# Patient Record
Sex: Male | Born: 1945
Health system: Southern US, Community
[De-identification: ages and names within clinical notes are randomized; demographics above are authoritative.]

## PROBLEM LIST (undated history)

## (undated) DIAGNOSIS — G459 Transient cerebral ischemic attack, unspecified: Secondary | ICD-10-CM

## (undated) HISTORY — PX: SHOULDER ARTHROSCOPY: SHX128

---

## 2003-11-16 ENCOUNTER — Other Ambulatory Visit: Payer: Self-pay

## 2004-06-30 ENCOUNTER — Ambulatory Visit: Payer: Self-pay | Admitting: Unknown Physician Specialty

## 2004-07-07 ENCOUNTER — Ambulatory Visit: Payer: Self-pay | Admitting: Unknown Physician Specialty

## 2004-07-15 ENCOUNTER — Other Ambulatory Visit: Payer: Self-pay

## 2004-07-16 ENCOUNTER — Inpatient Hospital Stay: Payer: Self-pay | Admitting: Surgery

## 2013-11-02 ENCOUNTER — Observation Stay: Payer: Self-pay | Admitting: Internal Medicine

## 2013-11-02 LAB — CBC WITH DIFFERENTIAL/PLATELET
Basophil #: 0 10*3/uL (ref 0.0–0.1)
Basophil %: 0.3 %
Eosinophil #: 0 10*3/uL (ref 0.0–0.7)
Eosinophil %: 0.2 %
HCT: 42.5 % (ref 40.0–52.0)
HGB: 14.6 g/dL (ref 13.0–18.0)
Lymphocyte #: 1.6 10*3/uL (ref 1.0–3.6)
Lymphocyte %: 36.1 %
MCH: 34.4 pg — ABNORMAL HIGH (ref 26.0–34.0)
MCHC: 34.3 g/dL (ref 32.0–36.0)
MCV: 100 fL (ref 80–100)
Monocyte #: 0.4 x10 3/mm (ref 0.2–1.0)
Monocyte %: 9.3 %
Neutrophil #: 2.4 10*3/uL (ref 1.4–6.5)
Neutrophil %: 54.1 %
PLATELETS: 204 10*3/uL (ref 150–440)
RBC: 4.23 10*6/uL — AB (ref 4.40–5.90)
RDW: 12.3 % (ref 11.5–14.5)
WBC: 4.5 10*3/uL (ref 3.8–10.6)

## 2013-11-02 LAB — COMPREHENSIVE METABOLIC PANEL
ANION GAP: 7 (ref 7–16)
Albumin: 4 g/dL (ref 3.4–5.0)
Alkaline Phosphatase: 22 U/L — ABNORMAL LOW
BILIRUBIN TOTAL: 0.7 mg/dL (ref 0.2–1.0)
BUN: 21 mg/dL — AB (ref 7–18)
Calcium, Total: 9 mg/dL (ref 8.5–10.1)
Chloride: 105 mmol/L (ref 98–107)
Co2: 26 mmol/L (ref 21–32)
Creatinine: 1.15 mg/dL (ref 0.60–1.30)
EGFR (Non-African Amer.): >60
Glucose: 117 mg/dL — ABNORMAL HIGH (ref 65–99)
OSMOLALITY: 280 (ref 275–301)
Potassium: 4.3 mmol/L (ref 3.5–5.1)
SGOT(AST): 41 U/L — ABNORMAL HIGH (ref 15–37)
SGPT (ALT): 46 U/L (ref 12–78)
SODIUM: 138 mmol/L (ref 136–145)
Total Protein: 7.1 g/dL (ref 6.4–8.2)

## 2013-11-02 LAB — APTT: Activated PTT: 32.1 secs (ref 23.6–35.9)

## 2013-11-03 LAB — TSH: THYROID STIMULATING HORM: 2.52 u[IU]/mL

## 2013-11-03 LAB — HEPATIC FUNCTION PANEL A (ARMC)
ALBUMIN: 3.6 g/dL (ref 3.4–5.0)
ALK PHOS: 20 U/L — AB
AST: 29 U/L (ref 15–37)
BILIRUBIN TOTAL: 0.6 mg/dL (ref 0.2–1.0)
Bilirubin, Direct: 0.1 mg/dL (ref 0.00–0.20)
SGPT (ALT): 40 U/L (ref 12–78)
TOTAL PROTEIN: 6.5 g/dL (ref 6.4–8.2)

## 2013-11-03 LAB — BASIC METABOLIC PANEL
ANION GAP: 6 — AB (ref 7–16)
BUN: 22 mg/dL — AB (ref 7–18)
CHLORIDE: 107 mmol/L (ref 98–107)
Calcium, Total: 8.8 mg/dL (ref 8.5–10.1)
Co2: 26 mmol/L (ref 21–32)
Creatinine: 1.15 mg/dL (ref 0.60–1.30)
GLUCOSE: 95 mg/dL (ref 65–99)
Osmolality: 281 (ref 275–301)
POTASSIUM: 3.8 mmol/L (ref 3.5–5.1)
Sodium: 139 mmol/L (ref 136–145)

## 2013-11-03 LAB — LIPID PANEL
CHOLESTEROL: 238 mg/dL — AB (ref 0–200)
HDL Cholesterol: 31 mg/dL — ABNORMAL LOW (ref 40–60)
LDL CHOLESTEROL, CALC: 157 mg/dL — AB (ref 0–100)
TRIGLYCERIDES: 250 mg/dL — AB (ref 0–200)
VLDL CHOLESTEROL, CALC: 50 mg/dL — AB (ref 5–40)

## 2013-11-03 LAB — HEMOGLOBIN A1C: HEMOGLOBIN A1C: 5.3 % (ref 4.2–6.3)

## 2014-06-08 ENCOUNTER — Inpatient Hospital Stay: Payer: Self-pay | Admitting: Internal Medicine

## 2014-06-08 LAB — BASIC METABOLIC PANEL
Anion Gap: 5 — ABNORMAL LOW (ref 7–16)
BUN: 34 mg/dL — AB (ref 7–18)
CHLORIDE: 107 mmol/L (ref 98–107)
Calcium, Total: 8.2 mg/dL — ABNORMAL LOW (ref 8.5–10.1)
Co2: 31 mmol/L (ref 21–32)
Creatinine: 1.25 mg/dL (ref 0.60–1.30)
EGFR (African American): >60
EGFR (Non-African Amer.): >60
Glucose: 133 mg/dL — ABNORMAL HIGH (ref 65–99)
OSMOLALITY: 295 (ref 275–301)
Potassium: 3.9 mmol/L (ref 3.5–5.1)
SODIUM: 143 mmol/L (ref 136–145)

## 2014-06-08 LAB — CBC
HCT: 26.7 % — AB (ref 40.0–52.0)
HGB: 9.2 g/dL — AB (ref 13.0–18.0)
MCH: 35.8 pg — ABNORMAL HIGH (ref 26.0–34.0)
MCHC: 34.2 g/dL (ref 32.0–36.0)
MCV: 105 fL — AB (ref 80–100)
Platelet: 224 10*3/uL (ref 150–440)
RBC: 2.56 10*6/uL — AB (ref 4.40–5.90)
RDW: 12.6 % (ref 11.5–14.5)
WBC: 6.2 10*3/uL (ref 3.8–10.6)

## 2014-06-08 LAB — PROTIME-INR
INR: 1
PROTHROMBIN TIME: 12.8 s (ref 11.5–14.7)

## 2014-06-08 LAB — HEMOGLOBIN: HGB: 8.7 g/dL — ABNORMAL LOW (ref 13.0–18.0)

## 2014-06-08 LAB — TROPONIN I: Troponin-I: <0.02 ng/mL

## 2014-06-08 LAB — APTT: Activated PTT: 26.1 secs (ref 23.6–35.9)

## 2014-06-09 LAB — CBC WITH DIFFERENTIAL/PLATELET
Basophil #: 0 10*3/uL (ref 0.0–0.1)
Basophil %: 0.1 %
EOS ABS: 0 10*3/uL (ref 0.0–0.7)
Eosinophil %: 0.3 %
HCT: 22.8 % — ABNORMAL LOW (ref 40.0–52.0)
HGB: 7.7 g/dL — ABNORMAL LOW (ref 13.0–18.0)
LYMPHS PCT: 30.2 %
Lymphocyte #: 1.5 10*3/uL (ref 1.0–3.6)
MCH: 35.1 pg — ABNORMAL HIGH (ref 26.0–34.0)
MCHC: 33.8 g/dL (ref 32.0–36.0)
MCV: 104 fL — ABNORMAL HIGH (ref 80–100)
Monocyte #: 0.4 x10 3/mm (ref 0.2–1.0)
Monocyte %: 8.7 %
NEUTROS ABS: 3.1 10*3/uL (ref 1.4–6.5)
Neutrophil %: 60.7 %
Platelet: 181 10*3/uL (ref 150–440)
RBC: 2.2 10*6/uL — AB (ref 4.40–5.90)
RDW: 12.8 % (ref 11.5–14.5)
WBC: 5.1 10*3/uL (ref 3.8–10.6)

## 2014-06-09 LAB — BASIC METABOLIC PANEL
Anion Gap: 7 (ref 7–16)
BUN: 28 mg/dL — AB (ref 7–18)
CALCIUM: 8.2 mg/dL — AB (ref 8.5–10.1)
CHLORIDE: 111 mmol/L — AB (ref 98–107)
CO2: 25 mmol/L (ref 21–32)
Creatinine: 1.15 mg/dL (ref 0.60–1.30)
EGFR (African American): >60
GLUCOSE: 96 mg/dL (ref 65–99)
OSMOLALITY: 290 (ref 275–301)
Potassium: 3.9 mmol/L (ref 3.5–5.1)
Sodium: 143 mmol/L (ref 136–145)

## 2014-06-09 LAB — HEMATOCRIT
HCT: 22.5 % — ABNORMAL LOW (ref 40.0–52.0)
HCT: 20.7 % — ABNORMAL LOW (ref 40.0–52.0)

## 2014-06-09 LAB — HEMOGLOBIN
HGB: 7.4 g/dL — ABNORMAL LOW (ref 13.0–18.0)
HGB: 7.4 g/dL — ABNORMAL LOW (ref 13.0–18.0)
HGB: 6.8 g/dL — AB (ref 13.0–18.0)

## 2014-06-10 LAB — BASIC METABOLIC PANEL
ANION GAP: 7 (ref 7–16)
BUN: 17 mg/dL (ref 7–18)
CHLORIDE: 114 mmol/L — AB (ref 98–107)
Calcium, Total: 7.5 mg/dL — ABNORMAL LOW (ref 8.5–10.1)
Co2: 25 mmol/L (ref 21–32)
Creatinine: 1.15 mg/dL (ref 0.60–1.30)
EGFR (African American): >60
EGFR (Non-African Amer.): >60
Glucose: 81 mg/dL (ref 65–99)
OSMOLALITY: 291 (ref 275–301)
POTASSIUM: 4 mmol/L (ref 3.5–5.1)
Sodium: 146 mmol/L — ABNORMAL HIGH (ref 136–145)

## 2014-06-10 LAB — CBC WITH DIFFERENTIAL/PLATELET
BASOS PCT: 0.2 %
Basophil #: 0 10*3/uL (ref 0.0–0.1)
EOS ABS: 0 10*3/uL (ref 0.0–0.7)
EOS PCT: 0.6 %
HCT: 20.3 % — AB (ref 40.0–52.0)
HGB: 6.9 g/dL — ABNORMAL LOW (ref 13.0–18.0)
LYMPHS PCT: 30.5 %
Lymphocyte #: 1.5 10*3/uL (ref 1.0–3.6)
MCH: 35.9 pg — ABNORMAL HIGH (ref 26.0–34.0)
MCHC: 34.2 g/dL (ref 32.0–36.0)
MCV: 105 fL — ABNORMAL HIGH (ref 80–100)
Monocyte #: 0.4 x10 3/mm (ref 0.2–1.0)
Monocyte %: 7.4 %
NEUTROS PCT: 61.3 %
Neutrophil #: 3 10*3/uL (ref 1.4–6.5)
Platelet: 183 10*3/uL (ref 150–440)
RBC: 1.93 10*6/uL — AB (ref 4.40–5.90)
RDW: 12.8 % (ref 11.5–14.5)
WBC: 4.9 10*3/uL (ref 3.8–10.6)

## 2014-06-10 LAB — HEMOGLOBIN: HGB: 8.3 g/dL — AB (ref 13.0–18.0)

## 2014-06-10 LAB — HEMATOCRIT: HCT: 24.4 % — ABNORMAL LOW (ref 40.0–52.0)

## 2014-06-11 LAB — CBC WITH DIFFERENTIAL/PLATELET
Basophil #: 0 10*3/uL (ref 0.0–0.1)
Basophil %: 0.2 %
EOS ABS: 0 10*3/uL (ref 0.0–0.7)
EOS PCT: 1 %
HCT: 22.1 % — ABNORMAL LOW (ref 40.0–52.0)
HGB: 7.6 g/dL — AB (ref 13.0–18.0)
LYMPHS ABS: 1.5 10*3/uL (ref 1.0–3.6)
LYMPHS PCT: 32.2 %
MCH: 33.7 pg (ref 26.0–34.0)
MCHC: 34.4 g/dL (ref 32.0–36.0)
MCV: 98 fL (ref 80–100)
MONOS PCT: 7.2 %
Monocyte #: 0.3 x10 3/mm (ref 0.2–1.0)
NEUTROS PCT: 59.4 %
Neutrophil #: 2.8 10*3/uL (ref 1.4–6.5)
Platelet: 195 10*3/uL (ref 150–440)
RBC: 2.26 10*6/uL — AB (ref 4.40–5.90)
RDW: 18.3 % — ABNORMAL HIGH (ref 11.5–14.5)
WBC: 4.7 10*3/uL (ref 3.8–10.6)

## 2014-06-11 LAB — BASIC METABOLIC PANEL
Anion Gap: 6 — ABNORMAL LOW (ref 7–16)
BUN: 16 mg/dL (ref 7–18)
Calcium, Total: 7.4 mg/dL — ABNORMAL LOW (ref 8.5–10.1)
Chloride: 113 mmol/L — ABNORMAL HIGH (ref 98–107)
Co2: 26 mmol/L (ref 21–32)
Creatinine: 1.09 mg/dL (ref 0.60–1.30)
EGFR (Non-African Amer.): >60
Glucose: 101 mg/dL — ABNORMAL HIGH (ref 65–99)
Osmolality: 290 (ref 275–301)
Potassium: 3.7 mmol/L (ref 3.5–5.1)
Sodium: 145 mmol/L (ref 136–145)

## 2014-06-12 LAB — CBC WITH DIFFERENTIAL/PLATELET
BASOS ABS: 0 10*3/uL (ref 0.0–0.1)
Basophil %: 0.2 %
EOS ABS: 0.1 10*3/uL (ref 0.0–0.7)
EOS PCT: 1.5 %
HCT: 21.4 % — ABNORMAL LOW (ref 40.0–52.0)
HGB: 7.2 g/dL — AB (ref 13.0–18.0)
LYMPHS ABS: 1.2 10*3/uL (ref 1.0–3.6)
Lymphocyte %: 23.7 %
MCH: 33.8 pg (ref 26.0–34.0)
MCHC: 33.7 g/dL (ref 32.0–36.0)
MCV: 101 fL — ABNORMAL HIGH (ref 80–100)
MONOS PCT: 7.5 %
Monocyte #: 0.4 x10 3/mm (ref 0.2–1.0)
NEUTROS ABS: 3.5 10*3/uL (ref 1.4–6.5)
Neutrophil %: 67.1 %
PLATELETS: 203 10*3/uL (ref 150–440)
RBC: 2.13 10*6/uL — AB (ref 4.40–5.90)
RDW: 17.1 % — AB (ref 11.5–14.5)
WBC: 5.2 10*3/uL (ref 3.8–10.6)

## 2014-06-12 LAB — HEMOGLOBIN: HGB: 7.1 g/dL — ABNORMAL LOW (ref 13.0–18.0)

## 2014-06-15 ENCOUNTER — Ambulatory Visit: Payer: Self-pay | Admitting: Internal Medicine

## 2014-06-15 LAB — HEMOGLOBIN: HGB: 9.1 g/dL — ABNORMAL LOW (ref 13.0–18.0)

## 2014-06-15 LAB — HEMATOCRIT: HCT: 27.6 % — ABNORMAL LOW (ref 40.0–52.0)

## 2014-08-14 ENCOUNTER — Ambulatory Visit: Payer: Self-pay | Admitting: Gastroenterology

## 2014-08-14 LAB — CBC WITH DIFFERENTIAL/PLATELET
Basophil #: 0 10*3/uL (ref 0.0–0.1)
Basophil %: 0.3 %
Eosinophil #: 0 10*3/uL (ref 0.0–0.7)
Eosinophil %: 0.8 %
HCT: 39.2 % — ABNORMAL LOW (ref 40.0–52.0)
HGB: 12.5 g/dL — AB (ref 13.0–18.0)
LYMPHS PCT: 30.6 %
Lymphocyte #: 1 10*3/uL (ref 1.0–3.6)
MCH: 30.2 pg (ref 26.0–34.0)
MCHC: 32 g/dL (ref 32.0–36.0)
MCV: 94 fL (ref 80–100)
MONO ABS: 0.3 x10 3/mm (ref 0.2–1.0)
Monocyte %: 9.4 %
NEUTROS PCT: 58.9 %
Neutrophil #: 1.9 10*3/uL (ref 1.4–6.5)
PLATELETS: 200 10*3/uL (ref 150–440)
RBC: 4.15 10*6/uL — ABNORMAL LOW (ref 4.40–5.90)
RDW: 14.4 % (ref 11.5–14.5)
WBC: 3.2 10*3/uL — ABNORMAL LOW (ref 3.8–10.6)

## 2014-08-14 LAB — IRON AND TIBC
IRON: 59 ug/dL — AB (ref 65–175)
Iron Bind.Cap.(Total): 366 ug/dL (ref 250–450)
Iron Saturation: 16 %
Unbound Iron-Bind.Cap.: 307 ug/dL

## 2014-08-24 ENCOUNTER — Ambulatory Visit: Payer: Self-pay | Admitting: Gastroenterology

## 2014-11-10 NOTE — H&P (Signed)
PATIENT NAME:  Ruben [name], Ruben [name] MR#:  [id number] DATE OF BIRTH:  [birth year]  DATE OF ADMISSION:  06/08/2014  PRIMARY CARE PHYSICIAN:  Dr. Clemmie Krill.    HISTORY OF PRESENT ILLNESS:  The patient is a 69 year old Caucasian male with past medical history significant for history of recent diagnosis of TIA for which he was admitted to the hospital in April 2015, presented back to the hospital with complaints of tachycardia as well as gastrointestinal bleed.  According to the patient he was doing well up until approximately 2 days ago when he started having very dark-looking stool.  He noted stool being almost black.  The next day his stools became a little lighter, but they still looked quite dark. Over the next few days he developed significant tachycardia especially whenever he stands up or walks around, he would become lightheaded or dizzy, almost passed out. On arrival to the hospital he was noted to be anemic with hemoglobin dropped from 14.6 in April 2015 to 9.2 today on 06/08/2014. He was also noted to be tachycardic with sinus tachycardia on EKG. Hospitalist services were contacted for admission due to gastrointestinal bleed. Apparently the patient has been taking Aleve for a while, has been taking for years at least twice a week. He was diagnosed with TIA and was initiated on low dose of aspirin in April 2015. He denies any bright red blood per rectum. Denies any vomiting. Admits to having some nausea. He tells me that over the past few days he is not eating that much. He denies any diarrheal stool.   PAST MEDICAL HISTORY: Significant for history of diagnosis of TIA with expressive aphasia in April 2015 on which he was discharged on aspirin therapy.  Past medical history is also significant for history of elevated blood pressure during the episode of TIA, history of hyperlipidemia with LDL 157 in April 2015, as well as hypertriglyceridemia with a level of 250, also hyperglycemia in nonfasting specimen which  resolved subsequently.   MEDICATIONS: Aspirin 81 mg p.o. daily, atorvastatin 10 mg p.o. daily, naproxen 250 mg twice daily, and Tylenol PM 500/25 two tablets at bedtime.   ALLERGIES: None.   SOCIAL HISTORY: Quit smoking 4 years ago. No alcohol or drug abuse.   FAMILY HISTORY: Coronary artery disease in the patient's father.    REVIEW OF SYSTEMS:   CONSTITUTIONAL:  Positive for black-looking stool, tachycardia, feeling presyncopal whenever he stands up. Admits to having fatigue and weakness for the past few days. Admits to having some weight loss during the past few days because of poor appetite. Some palpitations, feeling presyncopal, nausea, and rectal bleeding. Denies any high fevers or chills, pains, or weight gain.  EYES: Denies any blurry vision, double vision, glaucoma, or cataracts.  EARS, NOSE, AND THROAT:  Denies any tinnitus, allergies, epistaxis, sinus pain, dentures, difficulty swallowing.   RESPIRATORY:  Denies  any cough, wheezes, asthma, or COPD.   CARDIOVASCULAR: No chest pains, orthopnea, edema, arrhythmias.  GASTROINTESTINAL: Denies any vomiting, abdominal pain, change in bowel habits.  GENITOURINARY: Denies dysuria, hematuria, frequency, incontinence.   ENDOCRINE: Denies any polydipsia, nocturia, thyroid problems, heat or cold intolerance or thirst.  HEMATOLOGIC: Denies anemia, easy bruising.  Admits to  bleeding. No swollen glands.  SKIN: Denies any acne, rashes, change in moles.  MUSCULOSKELETAL: Denies arthritis, cramps, swelling.  NEUROLOGIC: Denies numbness, epilepsy, tremors.   PSYCHIATRIC:  Denies anxiety, insomnia, depression.    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   PHYSICAL EXAMINATION:   VITAL SIGNS:  On arrival to the Emergency Room the patient's vital signs, temperature is 98.3, pulse 120,  respirations were 18, blood pressure 112/59, saturation was 99% on room air.  GENERAL: A well-developed, well-nourished, thin, and somewhat pale Caucasian male, lying on the stretcher.  HEENT:  His pupils are equal and reactive to light. Extraocular muscles intact. No icterus or conjunctivitis. Has normal hearing. No pharyngeal erythema. Mucosa is dry.  NECK: No masses. Supple, nontender. Thyroid not enlarged. No adenopathy. No JVD or carotid bruits bilaterally. Full range of motion.  LUNGS: Clear to auscultation in all fields. No rales, rhonchi, diminished breath sounds, or wheezing. No labored inspirations, increased effort, dullness to percussion, or overt respiratory distress.  CARDIOVASCULAR: S1, S2 appreciated. Rhythm was regular, not tachycardic. PMI not lateralized.  Chest is nontender to palpation. 1 + pedal pulses.  EXTREMITIES: No lower extremity edema, calf tenderness, or cyanosis was noted.  ABDOMEN: Soft, nontender. Bowel sounds are present. No hepatosplenomegaly or masses were noted.  RECTAL: Deferred.  MUSCLE STRENGTH: Able to move all extremities. No cyanosis, degenerative joint disease, or kyphosis. Gait was not tested.  SKIN: Did not reveal any rashes, lesions, erythema, nodularity, or induration. It was warm and dry to palpation.  LYMPHATIC: No adenopathy in the cervical region.  NEUROLOGICAL: Cranial nerves grossly intact. Sensory is intact. No dysarthria or aphasia. The patient is alert and oriented to time, person, and place, cooperative. Memory is good.  No significant confusion, agitation, or depression noted.   LABORATORIES: Glucose of 133, BUN of 34, otherwise BMP unremarkable. The patient's troponin less than 0.02. White blood cell count is normal at 6.2, hemoglobin 9.2, platelet count 224,000. Coagulation panel, pro time 12.8, INR 1.0, activated PTT 26.1.   RADIOLOGIC STUDIES: None.   ASSESSMENT AND PLAN:  1.  Gastrointestinal bleed likely upper due to long-standing  Aleve use as well as recent use of aspirin therapy. Admit the patient to the medical floor. Start him on Protonix IV twice daily.  Get gastroenterology consultation for EGD as well as likely a colonoscopy in the near future, as it was scheduled for Wednesday or Thursday coming week by Dr. Vira Agar.  2.  Acute posthemorrhagic anemia. Discussed risks as well as benefits. We will transfuse patient as needed. Hemoglobin will be checked every 6 hours.  We will rehydrate the patient.  4.  Orthostatic dizziness. We will continue IV fluids.  5.  Tachycardia. Follow with rehydration and transfusion.  6.  History of transient ischemic attack. We will hold aspirin therapy. Continue Lipitor.   TIME SPENT: 50 minutes.    ____________________________ Theodoro Grist, MD rv:bu D: 06/08/2014 17:08:00 ET T: 06/08/2014 18:59:46 ET JOB#: [id number]  cc: Theodoro Grist, MD, <Dictator> Valetta Close, MD [place] MD ELECTRONICALLY SIGNED 06/18/2014 21:15

## 2014-11-10 NOTE — Consult Note (Signed)
Brief Consult Note: Diagnosis: black stool/melena/anemia in the setting of nsaid use.   Patient was seen by consultant.   Consult note dictated.   Recommend further assessment or treatment.   Comments: Please see full GI consult [phone].  Patient admitted with 2 days of dark stoola, found to have anemia.  Hemodynamically stable after hydration, no recurrent bleeding.   continue ppi iv bid as you are.  EGD monday unless change of clinical status in the interim.  I have discussed the risks benefits adn complications of egd to include not limited to bleeding infection perforation and sedation and he wishes to proceed.  Following.  will allow limited clears, no red or carbonation.  Electronic Signatures: Loistine Simas (MD)  (Signed 21-Nov-15 14:08)  Authored: Brief Consult Note   Last Updated: 21-Nov-15 14:08 by Loistine Simas (MD)

## 2014-11-10 NOTE — Consult Note (Signed)
PATIENT NAME:  DEMARCO, BACCI MR#:  [id number] DATE OF BIRTH:  03-Apr-1946  DATE OF CONSULTATION:  06/08/2014  REFERRING PHYSICIAN:   CONSULTING PHYSICIAN:  Lollie Sails, MD  Patient of Dr. Ether Griffins.  REASON FOR CONSULTATION: GI bleed.   HISTORY OF PRESENT ILLNESS: Mr. Kendall is a 69 year old Caucasian male who was in his usual state of health until about Wednesday. He stated that several days before that he had some work around the home including some painting over his head and developed a sore neck and back. Subsequently he began to take some Aleve. He is also taking an 81 mg aspirin daily. He may take Aleve a couple of times a week regularly. He stated that Wednesday morning, he experienced a dark stool; this repeated over the course of Thursday and Friday. Friday he noted an increased heart rate when he tried to stand up as well as some occasional dizziness. There was also some mild nausea. He came to the Emergency Room. He had a near syncopal episode in the Emergency Room. He was found to be anemic consistent with his history and GI bleeding and was admitted to the hospital. He states that he has not had any nausea with the exception of that noted above. There has been no vomiting. No abdominal pain. There is no heartburn or dysphagia. He has not had any recurrent dark stools since admission last night. He is hemodynamically stable.  GASTROINTESTINAL FAMILY HISTORY: Pertinent for mother with colon cancer. It is of note, the patient has arrangements to have a colonoscopy this coming Wednesday. There is no family history of liver disease or peptic ulcer disease to his knowledge.   PAST MEDICAL HISTORY: TIA with some expressive aphasia in 2015. He has been on low dose aspirin therapy since then. He has a history of a Meckel's diverticulum requiring surgery in the late 1990s. In 2005, he experienced a small bowel obstruction requiring a repeat abdominal surgery. He has some  hypercholesterolemia. There is some occasional irregular heartbeat. He has not been placed on any medication in that regard.   OUTPATIENT MEDICATIONS: Have included 81 mg aspirin, Aleve 250 mg twice a day, Tylenol PM, and atorvastatin 10 mg daily.  ALLERGIES: There are no known drug allergy.  REVIEW OF SYSTEMS: Ten systems reviewed per admission history and physical, agree with same.   SOCIAL HISTORY: Currently a nonsmoker, quit 4 years ago. He does not use alcohol or drugs.   PHYSICAL EXAMINATION: VITAL SIGNS: Temperature is 98.7, pulse 88, respirations 18, blood pressure 122/61, pulse oximetry 96%.  GENERAL: Well-appearing 69 year old Caucasian male no acute distress.  HEENT: Normocephalic, atraumatic. Eyes are anicteric. Nose: Septum midline. No lesions. Oropharynx: No lesions.  NECK: Supple. No JVD.  HEART: Regular rate and rhythm.  LUNGS: Clear.  ABDOMEN: Soft, nontender, nondistended. Bowel sounds positive, normoactive.  RECTAL: Anorectal examination in the ER was heme-positive. There is no clubbing, cyanosis, or edema.  NEUROLOGICAL: Cranial nerves II through XII grossly intact. Muscle strength bilaterally equal and symmetric.  LABORATORY, DIAGNOSTIC, AND RADIOLOGICAL DATA: Include the following: Yesterday afternoon he had labs including a BUN of 34, creatinine 1.25, sodium 143, potassium 3.9, chloride 107, bicarbonate 31, troponin I x1 less than 0.02. Hemogram showing a white count of 6.2, hemoglobin and hematocrit 9.2 and 26.7, platelet count of 224,000, MCV 105. He has had 3 hemoglobins since then 8.7, 7.7, and 7.4 respectively following hydration. He has been typed and crossed. His INR is 1.0. There has been no imaging.  ASSESSMENT: Black stool/ gastrointestinal bleeding/acute blood loss anemia. The patient is hemodynamically stable. There has been no repeat bowel movement since yesterday. Per history, this is quite likely non-steroidal anti-inflammatory drug-related gastritis  versus ulceration.   RECOMMENDATION: Esophagogastroduodenoscopy. We will proceed with this on Monday, unless there is a change of clinical condition. I have discussed the risks, benefits, and complications of esophagogastroduodenoscopy to include, but not limited to bleeding, infection, perforation, and the risk of sedation, and he wishes to proceed. In the interim, would continue him on intravenous proton pump inhibitor as you are. I will allow him a limited clear liquid diet. No red and no carbonation. In regards to his colonoscopy, that is arranged for Wednesday. Continuance of those plans will depend on the findings on EGD. It would not be advisable to do both at the same time since we do not know the nature of the bleeding lesion in the upper. We will follow with you.    ____________________________ Lollie Sails, MD mus:sw D: 06/09/2014 14:04:48 ET T: 06/09/2014 14:27:56 ET JOB#: [id number]  cc: Lollie Sails, MD, <Dictator> Lollie Sails MD ELECTRONICALLY SIGNED 07/10/2014 1:40

## 2014-11-10 NOTE — H&P (Signed)
PATIENT NAME:  Ruben [name], Ruben [name] MR#:  [id number] DATE OF BIRTH:  1945-12-30  DATE OF ADMISSION:  11/02/2013   PRIMARY CARE PROVIDER: Dr. Bernie Covey.   EMERGENCY DEPARTMENT REFERRING PHYSICIAN: Dr. Mariea Clonts.   CHIEF COMPLAINT:  Difficulty with speech.   HISTORY OF PRESENT ILLNESS: The patient is a 69 year old white male. States that he has no previous medical problems. He has history of slightly elevated blood pressure and cholesterol, which he reports that he controlled with diet and exercise. Has not had this recently checked, who was at [place] with his son and was walking out when all of a sudden he started having stuttering speech. He could not say what he wanted to say. His son states that it was hard to understand what he was saying. The patient did not have any other symptoms of asymmetrical weakness or any other visual difficulties. The patient has never had these symptoms in the past.   PAST MEDICAL HISTORY:  None.   ALLERGIES: None.   MEDICATIONS:  He is on Tylenol PM 2 tabs at bedtime.   SOCIAL HISTORY: History of smoking, quit 4 years ago. No alcohol or drug use.   FAMILY HISTORY: Father had coronary artery disease.     REVIEW OF SYSTEMS:   CONSTITUTIONAL: Denies any fevers, fatigue, weakness, pain, weight loss, weight gain.  EYES: No blurred or double vision. No pain. No redness. No inflammation. No glaucoma. No cataracts.  ENT: No tinnitus. No ear pain. No hearing loss. No seasonal or year-round allergies. No epistaxis. No nasal discharge. No difficulty swallowing.  RESPIRATORY: Denies any cough, wheezing, hemoptysis. No dyspnea or asthma.  CARDIOVASCULAR: Denies any chest pain, orthopnea, edema or arrhythmia.  GASTROINTESTINAL: No nausea, vomiting, diarrhea. No abdominal pain. No hematemesis. No melena. No ulcer.  GENITOURINARY: Denies any dysuria, hematuria, renal calc or frequency.  ENDOCRINE: Denies any polyuria, nocturia or thyroid problems.  HEMATOLOGIC AND  LYMPHATIC: Denies anemia, easy bruisability or bleeding.  SKIN: No acne. No rash. No changes in mole, hair or skin.  MUSCULOSKELETAL: Denies any pain in the neck, back or shoulder.  NEUROLOGIC: No numbness, weakness. No CVA. No TIAs in the past.  PSYCHIATRIC: No anxiety, insomnia,   PHYSICAL EXAMINATION: VITAL SIGNS: Temperature 97.9, pulse 82, respirations 20, blood pressure on presentation was 168/88, O2 of 99%.  GENERAL: The patient is a well-developed, well-nourished male in no acute distress.  HEENT: Head atraumatic, normocephalic. Pupils equally round, reactive to light and accommodation. There is no conjunctival pallor. No scleral icterus. Nasal exam shows no drainage or ulceration.  Oropharynx is clear without any exudate.  NECK: Supple without any JVD.  CARDIOVASCULAR: Regular rate and rhythm. No murmurs, rubs, clicks or gallops.  LUNGS: Clear to auscultation bilaterally without any rales, rhonchi, wheezing.  ABDOMEN: Soft, nontender, nondistended. Positive bowel sounds x 4.  EXTREMITIES: No clubbing, cyanosis or edema.  SKIN: No rash.  LYMPHATICS: No lymph nodes palpable.  VASCULAR: Good DP, PT pulses.  PSYCHIATRIC: Not anxious or depressed.  NEUROLOGIC: Awake, alert, oriented x 3. No focal deficits.   EVALUATIONS: CT scan of the head shows age-related volume loss, small vessel disease. No other abnormalities. EKG: Normal sinus rhythm without any ST-T wave changes. Glucose 115, BUN 21, creatinine 1.15, sodium 138, potassium 4.3, chloride 105. CO2 is 26, calcium 9.0. LFTs are normal, except alk phos of 22, AST 41, ALT  46. WBC 4.5, hemoglobin 14.6, platelet count 204.   ASSESSMENT AND PLAN: The patient is a 69 year old white male with  history of no medical problems, who presents with episode of expressive aphasia lasting 10 minutes.  1. Expressive aphasia, likely due to a transient ischemic attack. At this time, we will check carotid Dopplers, echocardiogram of the heart, place him  on aspirin. If symptoms persist or recur, then we will need to get MRI of the brain.  2.  Miscellaneous: We will place him on Lovenox for deep vein thrombosis prophylaxis.     TIME SPENT: 45 minutes on this H and P.    ____________________________ Chana Bode H. Posey Pronto, MD shp:dmm D: 11/02/2013 19:55:00 ET T: 11/02/2013 20:22:44 ET JOB#: [id number]  cc: Cartier Mapel H. Posey Pronto, MD, <Dictator> Alric Seton MD ELECTRONICALLY SIGNED 11/03/2013 13:53

## 2014-11-10 NOTE — Consult Note (Signed)
Chief Complaint:  Subjective/Chief Complaint seen for melena, GIB.   stable overnight, denies n/v or abdominal pain.  no bm since admission.   VITAL SIGNS/ANCILLARY NOTES: **Vital Signs.:   23-Nov-15 12:38  Vital Signs Type Pre-Procedure  Temperature Temperature (F) 98.8  Celsius 37.1  Temperature Source oral  Pulse Pulse 87  Systolic BP Systolic BP 252  Diastolic BP (mmHg) Diastolic BP (mmHg) 64  Mean BP 81  Pulse Ox % Pulse Ox % 99   Brief Assessment:  Cardiac Regular   Respiratory clear BS   Gastrointestinal details normal Soft  Nontender  Nondistended  No masses palpable  Bowel sounds normal   Lab Results: Routine Chem:  23-Nov-15 01:01   Glucose, Serum  101  BUN 16  Creatinine (comp) 1.09  Sodium, Serum 145  Potassium, Serum 3.7  Chloride, Serum  113  CO2, Serum 26  Calcium (Total), Serum  7.4  Anion Gap  6  Osmolality (calc) 290  eGFR (African American) >60  eGFR (Non-African American) >60 (eGFR values <30m/min/1.73 m2 may be an indication of chronic kidney disease (CKD). Calculated eGFR, using the MRDR Study equation, is useful in  patients with stable renal function. The eGFR calculation will not be reliable in acutely ill patients when serum creatinine is changing rapidly. It is not useful in patients on dialysis. The eGFR calculation may not be applicable to patients at the low and high extremes of body sizes, pregnant women, and vegetarians.)  Routine Hem:  23-Nov-15 01:01   WBC (CBC) 4.7  RBC (CBC)  2.26  Hemoglobin (CBC)  7.6  Hematocrit (CBC)  22.1  Platelet Count (CBC) 195  MCV 98  MCH 33.7  MCHC 34.4  RDW  18.3  Neutrophil % 59.4  Lymphocyte % 32.2  Monocyte % 7.2  Eosinophil % 1.0  Basophil % 0.2  Neutrophil # 2.8  Lymphocyte # 1.5  Monocyte # 0.3  Eosinophil # 0.0  Basophil # 0.0 (Result(s) reported on 11 Jun 2014 at 01:19AM.)   Assessment/Plan:  Assessment/Plan:  Assessment 1) melena-not recurrent since admission. stable.    Plan 1) for egd today.  I have discussed the risks benefits and complications of egd to include not limited to bleeding infection perforationa and sedation and he wishes to proceed.  further recs to follow.   Electronic Signatures: SLoistine Simas(MD)  (Signed [id number])  Authored: Chief Complaint, VITAL SIGNS/ANCILLARY NOTES, Brief Assessment, Lab Results, Assessment/Plan   Last Updated: 23-Nov-15 13:09 by SLoistine Simas(MD)

## 2014-11-10 NOTE — Discharge Summary (Signed)
PATIENT NAME:  Ruben [name], Ruben [name] MR#:  [id number] DATE OF BIRTH:  [birth year]  DATE OF ADMISSION:  06/08/2014 DATE OF DISCHARGE:  06/12/2014  CONSULTATIONS:  Gastroenterology, Dr. Gustavo Lah  PROCEDURES:  Esophagogastroduodenoscopy on November 23.   PRIMARY CARE PHYSICIAN:  Valetta Close, MD   ADMITTING DIAGNOSES:  1.  Gastrointestinal bleed, likely upper, from longstanding nonsteroidal anti-inflammatory drug use as well as aspirin use.  2.  Acute posthemorrhagic anemia.  3.  Orthostatic dizziness.  4.  Tachycardia.  5.  History of transient ischemic attack.   DISCHARGE DIAGNOSES: 1.  Gastrointestinal bleed, probably upper, from nonsteroidal anti-inflammatory drug use, which has caused nonsteroidal anti-inflammatory drug-induced gastritis.  2.  Acute posthemorrhagic anemia status post esophagogastroduodenoscopy.  3.  Orthostatic dizziness resolved with IV fluids.  4.  Tachycardia is improved with IV fluids.  5.  History of transient ischemic attack. GI has recommended holding off on the aspirin in view of her nonsteroidal anti-inflammatory drug-induced upper gastrointestinal bleed. Plan is to continue Lipitor.   BRIEF HISTORY AND PHYSICAL:  The patient is a 69 year old Caucasian male who came in to the ED with a chief complaint of dark-looking stools for the past 2 days. Please review history and physical for details. The patient was admitted to the hospital with a chief complaint of GI bleed, probably upper GI secondary to prolonged use of Aleve, as well as recent start of aspirin. The patient was given Protonix IV, and his hemoglobin was monitored closely. He was started on Protonix.   HOSPITAL COURSE:  Gastrointestinal bleed, probably upper, thought to be from nonsteroidal anti-inflammatory drug-induced. Aleve was discontinued. Aspirin was held. Dr. Gustavo Lah of gastroenterology was consulted. Hemoglobin was monitored continuously. The patient received 1 unit of blood transfusion, as  hemoglobin trended down. Protonix IV was continued. IV fluids were given. In fact, the patient was evaluated by Dr. Vira Agar in the past, and colonoscopy was scheduled for the upcoming week. In the past 2 days, the patient did not have any hematemesis or any bowel movement, but hemoglobin trended down from 9.2 at the time of admission to 6.8 on November 21. The patient was given 1 unit of blood transfusion. Subsequently, his hemoglobin went up to 8.3 on November 22 and then started dropping down, so the patient had EGD done on November 23 by Dr. Gustavo Lah. EGD has revealed hiatal hernia and erosive gastritis, which was biopsied. Gastric ulcers were noted with clean base, erythematous  widely patent and nonobstructing Schatzki ring. He has recommended the patient to take Protonix 40 mg twice a day for 4 weeks and then once a day. Follow up with him in 2 weeks, and repeat EGD was recommended in 7 weeks. The patient was advised not to take any aspirin, ibuprofen, or any kind of nonsteroidal anti-inflammatory drug. Dr. Gustavo Lah can consider resuming baby aspirin if the patient's repeat hemoglobin on November 27, Friday, is in the reasonable range. Following the procedure, the patient had 2 melenic bowel movements that night on November 23. The patient had reported that the first bowel movement was very dark and tarry, but the second bowel movement was less dark. The patient's hemoglobin on November 24 was at 7.2. Repeat was at 7.1. This was discussed with Dr. Gustavo Lah in detail. He has recommended not to use any nonsteroidal anti-inflammatory drugs and to give one more unit of blood transfusion. The patient has to get his repeat hemoglobin and hematocrit checked on November 27, Friday, at the hospital; the result needs to be  called to Dr. Gustavo Lah by the lab technician, and further management will be by Dr. Gustavo Lah.   The patient's dizziness was resolved. He was complaining of weakness, but he has very good family  support. He expressed to go home for Thanksgiving. Dr. Gustavo Lah has cleared him to be discharged from a GI standpoint.   CONDITION AT THE TIME OF DISCHARGE:  Stable.   ACTIVITY:  As tolerated.   DIET:  Regular consistency.   FOLLOWUP:  With Dr. Gustavo Lah in 2 weeks and primary care physician in a week. Repeat EGD in 7 weeks. Repeat hemoglobin and hematocrit on November 27 in the hospital at the outpatient lab, and Dr. Gustavo Lah needs to be paged with the results. If the hemoglobin is stable at that time, Dr. Gustavo Lah can consider resuming baby aspirin.   MEDICATIONS AT THE TIME OF DISCHARGE:  Tylenol PM 500/25 two tablets p.o. at bedtime, atorvastatin 20 mg p.o. at bedtime, Protonix 40 mg 1 tablet p.o. 2 times a day for 4 weeks and then once daily, iron sulfate 325 mg 2 times a day, Colace 100 mg 1 capsule once daily at bedtime.   LABORATORY DATA AND IMAGING STUDIES:  On November 24, WBC was normal, hemoglobin 7.2 and repeat was at 7.1, hematocrit 21.4, and platelet count was 101,000. EGD biopsy results to be followed by gastroenterology. On November 24, BMP showed BUN, creatinine, sodium, and potassium were normal. Anion gap was at 6, GFR greater than 60, serum osmolality 290, and calcium 7.4. Troponin was less than 0.02.   The diagnosis and plan of care was discussed in detail with the patient and his wife at bedside. They both verbalized understanding of the plan.   TOTAL TIME SPENT ON DISCHARGE:  45 minutes.     ____________________________ Nicholes Mango, MD ag:nb D: 06/14/2014 17:54:33 ET T: 06/14/2014 22:48:29 ET JOB#: [id number]  cc: Nicholes Mango, MD, <Dictator> Lollie Sails, MD L. Bernie Covey, MD    Nicholes Mango MD ELECTRONICALLY SIGNED 07/01/2014 21:35

## 2014-11-10 NOTE — Consult Note (Signed)
Chief Complaint:  Subjective/Chief Complaint Patietn notified by telephone of lab result-improved HGB, 9.1, no questions. Follow  up GI as scheduled.   Electronic Signatures: Loistine Simas (MD)  (Signed [phone] 13:12)  Authored: Chief Complaint   Last Updated: 27-Nov-15 13:12 by Loistine Simas (MD)

## 2014-11-10 NOTE — Consult Note (Signed)
Chief Complaint:  Subjective/Chief Complaint seen for melena.  further drop of hgb overnight, no change of hemodynamics, no bm for 2 days, denies abd pain or emesis.   VITAL SIGNS/ANCILLARY NOTES: **Vital Signs.:   22-Nov-15 11:35  Vital Signs Type Blood Transfusion Complete  Temperature Temperature (F) 98.7  Celsius 37  Temperature Source oral  Pulse Pulse 77  Respirations Respirations 20  Systolic BP Systolic BP 034  Diastolic BP (mmHg) Diastolic BP (mmHg) 54  Mean BP 71  BP Source  if not from Vital Sign Device non-invasive  Pulse Ox % Pulse Ox % 100  Oxygen Delivery Room Air/ 21 %   Brief Assessment:  Cardiac Regular   Respiratory clear BS   Gastrointestinal details normal Soft  Nontender  Nondistended  No masses palpable  Bowel sounds normal   Lab Results: Routine Chem:  22-Nov-15 04:57   Glucose, Serum 81  BUN 17  Creatinine (comp) 1.15  Sodium, Serum  146  Potassium, Serum 4.0  Chloride, Serum  114  CO2, Serum 25  Calcium (Total), Serum  7.5  Anion Gap 7  Osmolality (calc) 291  eGFR (African American) >60  eGFR (Non-African American) >60 (eGFR values <107m/min/1.73 m2 may be an indication of chronic kidney disease (CKD). Calculated eGFR, using the MRDR Study equation, is useful in  patients with stable renal function. The eGFR calculation will not be reliable in acutely ill patients when serum creatinine is changing rapidly. It is not useful in patients on dialysis. The eGFR calculation may not be applicable to patients at the low and high extremes of body sizes, pregnant women, and vegetarians.)  Routine Hem:  20-Nov-15 14:19   Hemoglobin (CBC)  9.2    20:22   Hemoglobin (CBC)  8.7 (Result(s) reported on 08 Jun 2014 at 08:43PM.)  21-Nov-15 02:26   Hemoglobin (CBC)  7.7    08:23   Hemoglobin (CBC)  7.4 (Result(s) reported on 09 Jun 2014 at 08:38AM.)    15:14   Hemoglobin (CBC)  7.4 (Result(s) reported on 09 Jun 2014 at 03:30PM.)    23:16    Hemoglobin (CBC)  6.8 (Result(s) reported on 09 Jun 2014 at 11:29PM.)  22-Nov-15 04:57   WBC (CBC) 4.9  RBC (CBC)  1.93  Hemoglobin (CBC)  6.9  Hematocrit (CBC)  20.3  Platelet Count (CBC) 183  MCV  105  MCH  35.9  MCHC 34.2  RDW 12.8  Neutrophil % 61.3  Lymphocyte % 30.5  Monocyte % 7.4  Eosinophil % 0.6  Basophil % 0.2  Neutrophil # 3.0  Lymphocyte # 1.5  Monocyte # 0.4  Eosinophil # 0.0  Basophil # 0.0 (Result(s) reported on 10 Jun 2014 at 05:43AM.)   Assessment/Plan:  Assessment/Plan:  Assessment 1) Gib-likely upper with melena.  today hgb drifted further, now s/p 1 unit prbc, awaiting repeat hgb.  note bun improved.  no apparent repeat bleeding.   Plan 1) awaiting repeat hgb.  may need another unit prbc.  EGD for tomorrow unless further decline of hgb or evidence of recurrent bleeding.   discussed with Dr GMargaretmary Eddy   Electronic Signatures: SLoistine Simas(MD)  (Signed [phone])  Authored: Chief Complaint, VITAL SIGNS/ANCILLARY NOTES, Brief Assessment, Lab Results, Assessment/Plan   Last Updated: 22-Nov-15 12:31 by SLoistine Simas(MD)

## 2014-11-10 NOTE — Discharge Summary (Signed)
PATIENT NAME:  Ruben [name], Ruben [name] MR#:  [id number] DATE OF BIRTH:  Jan 31, 1946  DATE OF ADMISSION:  11/02/2013 DATE OF DISCHARGE:  11/03/2013  ADMITTING DIAGNOSIS: Transient ischemic attack.  DISCHARGE DIAGNOSES:  1. Transient ischemic attack with expressive aphasia, resolved. 2. Elevated blood pressure without a history of hypertension, resolved. Likely related to stress on admission. 3. Hyperlipidemia with LDL of 157 with triglycerides 250. 4. Elevated transaminases, resolved. 5. Hyperglycemia, resolved.  DISCHARGE CONDITION: Stable.  DISCHARGE MEDICATIONS: This patient is to start new medications which are Atorvastatin 10 mg daily and aspirin 81 mg p.o. daily. He is to continue Tylenol PM 500/25 two tablets at bedtime.  HOME OXYGEN: None.   DIET: Low salt, low-fat, low-cholesterol, regular consistency.   ACTIVITY LIMITATIONS: As tolerated.   FOLLOWUP APPOINTMENTS: With Dr. Clemmie Krill in 2 days after discharge.   CONSULTANTS: Care management, social work.   RADIOLOGIC STUDIES: CT scan of head without contrast 11/02/2013 revealed age-related volume loss with minimal small vessel disease. No intracranial mass, hemorrhage, or acute infarct. Opacification of a posterior left ethmoid air cell was noted. Carotid ultrasound 11/02/2013, revealed a small amount of plaque formation in both carotid bifurcations. Estimated bilateral ICA stenosis was less than 50%. Antegrade flow was noted in bilateral vertebral arteries. Echocardiogram, 11/01/2013, revealing left ventricular ejection fraction by visual estimation 50% to 55%, low normal global left ventricular systolic function.   HISTORY OF PRESENT ILLNESS: The patient is a 69 year old Caucasian male with no significant past medical history who presented to the hospital with difficulties with speech. He was having difficulty expressing himself as well as some stuttering. Please refer to Dr. Chana Bode Patel's admission on 11/02/2013.   PHYSICAL  EXAMINATION: VITALS SIGNS: On arrival to the hospital, temperature is 97.9, pulse was 82, respiratory rate was 20, blood pressure 168/88, saturation was 99% on room air.   Physical examination was unremarkable including neurology examination. Apparently the patient's symptoms lasted approximately 5 to 10 minutes.   LABORATORY DATA: The patient's lab data done on admission to the emergency room revealed mild elevation of BUN to 21 and a glucose to 117, otherwise BMP was unremarkable. The patient's liver enzymes were normal. TSH was normal at 2.52. Hemoglobin A1c was 5.3. The patient's CBC: White blood cell count was 4.5, hemoglobin was 14.6, platelet count 204 and absolute neutrophil count was normal to 2.4. Activated PTT was within normal limits. EKG showed normal sinus rhythm at 72 beats per minute, normal axis. No acute ST-T changes were noted.   The patient was admitted to the hospital for further evaluation with diagnosis of TIA. The patient had a carotid ultrasound done, which was unremarkable, as well as echocardiogram, which was also unremarkable. He had a lipid panel checked which revealed elevation of LDL to 157. Total cholesterol was 238, triglycerides were 250. HDL was 31. It was felt the patient would benefit from Lipitor or any other statin. Risks of statins were discussed with patient and he was amenable to initiate this medication. The patient was recommended to continue also aspirin for life. He is to follow up with Dr. Bernie Covey for further recommendations in regards to management of his hyperlipidemia as well as times by TIA. In regards to elevated transaminases, the patient's liver enzymes were rechecked on 09/05/2013 and they revealed normal AST at 29, elevation of AST, mild elevation of AST on admission was of unclear etiology but it resolved.   In regards to elevated blood pressure readings, the patient's blood pressure readings were high  whenever he came in to the emergency room  and it was felt that it was very likely related to stress of being in the emergency room. The patient's blood pressure normalized as time progressed.   On the day of discharge, the patient's vital signs: Temperature was 98.2, pulse was 83, respiratory rate was 19, blood pressure 116/68, saturation was 98% on room air at rest. It is recommended to follow the patient's blood pressure readings as outpatient and make decisions about initiation of blood pressure medications if needed. We did not feel; however, at this time that blood pressure medications should be initiated.   In regards to hyperglycemia as mentioned above, the patient's hemoglobin A1c was checked and was found to be only 5.3 signifying no diabetes mellitus. The patient is being discharged in stable condition with above-mentioned medications and followup.  TIME SPENT: 40 minutes on the patient.     ____________________________ Theodoro Grist, MD rv:lt D: 11/03/2013 19:34:19 ET T: 11/04/2013 02:44:44 ET JOB#: [id number]  cc: Theodoro Grist, MD, <Dictator> Valetta Close, MD [place] MD ELECTRONICALLY SIGNED 11/18/2013 18:24

## 2014-11-12 LAB — SURGICAL PATHOLOGY

## 2015-08-16 DIAGNOSIS — J069 Acute upper respiratory infection, unspecified: Secondary | ICD-10-CM | POA: Diagnosis not present

## 2015-09-19 DIAGNOSIS — R05 Cough: Secondary | ICD-10-CM | POA: Diagnosis not present

## 2015-12-27 DIAGNOSIS — Z125 Encounter for screening for malignant neoplasm of prostate: Secondary | ICD-10-CM | POA: Diagnosis not present

## 2015-12-27 DIAGNOSIS — Z79899 Other long term (current) drug therapy: Secondary | ICD-10-CM | POA: Diagnosis not present

## 2015-12-27 DIAGNOSIS — M720 Palmar fascial fibromatosis [Dupuytren]: Secondary | ICD-10-CM | POA: Diagnosis not present

## 2015-12-27 DIAGNOSIS — Z0000 Encounter for general adult medical examination without abnormal findings: Secondary | ICD-10-CM | POA: Diagnosis not present

## 2015-12-27 DIAGNOSIS — D500 Iron deficiency anemia secondary to blood loss (chronic): Secondary | ICD-10-CM | POA: Diagnosis not present

## 2015-12-27 DIAGNOSIS — E7800 Pure hypercholesterolemia, unspecified: Secondary | ICD-10-CM | POA: Diagnosis not present

## 2015-12-31 DIAGNOSIS — Z125 Encounter for screening for malignant neoplasm of prostate: Secondary | ICD-10-CM | POA: Diagnosis not present

## 2015-12-31 DIAGNOSIS — D500 Iron deficiency anemia secondary to blood loss (chronic): Secondary | ICD-10-CM | POA: Diagnosis not present

## 2015-12-31 DIAGNOSIS — E7800 Pure hypercholesterolemia, unspecified: Secondary | ICD-10-CM | POA: Diagnosis not present

## 2015-12-31 DIAGNOSIS — Z79899 Other long term (current) drug therapy: Secondary | ICD-10-CM | POA: Diagnosis not present

## 2016-01-15 DIAGNOSIS — M180 Bilateral primary osteoarthritis of first carpometacarpal joints: Secondary | ICD-10-CM | POA: Diagnosis not present

## 2016-01-15 DIAGNOSIS — M720 Palmar fascial fibromatosis [Dupuytren]: Secondary | ICD-10-CM | POA: Diagnosis not present

## 2016-01-15 DIAGNOSIS — M79641 Pain in right hand: Secondary | ICD-10-CM | POA: Diagnosis not present

## 2016-01-15 DIAGNOSIS — M79642 Pain in left hand: Secondary | ICD-10-CM | POA: Diagnosis not present

## 2016-05-22 DIAGNOSIS — L219 Seborrheic dermatitis, unspecified: Secondary | ICD-10-CM | POA: Diagnosis not present

## 2016-05-22 DIAGNOSIS — K210 Gastro-esophageal reflux disease with esophagitis: Secondary | ICD-10-CM | POA: Diagnosis not present

## 2016-05-22 DIAGNOSIS — J0100 Acute maxillary sinusitis, unspecified: Secondary | ICD-10-CM | POA: Diagnosis not present

## 2016-05-22 DIAGNOSIS — K30 Functional dyspepsia: Secondary | ICD-10-CM | POA: Diagnosis not present

## 2016-07-08 DIAGNOSIS — H16143 Punctate keratitis, bilateral: Secondary | ICD-10-CM | POA: Diagnosis not present

## 2016-07-08 DIAGNOSIS — H1820 Unspecified corneal edema: Secondary | ICD-10-CM | POA: Diagnosis not present

## 2016-07-15 DIAGNOSIS — H16143 Punctate keratitis, bilateral: Secondary | ICD-10-CM | POA: Diagnosis not present

## 2016-07-15 DIAGNOSIS — H1820 Unspecified corneal edema: Secondary | ICD-10-CM | POA: Diagnosis not present

## 2016-09-21 DIAGNOSIS — J0110 Acute frontal sinusitis, unspecified: Secondary | ICD-10-CM | POA: Diagnosis not present

## 2016-12-18 DIAGNOSIS — R0602 Shortness of breath: Secondary | ICD-10-CM | POA: Diagnosis not present

## 2016-12-18 DIAGNOSIS — R0982 Postnasal drip: Secondary | ICD-10-CM | POA: Diagnosis not present

## 2016-12-29 DIAGNOSIS — Z0000 Encounter for general adult medical examination without abnormal findings: Secondary | ICD-10-CM | POA: Diagnosis not present

## 2016-12-31 DIAGNOSIS — Z79899 Other long term (current) drug therapy: Secondary | ICD-10-CM | POA: Diagnosis not present

## 2016-12-31 DIAGNOSIS — Z125 Encounter for screening for malignant neoplasm of prostate: Secondary | ICD-10-CM | POA: Diagnosis not present

## 2016-12-31 DIAGNOSIS — E7800 Pure hypercholesterolemia, unspecified: Secondary | ICD-10-CM | POA: Diagnosis not present

## 2016-12-31 DIAGNOSIS — D500 Iron deficiency anemia secondary to blood loss (chronic): Secondary | ICD-10-CM | POA: Diagnosis not present

## 2017-02-03 DIAGNOSIS — H11129 Conjunctival concretions, unspecified eye: Secondary | ICD-10-CM | POA: Diagnosis not present

## 2017-02-03 DIAGNOSIS — H2513 Age-related nuclear cataract, bilateral: Secondary | ICD-10-CM | POA: Diagnosis not present

## 2017-04-05 DIAGNOSIS — B370 Candidal stomatitis: Secondary | ICD-10-CM | POA: Diagnosis not present

## 2017-04-05 DIAGNOSIS — K140 Glossitis: Secondary | ICD-10-CM | POA: Diagnosis not present

## 2017-04-05 DIAGNOSIS — D0007 Carcinoma in situ of tongue: Secondary | ICD-10-CM | POA: Diagnosis not present

## 2017-04-05 DIAGNOSIS — L578 Other skin changes due to chronic exposure to nonionizing radiation: Secondary | ICD-10-CM | POA: Diagnosis not present

## 2017-04-05 DIAGNOSIS — J302 Other seasonal allergic rhinitis: Secondary | ICD-10-CM | POA: Diagnosis not present

## 2017-04-05 DIAGNOSIS — K149 Disease of tongue, unspecified: Secondary | ICD-10-CM | POA: Diagnosis not present

## 2017-04-05 DIAGNOSIS — K148 Other diseases of tongue: Secondary | ICD-10-CM | POA: Diagnosis not present

## 2017-04-05 DIAGNOSIS — R490 Dysphonia: Secondary | ICD-10-CM | POA: Diagnosis not present

## 2017-04-15 DIAGNOSIS — Z79899 Other long term (current) drug therapy: Secondary | ICD-10-CM | POA: Diagnosis not present

## 2017-04-15 DIAGNOSIS — Z87891 Personal history of nicotine dependence: Secondary | ICD-10-CM | POA: Diagnosis not present

## 2017-04-15 DIAGNOSIS — K148 Other diseases of tongue: Secondary | ICD-10-CM | POA: Insufficient documentation

## 2017-04-15 DIAGNOSIS — C029 Malignant neoplasm of tongue, unspecified: Secondary | ICD-10-CM | POA: Diagnosis not present

## 2017-04-15 DIAGNOSIS — R220 Localized swelling, mass and lump, head: Secondary | ICD-10-CM | POA: Diagnosis not present

## 2017-04-15 DIAGNOSIS — Z01818 Encounter for other preprocedural examination: Secondary | ICD-10-CM | POA: Diagnosis not present

## 2017-04-15 DIAGNOSIS — D0007 Carcinoma in situ of tongue: Secondary | ICD-10-CM | POA: Diagnosis not present

## 2017-04-28 DIAGNOSIS — I700 Atherosclerosis of aorta: Secondary | ICD-10-CM | POA: Diagnosis not present

## 2017-04-28 DIAGNOSIS — R220 Localized swelling, mass and lump, head: Secondary | ICD-10-CM | POA: Diagnosis not present

## 2017-04-28 DIAGNOSIS — M47813 Spondylosis without myelopathy or radiculopathy, cervicothoracic region: Secondary | ICD-10-CM | POA: Diagnosis not present

## 2017-05-04 DIAGNOSIS — K259 Gastric ulcer, unspecified as acute or chronic, without hemorrhage or perforation: Secondary | ICD-10-CM | POA: Diagnosis not present

## 2017-05-04 DIAGNOSIS — J45909 Unspecified asthma, uncomplicated: Secondary | ICD-10-CM | POA: Diagnosis not present

## 2017-05-04 DIAGNOSIS — Z01818 Encounter for other preprocedural examination: Secondary | ICD-10-CM | POA: Diagnosis not present

## 2017-05-04 DIAGNOSIS — K219 Gastro-esophageal reflux disease without esophagitis: Secondary | ICD-10-CM | POA: Diagnosis not present

## 2017-05-04 DIAGNOSIS — Z87891 Personal history of nicotine dependence: Secondary | ICD-10-CM | POA: Diagnosis not present

## 2017-05-04 DIAGNOSIS — Z79899 Other long term (current) drug therapy: Secondary | ICD-10-CM | POA: Diagnosis not present

## 2017-05-04 DIAGNOSIS — D0007 Carcinoma in situ of tongue: Secondary | ICD-10-CM | POA: Diagnosis not present

## 2017-05-04 DIAGNOSIS — I21A1 Myocardial infarction type 2: Secondary | ICD-10-CM | POA: Diagnosis not present

## 2017-05-04 DIAGNOSIS — H9190 Unspecified hearing loss, unspecified ear: Secondary | ICD-10-CM | POA: Diagnosis not present

## 2017-05-04 DIAGNOSIS — R079 Chest pain, unspecified: Secondary | ICD-10-CM | POA: Diagnosis not present

## 2017-05-04 DIAGNOSIS — R220 Localized swelling, mass and lump, head: Secondary | ICD-10-CM | POA: Diagnosis not present

## 2017-05-04 DIAGNOSIS — Z7982 Long term (current) use of aspirin: Secondary | ICD-10-CM | POA: Diagnosis not present

## 2017-05-04 DIAGNOSIS — M26609 Unspecified temporomandibular joint disorder, unspecified side: Secondary | ICD-10-CM | POA: Diagnosis not present

## 2017-05-04 DIAGNOSIS — Z8673 Personal history of transient ischemic attack (TIA), and cerebral infarction without residual deficits: Secondary | ICD-10-CM | POA: Diagnosis not present

## 2017-05-04 DIAGNOSIS — Z7951 Long term (current) use of inhaled steroids: Secondary | ICD-10-CM | POA: Diagnosis not present

## 2017-05-13 ENCOUNTER — Other Ambulatory Visit: Payer: Self-pay

## 2017-05-13 NOTE — Patient Outreach (Signed)
[facility]) Care Management  05/13/2017  Ruben [name] 10-08-1945 [id number]   Transition of Care Referral  Referral Date: 05/13/17 Referral Source: [facility] Discharge Report Date of Admission: unknown Diagnosis: unknown Date of Discharge: 05/06/17 Facility: [place]:  HTA    Incoming call form patient returning RN CM call.   Social: Patient reports he resides in his home along with his spouse who assists with his care needs. He voices that his spouse works and does not get home until around 4pm. He reports that his sister in law who has down syndrome lives in the home as well. Patient voices that he as dtr that is an Therapist, sports who assists with his care needs as well. He is independent with ADLS and IADLS. He drives himself to medical appts. Patient denies any falls in the home. He voices that he does not use DME.   Conditions: Patient has history of TIA, GI Bleed, HLD,HTN. He voice that he some "tightness in chest" at times especially with exertion. He is using an inhaler to help manage symptoms. Patient voices that he was recently hospitalized for "something suspicious on tongue." He voices that he underwent oral surgery where they removed some of his tongue. He is still hoarse from procedure. He states that he had skin grafting done and taken from his left leg. Patient reports he is concerned about left leg getting infected and managing his wound care. He voices he was not given any discharge instructions on how to manage wound and area. He also voices that he has limited supplies in the home to perform wound care. He reports that he went for f/u appt earlier this week but no one looked at this leg -as they were more focused and concerned about the healing of his tongue.    Medications: Patient taking six meds. Denies any issues affording and/or managing meds.    Appointments: Patient voices that he is followed by PCP-Dr. Baldemar Lenis with [facility].    Consent: [facility] services reviewed and discussed. Patient gave verbal consent for services.   Plan: RN CM will notify [facility] administrative assistant of case status. RN CM will send referral to [facility] RN for further in home eval/assessment of care needs and management of chronic conditions.  Enzo Montgomery, RN,BSN,CCM [place] Management Telephonic Care Management Coordinator Direct Phone: [phone] Toll Free: [phone] Fax: [phone]

## 2017-05-13 NOTE — Patient Outreach (Signed)
[facility]) Care Management  05/13/2017  Ruben [name] 12-16-1945 [id number]      Transition of Care Referral  Referral Date: 05/13/17 Referral Source: [facility] Discharge Report Date of Admission: unknown Diagnosis: unknown Date of Discharge: 05/06/17 Facility: [place]:  HTA    Outreach attempt # 1 to patient.  No answer and unable to leave voicemail message.    Plan: RN CM will make outreach attempt to patient within one business day if no return call.    Ruben Montgomery, RN,BSN,CCM [place] Management Telephonic Care Management Coordinator Direct Phone: [phone] Toll Free: [phone] Fax: [phone]

## 2017-05-14 ENCOUNTER — Ambulatory Visit: Payer: Self-pay

## 2017-05-14 ENCOUNTER — Other Ambulatory Visit: Payer: Self-pay | Admitting: *Deleted

## 2017-05-14 ENCOUNTER — Encounter: Payer: Self-pay | Admitting: *Deleted

## 2017-05-14 NOTE — Patient Outreach (Addendum)
Successful telephone encounter to Ruben [name], 71 year old male - follow up on referral received 05/13/17 from [facility] telephonic RN CM for Community CM services/transition of care/recent hospitalization at [facility] for tongue surgery/left leg graft site, discharged October 18,2018.  Spoke with pt, HIPAA identifiers verified, discussed purpose of call- follow up on referral/recent hospitalization. RN CM discussed with pt [facility] services/transition of care program (follow for 31 days- weekly calls, a home visit)no cost to him.   Pt reports doing fine, main reason wanted a home visit from a nurse/know what to do about skin graft site left leg, concerned if okay to leave wound open to air.  Pt reports his daughter (an Therapist, sports) was with me when I received wound care instructions and reminded me about applying  antibiotic cream to wound/leave open to air.  Pt reports on recent tongue surgery, pathology report was suspicious for cancer, did not get final report yet.  Pt reports  Taking all medications as ordered, confirmed Dr. Baldemar Lenis as PCP to which RN CM  Inquired about follow up appointment.  Pt reports already saw one MD, to see surgeon next week, want to wait until after see surgeon before scheduling PCP visit.   Pt reports he is  Willing to have weekly phone calls from RN CM but does not see need for a home visit at  This time.      Plan:  As discussed with pt, plan to follow up again next week telephonically- part of  Ongoing transition of care.            Plan to inform Dr. Baldemar Lenis of [facility] involvement- send barrier letter.    Zara Chess.   [place] Management  [phone]  Addendum:  Provided pt with RN CM's name and direct number, [facility]'s main office number as well as 24 hour nurse advise line.

## 2017-05-21 ENCOUNTER — Other Ambulatory Visit: Payer: Self-pay | Admitting: *Deleted

## 2017-05-21 NOTE — Patient Outreach (Signed)
Successful telephone encounter to Ruben [name], 71 year old male for transition of care/ongoing follow up on recent hospitalization at [facility] October 16-18,2018  for tongue surgery (tongue mass)/left leg graft site.  Referral received from [facility] telephonic RN CM 05/13/17 for Community CM services/transition of care.  Spoke with pt, HIPAA identifiers verified.  Pt reports follow up with surgeon (Dr. Izora Ribas), told everything healing up (tongue, left leg), found no cancer.   Pt reports currently no pain, only have pain if talk too much- stitches in tongue.  Pt reports appetite is good, having protein milkshakes, wants to get back to having a variety of foods, chew.  Pt reports taking all medications as ordered.     Plan:  As discussed with pt, plan to follow up again next week telephonically (part of ongoing transition of care).   Ruben [name].   [place] Care Management  [phone]

## 2017-05-27 ENCOUNTER — Ambulatory Visit: Payer: Self-pay | Admitting: *Deleted

## 2017-05-27 ENCOUNTER — Other Ambulatory Visit: Payer: Self-pay | Admitting: *Deleted

## 2017-05-27 NOTE — Patient Outreach (Signed)
Unsuccessful telephone encounter to Ruben [name], 71 year old male for transition of care/ongoing follow up on recent hospitalization at [facility] October 16-18,2018 for tongue surgery (tongue mass),left leg graft site.  Unable to leave a voice message at this time as message on pt's phone- mail box full.     Plan:  RN CM to follow up again with pt tomorrow telephonically.     Zara Chess.   [place] Care Management  [phone]

## 2017-05-28 ENCOUNTER — Other Ambulatory Visit: Payer: Self-pay | Admitting: *Deleted

## 2017-05-28 NOTE — Patient Outreach (Signed)
Second unsuccessful telephone encounter to Schylar Wuebker, 71 year old male for transition of care/ongoing follow up on recent hospitalization at [facility] October 16-18,2018 for tongue surgery (tongue mass), left leg graft site.  Unable to leave a voice message due to pt's mail box full.    Plan:  RN CM to follow up again next week telephonically (part of ongoing transition of care).    Zara Chess.   [place] Care Management  [phone]

## 2017-06-01 ENCOUNTER — Encounter: Payer: Self-pay | Admitting: *Deleted

## 2017-06-01 ENCOUNTER — Other Ambulatory Visit: Payer: Self-pay | Admitting: *Deleted

## 2017-06-01 NOTE — Patient Outreach (Signed)
Third unsuccessful telephone encounter to Bentlie Catanzaro, 71 year old male for transition of car/ongoing follow up on recent hospitalization at [facility] October 16-18,2018 for tongue surgery (tongue mass), left leg graft site.  Unable to leave a voice message as pt's mail box full.   Plan:  With this being the third attempt, unable to contact letter to be sent to pt, if no response to letter in 10 business days to close case, notify MD.    Zara Chess.   [place] Care Management  [phone]

## 2017-06-03 ENCOUNTER — Ambulatory Visit: Payer: Self-pay | Admitting: *Deleted

## 2017-06-22 ENCOUNTER — Other Ambulatory Visit: Payer: Self-pay | Admitting: *Deleted

## 2017-06-22 ENCOUNTER — Encounter: Payer: Self-pay | Admitting: *Deleted

## 2017-06-22 NOTE — Patient Outreach (Signed)
This RN CM to discharge pt from [place] services  due to unable to contact- 3 unsuccessful phone calls/unable to contact letter sent- no responses.     Plan:  RN CM to close case.            Plan to inform Dr. Baldemar Lenis of case closure.             Plan to inform [facility] CMA to close case.    Zara Chess.   [place] Care Management  [phone]

## 2017-08-05 DIAGNOSIS — K148 Other diseases of tongue: Secondary | ICD-10-CM | POA: Diagnosis not present

## 2017-09-23 DIAGNOSIS — K148 Other diseases of tongue: Secondary | ICD-10-CM | POA: Diagnosis not present

## 2017-12-30 DIAGNOSIS — C069 Malignant neoplasm of mouth, unspecified: Secondary | ICD-10-CM | POA: Diagnosis not present

## 2018-02-15 DIAGNOSIS — K645 Perianal venous thrombosis: Secondary | ICD-10-CM | POA: Diagnosis not present

## 2018-02-23 DIAGNOSIS — K645 Perianal venous thrombosis: Secondary | ICD-10-CM | POA: Diagnosis not present

## 2018-04-07 DIAGNOSIS — M25512 Pain in left shoulder: Secondary | ICD-10-CM | POA: Diagnosis not present

## 2018-04-18 DIAGNOSIS — M6281 Muscle weakness (generalized): Secondary | ICD-10-CM | POA: Diagnosis not present

## 2018-04-18 DIAGNOSIS — M25612 Stiffness of left shoulder, not elsewhere classified: Secondary | ICD-10-CM | POA: Diagnosis not present

## 2018-04-18 DIAGNOSIS — M25512 Pain in left shoulder: Secondary | ICD-10-CM | POA: Diagnosis not present

## 2018-04-20 DIAGNOSIS — M7532 Calcific tendinitis of left shoulder: Secondary | ICD-10-CM | POA: Diagnosis not present

## 2018-04-20 DIAGNOSIS — M7582 Other shoulder lesions, left shoulder: Secondary | ICD-10-CM | POA: Diagnosis not present

## 2018-04-25 DIAGNOSIS — M6281 Muscle weakness (generalized): Secondary | ICD-10-CM | POA: Diagnosis not present

## 2018-05-02 DIAGNOSIS — M25612 Stiffness of left shoulder, not elsewhere classified: Secondary | ICD-10-CM | POA: Diagnosis not present

## 2018-05-02 DIAGNOSIS — M6281 Muscle weakness (generalized): Secondary | ICD-10-CM | POA: Diagnosis not present

## 2018-05-10 DIAGNOSIS — M25612 Stiffness of left shoulder, not elsewhere classified: Secondary | ICD-10-CM | POA: Diagnosis not present

## 2018-05-10 DIAGNOSIS — M25512 Pain in left shoulder: Secondary | ICD-10-CM | POA: Diagnosis not present

## 2018-05-10 DIAGNOSIS — M6281 Muscle weakness (generalized): Secondary | ICD-10-CM | POA: Diagnosis not present

## 2018-05-17 DIAGNOSIS — M25612 Stiffness of left shoulder, not elsewhere classified: Secondary | ICD-10-CM | POA: Diagnosis not present

## 2018-05-17 DIAGNOSIS — M25512 Pain in left shoulder: Secondary | ICD-10-CM | POA: Diagnosis not present

## 2018-05-17 DIAGNOSIS — M6281 Muscle weakness (generalized): Secondary | ICD-10-CM | POA: Diagnosis not present

## 2018-05-24 DIAGNOSIS — M7582 Other shoulder lesions, left shoulder: Secondary | ICD-10-CM | POA: Diagnosis not present

## 2018-05-24 DIAGNOSIS — M6281 Muscle weakness (generalized): Secondary | ICD-10-CM | POA: Diagnosis not present

## 2018-05-24 DIAGNOSIS — M25512 Pain in left shoulder: Secondary | ICD-10-CM | POA: Diagnosis not present

## 2018-05-24 DIAGNOSIS — M25612 Stiffness of left shoulder, not elsewhere classified: Secondary | ICD-10-CM | POA: Diagnosis not present

## 2018-06-13 DIAGNOSIS — M7582 Other shoulder lesions, left shoulder: Secondary | ICD-10-CM | POA: Diagnosis not present

## 2018-06-13 DIAGNOSIS — M7532 Calcific tendinitis of left shoulder: Secondary | ICD-10-CM | POA: Diagnosis not present

## 2018-07-06 DIAGNOSIS — H2513 Age-related nuclear cataract, bilateral: Secondary | ICD-10-CM | POA: Diagnosis not present

## 2018-07-06 DIAGNOSIS — H04123 Dry eye syndrome of bilateral lacrimal glands: Secondary | ICD-10-CM | POA: Diagnosis not present

## 2018-08-08 ENCOUNTER — Other Ambulatory Visit: Payer: Self-pay | Admitting: Surgery

## 2018-08-08 DIAGNOSIS — M7532 Calcific tendinitis of left shoulder: Secondary | ICD-10-CM

## 2018-08-08 DIAGNOSIS — M7582 Other shoulder lesions, left shoulder: Secondary | ICD-10-CM | POA: Diagnosis not present

## 2018-08-15 ENCOUNTER — Ambulatory Visit
Admission: RE | Admit: 2018-08-15 | Discharge: 2018-08-15 | Disposition: A | Discharge status: Home / Self Care | Payer: PPO | Source: Ambulatory Visit | Attending: Surgery | Admitting: Surgery

## 2018-08-15 DIAGNOSIS — M7532 Calcific tendinitis of left shoulder: Secondary | ICD-10-CM

## 2018-08-15 DIAGNOSIS — S43432A Superior glenoid labrum lesion of left shoulder, initial encounter: Secondary | ICD-10-CM | POA: Diagnosis not present

## 2018-08-22 DIAGNOSIS — M75122 Complete rotator cuff tear or rupture of left shoulder, not specified as traumatic: Secondary | ICD-10-CM | POA: Diagnosis not present

## 2018-08-22 DIAGNOSIS — M7532 Calcific tendinitis of left shoulder: Secondary | ICD-10-CM | POA: Diagnosis not present

## 2018-08-22 DIAGNOSIS — M7582 Other shoulder lesions, left shoulder: Secondary | ICD-10-CM | POA: Diagnosis not present

## 2018-09-08 DIAGNOSIS — K148 Other diseases of tongue: Secondary | ICD-10-CM | POA: Diagnosis not present

## 2019-11-10 DIAGNOSIS — B370 Candidal stomatitis: Secondary | ICD-10-CM | POA: Diagnosis not present

## 2020-01-12 DIAGNOSIS — Z0000 Encounter for general adult medical examination without abnormal findings: Secondary | ICD-10-CM | POA: Diagnosis not present

## 2020-01-12 DIAGNOSIS — Z1331 Encounter for screening for depression: Secondary | ICD-10-CM | POA: Diagnosis not present

## 2020-01-12 DIAGNOSIS — Z125 Encounter for screening for malignant neoplasm of prostate: Secondary | ICD-10-CM | POA: Diagnosis not present

## 2020-01-12 DIAGNOSIS — N182 Chronic kidney disease, stage 2 (mild): Secondary | ICD-10-CM | POA: Diagnosis not present

## 2020-06-24 DIAGNOSIS — R1013 Epigastric pain: Secondary | ICD-10-CM | POA: Diagnosis not present

## 2020-06-24 DIAGNOSIS — R140 Abdominal distension (gaseous): Secondary | ICD-10-CM | POA: Diagnosis not present

## 2020-06-24 DIAGNOSIS — K8020 Calculus of gallbladder without cholecystitis without obstruction: Secondary | ICD-10-CM | POA: Diagnosis not present

## 2020-06-24 DIAGNOSIS — Z8719 Personal history of other diseases of the digestive system: Secondary | ICD-10-CM | POA: Diagnosis not present

## 2020-06-24 DIAGNOSIS — N200 Calculus of kidney: Secondary | ICD-10-CM | POA: Diagnosis not present

## 2020-06-24 DIAGNOSIS — R1011 Right upper quadrant pain: Secondary | ICD-10-CM | POA: Diagnosis not present

## 2020-10-28 ENCOUNTER — Emergency Department: Payer: PPO

## 2020-10-28 ENCOUNTER — Observation Stay
Admission: EM | Admit: 2020-10-28 | Discharge: 2020-10-29 | Disposition: A | Discharge status: Home / Self Care | Payer: PPO | Attending: Internal Medicine | Admitting: Internal Medicine

## 2020-10-28 ENCOUNTER — Observation Stay: Payer: PPO

## 2020-10-28 ENCOUNTER — Other Ambulatory Visit: Payer: Self-pay

## 2020-10-28 ENCOUNTER — Encounter: Payer: Self-pay | Admitting: Emergency Medicine

## 2020-10-28 DIAGNOSIS — I6523 Occlusion and stenosis of bilateral carotid arteries: Secondary | ICD-10-CM | POA: Diagnosis not present

## 2020-10-28 DIAGNOSIS — R410 Disorientation, unspecified: Secondary | ICD-10-CM | POA: Diagnosis not present

## 2020-10-28 DIAGNOSIS — Z85810 Personal history of malignant neoplasm of tongue: Secondary | ICD-10-CM | POA: Insufficient documentation

## 2020-10-28 DIAGNOSIS — G459 Transient cerebral ischemic attack, unspecified: Secondary | ICD-10-CM | POA: Diagnosis not present

## 2020-10-28 DIAGNOSIS — K279 Peptic ulcer, site unspecified, unspecified as acute or chronic, without hemorrhage or perforation: Secondary | ICD-10-CM

## 2020-10-28 DIAGNOSIS — Z20822 Contact with and (suspected) exposure to covid-19: Secondary | ICD-10-CM | POA: Diagnosis not present

## 2020-10-28 DIAGNOSIS — K2950 Unspecified chronic gastritis without bleeding: Secondary | ICD-10-CM

## 2020-10-28 DIAGNOSIS — R4701 Aphasia: Secondary | ICD-10-CM

## 2020-10-28 DIAGNOSIS — R9431 Abnormal electrocardiogram [ECG] [EKG]: Secondary | ICD-10-CM | POA: Diagnosis not present

## 2020-10-28 DIAGNOSIS — K2970 Gastritis, unspecified, without bleeding: Secondary | ICD-10-CM

## 2020-10-28 DIAGNOSIS — G319 Degenerative disease of nervous system, unspecified: Secondary | ICD-10-CM | POA: Diagnosis not present

## 2020-10-28 HISTORY — DX: Transient cerebral ischemic attack, unspecified: G45.9

## 2020-10-28 LAB — CBC
HCT: 42.5 % (ref 39.0–52.0)
Hemoglobin: 15 g/dL (ref 13.0–17.0)
MCH: 35.1 pg — ABNORMAL HIGH (ref 26.0–34.0)
MCHC: 35.3 g/dL (ref 30.0–36.0)
MCV: 99.5 fL (ref 80.0–100.0)
Platelets: 200 10*3/uL (ref 150–400)
RBC: 4.27 MIL/uL (ref 4.22–5.81)
RDW: 11.8 % (ref 11.5–15.5)
WBC: 7 10*3/uL (ref 4.0–10.5)
nRBC: 0 % (ref 0.0–0.2)

## 2020-10-28 LAB — COMPREHENSIVE METABOLIC PANEL
ALT: 27 U/L (ref 0–44)
AST: 30 U/L (ref 15–41)
Albumin: 4 g/dL (ref 3.5–5.0)
Alkaline Phosphatase: 23 U/L — ABNORMAL LOW (ref 38–126)
Anion gap: 9 (ref 5–15)
BUN: 20 mg/dL (ref 8–23)
CO2: 26 mmol/L (ref 22–32)
Calcium: 9 mg/dL (ref 8.9–10.3)
Chloride: 103 mmol/L (ref 98–111)
Creatinine, Ser: 1.1 mg/dL (ref 0.61–1.24)
GFR, Estimated: >60 mL/min (ref >60)
Glucose, Bld: 157 mg/dL — ABNORMAL HIGH (ref 70–99)
Potassium: 4.1 mmol/L (ref 3.5–5.1)
Sodium: 138 mmol/L (ref 135–145)
Total Bilirubin: 1.2 mg/dL (ref 0.3–1.2)
Total Protein: 6.3 g/dL — ABNORMAL LOW (ref 6.5–8.1)

## 2020-10-28 LAB — RESP PANEL BY RT-PCR (FLU A&B, COVID) ARPGX2
Influenza A by PCR: NEGATIVE
Influenza B by PCR: NEGATIVE
SARS Coronavirus 2 by RT PCR: NEGATIVE

## 2020-10-28 MED ORDER — ENOXAPARIN SODIUM 40 MG/0.4ML ~~LOC~~ SOLN
40.0000 mg | SUBCUTANEOUS | Status: DC
  Filled 2020-10-28: qty 0.4

## 2020-10-28 MED ORDER — ACETAMINOPHEN 650 MG RE SUPP: 650.0000 mg | RECTAL | Status: DC | PRN

## 2020-10-28 MED ORDER — ASPIRIN 325 MG PO TABS
325.0000 mg | ORAL_TABLET | Freq: Every day | ORAL | Status: DC
  Administered 2020-10-29: 09:00:00 325 mg via ORAL
  Filled 2020-10-28 (×3): qty 1

## 2020-10-28 MED ORDER — ACETAMINOPHEN 325 MG PO TABS: 650.0000 mg | ORAL_TABLET | ORAL | Status: DC | PRN

## 2020-10-28 MED ORDER — DIPHENHYDRAMINE HCL 25 MG PO CAPS: 25.0000 mg | ORAL_CAPSULE | Freq: Every evening | ORAL | Status: DC | PRN

## 2020-10-28 MED ORDER — SENNOSIDES-DOCUSATE SODIUM 8.6-50 MG PO TABS: 1.0000 | ORAL_TABLET | Freq: Every evening | ORAL | Status: DC | PRN

## 2020-10-28 MED ORDER — STROKE: EARLY STAGES OF RECOVERY BOOK: Freq: Once | Status: AC

## 2020-10-28 MED ORDER — ACETAMINOPHEN 160 MG/5ML PO SOLN
650.0000 mg | ORAL | Status: DC | PRN
  Filled 2020-10-28: qty 20.3

## 2020-10-28 MED ORDER — ASPIRIN 300 MG RE SUPP
300.0000 mg | Freq: Every day | RECTAL | Status: DC
  Filled 2020-10-28: qty 1

## 2020-10-28 NOTE — ED Notes (Addendum)
Pt refused covid swab.  States he is having no symptoms, has not been around anyone who has had covid.  Pt is very pleasant and not trying to be difficult, but refuses swab.   Will notify provider of decision.

## 2020-10-28 NOTE — H&P (Addendum)
History and Physical   CAM DAUPHIN MWU:[id number] DOB: Jun 28, 1946 DOA: 10/28/2020  PCP: [name], MD   Patient coming from: Home  Chief Complaint: Aphasia and difficulty with reading comprehension  HPI: Ruben [name] is a 75 y.o. male with medical history significant of colon polyp, diverticulosis, peptic ulcer disease, gastritis, hyperlipidemia, SBO, CVA/TIA, glossal cancer who presents after episodes of difficulty with reading comprehension and aphasia.  Patient states that around 2:30 PM he began experience systems of difficulty understanding what he was reading.  States he was reading a magazine and then was unable to understand what he was reading and this went on for several minutes.  He then got in his vehicle to go to the store.  His wife came out to talk to him and he was unable to tell her what he was doing as he could not form the correct words and it came out as "gibberish ".  He and his wife decided that he should come to the hospital and she gave him aspirin before the left.  He states that he had similar symptoms 6 years ago and was diagnosed with a TIA at that time.  He denies any other deficits in sensation or strength.  He denies fevers, chills, chest pain, shortness breath, abdominal pain, constipation, diarrhea, nausea, vomiting.  Of note, patient initially hesitant to stay for admission on my evaluation.  His wife and children are speaking with him about that encouraged him to stay.  He is also resistant to Covid screening swab due to concerns that the testing is fallen and that he would be very upset if he had a false positive.  He appears to be coming around on getting Covid swab and staying however it is not clear if he will attempt to leave AMA.  Will speak with patient again later if needed.  ED Course: Vital signs in the ED stable.  Lab work-up showed CMP with glucose 157, protein 6.3.  CBC within the limits.  Urinalysis and UDS pending.  Respiratory panel  flu COVID pending.  CT head without acute abnormality.  MR brain and MRA brain ordered.  Review of Systems: As per HPI otherwise all other systems reviewed and are negative.  Past Medical History:  Diagnosis Date  . TIA (transient ischemic attack)     Past Surgical History:  Procedure Laterality Date  . SHOULDER ARTHROSCOPY      Social History  reports that he has never smoked. He has never used smokeless tobacco. He reports previous alcohol use. He reports that he does not use drugs.  No Known Allergies  Family History  Problem Relation Age of Onset  . Colon cancer Mother   . Heart disease Father   Reviewed on admission  Prior to Admission medications   Not on File  Omeprazole 20 mg daily  Physical Exam: Vitals:   10/28/20 1533 10/28/20 1537 10/28/20 1600 10/28/20 1734  BP:  (!) 154/84 140/70 124/71  Pulse:  84 73 67  Resp:  18 14 16   Temp:  97.9 F (36.6 C)    TempSrc:  Oral    SpO2:  100% 100% 100%  Weight: 63.5 kg     Height: 5\' 7"  (1.702 m)      Physical Exam Constitutional:      General: He is not in acute distress.    Appearance: Normal appearance.  HENT:     Head: Normocephalic and atraumatic.     Mouth/Throat:     Mouth:  Mucous membranes are moist.     Pharynx: Oropharynx is clear.  Eyes:     Extraocular Movements: Extraocular movements intact.     Pupils: Pupils are equal, round, and reactive to light.  Cardiovascular:     Rate and Rhythm: Normal rate and regular rhythm.     Pulses: Normal pulses.     Heart sounds: Normal heart sounds.  Pulmonary:     Effort: Pulmonary effort is normal. No respiratory distress.     Breath sounds: Normal breath sounds.  Abdominal:     General: Bowel sounds are normal. There is no distension.     Palpations: Abdomen is soft.     Tenderness: There is no abdominal tenderness.  Musculoskeletal:        General: No swelling or deformity.  Skin:    General: Skin is warm and dry.  Neurological:     General: No  focal deficit present.     Mental Status: Mental status is at baseline.     Comments: Mental Status: Patient is awake, alert, oriented x3 No signs of aphasia or neglect Cranial Nerves: II: Pupils equal, round, and reactive to light.  III,IV, VI: EOMI without ptosis or diploplia.  V: Facial sensation is symmetric tolight touch andTemperature. VII: Facial movement is symmetric.  VIII: hearing is intact to voice X: Uvula elevates symmetrically XI: Shoulder shrug is symmetric. XII: tongue is midline without atrophy or fasciculations.  Motor: Good effort thorughout, at Least 5/5 bilateral UE, 5/5 bilateral lower extremitiy Sensory: Sensation is grossly intact bilateral UEs & LEs Cerebellar: Finger-Nose intact bilalat    Labs on Admission: I have personally reviewed following labs and imaging studies  CBC: Recent Labs  Lab 10/28/20 1551  WBC 7.0  HGB 15.0  HCT 42.5  MCV 99.5  PLT 790    Basic Metabolic Panel: Recent Labs  Lab 10/28/20 1551  NA 138  K 4.1  CL 103  CO2 26  GLUCOSE 157*  BUN 20  CREATININE 1.10  CALCIUM 9.0    GFR: Estimated Creatinine Clearance: 53.7 mL/min (by C-G formula based on SCr of 1.1 mg/dL).  Liver Function Tests: Recent Labs  Lab 10/28/20 1551  AST 30  ALT 27  ALKPHOS 23*  BILITOT 1.2  PROT 6.3*  ALBUMIN 4.0    Urine analysis: No results found for: COLORURINE, APPEARANCEUR, LABSPEC, PHURINE, GLUCOSEU, HGBUR, BILIRUBINUR, KETONESUR, PROTEINUR, UROBILINOGEN, NITRITE, LEUKOCYTESUR  Radiological Exams on Admission: CT HEAD WO CONTRAST  Result Date: 10/28/2020 CLINICAL DATA:  Aphasia and confusion, symptoms since 14 30 which have resolved EXAM: CT HEAD WITHOUT CONTRAST TECHNIQUE: Contiguous axial images were obtained from the base of the skull through the vertex without intravenous contrast. COMPARISON:  11/02/2013 FINDINGS: Brain: No acute infarct or hemorrhage. Lateral ventricles and midline structures are unremarkable. No acute  extra-axial fluid collections. No mass effect. Vascular: No hyperdense vessel or unexpected calcification. Skull: Normal. Negative for fracture or focal lesion. Sinuses/Orbits: Chronic opacification posterior left ethmoid air cell. Otherwise the paranasal sinuses are clear. Other: None. IMPRESSION: 1. No acute intracranial process. Electronically Signed   By: Randa Ngo M.D.   On: 10/28/2020 16:40   EKG: Independently reviewed.  Sinus rhythm at 88 bpm.  Some baseline wander.  Assessment/Plan Principal Problem:   TIA (transient ischemic attack) Active Problems:   Gastritis   PUD (peptic ulcer disease)  TIA > Patient with difficulty with reading comprehension for about an hour and then aphasia with words coming out as gibberish. > Lasted for  minutes to hours, reportedly was resolving when he got to the ED and was completely resolved on my exam. > Prior history of TIA about 6 years ago similar symptoms. > Initial work-up in ED with negative CT head. - Monitor on telemetry - Neurology will need to be consulted in the morning. - Allow for permissive HTN for now, though BP is normal in ED. - ASA 325mg  - Consider Statin - Echocardiogram  - Carotid doppler  - Follow-up MRI and MRA - A1C  - Lipid panel  - Tele monitoring  - SLP eval - PT/OT  Gastritis PUD - Continue home PPI  DVT prophylaxis: Lovenox  Code Status:   Full  Family Communication:  Wife updated at bedside Disposition Plan:   Patient is from:  Home  Anticipated DC to:  Home  Anticipated DC date:  1 to 2 days  Anticipated DC barriers: None  Consults called:  Neurology will need to be consulted in the morning. Admission status:  Observation, telemetry   Severity of Illness: The appropriate patient status for this patient is OBSERVATION. Observation status is judged to be reasonable and necessary in order to provide the required intensity of service to ensure the patient's safety. The patient's presenting symptoms,  physical exam findings, and initial radiographic and laboratory data in the context of their medical condition is felt to place them at decreased risk for further clinical deterioration. Furthermore, it is anticipated that the patient will be medically stable for discharge from the hospital within 2 midnights of admission. The following factors support the patient status of observation.   " The patient's presenting symptoms include difficulty with reading comprehension of and aphasia. " The physical exam findings include stable physical exam. " The initial radiographic and laboratory data are significant for glucose 157, protein 6.3.  Normal CTA neck.  MR brain and MRA brain pending.   Marcelyn Bruins MD [facility]  How to contact the [facility] Attending or Consulting provider [place] or covering provider during after hours [place], for this patient?   1. Check the care team in [facility] and look for a) attending/consulting [facility] provider listed and b) the [facility] team listed 2. Log into [email or web address] and use [place]'s universal password to access. If you do not have the password, please contact the hospital operator. 3. Locate the [facility] provider you are looking for under [facility] and page to a number that you can be directly reached. 4. If you still have difficulty reaching the provider, please page the [facility] (Director on Call) for the Hospitalists listed on amion for assistance.  10/28/2020, 7:31 PM

## 2020-10-28 NOTE — ED Provider Notes (Addendum)
[facility] Emergency Department Provider Note   ____________________________________________   Event Date/Time   First MD Initiated Contact with Patient 10/28/20 1529     (approximate)  I have reviewed the triage vital signs and the nursing notes.   HISTORY  Chief Complaint Aphasia    HPI Ruben [name] is a 75 y.o. male with stated past medical history of glossal cancer and TIA approximately 6 years prior to arrival who presents for aphasia and difficulty understanding read words that began at approximately 1430 today and has resolved prior to presentation.  Patient states that he had symptoms similar to these when he had a TIA 6 years ago and is concerned that he may be having a TIA at this time.  Patient states that he was reading a magazine and found himself unable to comprehend what he was reading.  Soon after, he attempted to tell his wife that he needed to go to the store but could not formulate the correct words and came out speaking "gibberish".  Denies any additional symptoms at the time including no facial droop, weakness/numbness/paresthesias in any extremity, difficulty walking, or vision changes.  Patient currently denies any vision changes, tinnitus, difficulty speaking, facial droop, sore throat, chest pain, shortness of breath, abdominal pain, nausea/vomiting/diarrhea, dysuria, or weakness/numbness/paresthesias in any extremity         Past Medical History:  Diagnosis Date  . TIA (transient ischemic attack)     Patient Active Problem List   Diagnosis Date Noted  . Tongue mass 04/15/2017    Past Surgical History:  Procedure Laterality Date  . SHOULDER ARTHROSCOPY      Prior to Admission medications   Not on File    Allergies Patient has no known allergies.  No family history on file.  Social History Social History   Tobacco Use  . Smoking status: Never Smoker  . Smokeless tobacco: Never Used  Substance Use Topics  .  Alcohol use: Not Currently  . Drug use: Never    Review of Systems Constitutional: No fever/chills Eyes: No visual changes. ENT: No sore throat. Cardiovascular: Denies chest pain. Respiratory: Denies shortness of breath. Gastrointestinal: No abdominal pain.  No nausea, no vomiting.  No diarrhea. Genitourinary: Negative for dysuria. Musculoskeletal: Negative for acute arthralgias Skin: Negative for rash. Neurological: Endorses aphasia and difficult reading comprehension.  Negative for headaches, weakness/numbness/paresthesias in any extremity Psychiatric: Negative for suicidal ideation/homicidal ideation   ____________________________________________   PHYSICAL EXAM:  VITAL SIGNS: ED Triage Vitals  Enc Vitals Group     BP 10/28/20 1537 (!) 154/84     Pulse Rate 10/28/20 1537 84     Resp 10/28/20 1537 18     Temp 10/28/20 1537 97.9 F (36.6 C)     Temp Source 10/28/20 1537 Oral     SpO2 10/28/20 1537 100 %     Weight 10/28/20 1533 140 lb (63.5 kg)     Height 10/28/20 1533 5\' 7"  (1.702 m)     Head Circumference --      Peak Flow --      Pain Score 10/28/20 1533 0     Pain Loc --      Pain Edu? --      Excl. in [place]? --    Constitutional: Alert and oriented. Well appearing and in no acute distress. Eyes: Conjunctivae are normal. PERRL. Head: Atraumatic. Nose: No congestion/rhinnorhea. Mouth/Throat: Mucous membranes are moist. Neck: No stridor Cardiovascular: Grossly normal heart sounds.  Good peripheral  circulation. Respiratory: Normal respiratory effort.  No retractions. Gastrointestinal: Soft and nontender. No distention. Musculoskeletal: No obvious deformities Neurologic:  Normal speech and language. No gross focal neurologic deficits are appreciated. NIHSS 0 Skin:  Skin is warm and dry. No rash noted. Psychiatric: Mood and affect are normal. Speech and behavior are normal.  ____________________________________________   LABS (all labs ordered are listed, but  only abnormal results are displayed)  Labs Reviewed  COMPREHENSIVE METABOLIC PANEL - Abnormal; Notable for the following components:      Result Value   Glucose, Bld 157 (*)    Total Protein 6.3 (*)    Alkaline Phosphatase 23 (*)    All other components within normal limits  CBC - Abnormal; Notable for the following components:   MCH 35.1 (*)    All other components within normal limits  RESP PANEL BY RT-PCR (FLU A&B, COVID) ARPGX2  RAPID URINE DRUG SCREEN, HOSP PERFORMED  URINALYSIS, ROUTINE W REFLEX MICROSCOPIC   ____________________________________________  EKG  ED ECG REPORT I, Naaman Plummer, the attending physician, personally viewed and interpreted this ECG.  Date: 10/28/2020 EKG Time: 1534 Rate: 88 Rhythm: normal sinus rhythm QRS Axis: normal Intervals: normal ST/T Wave abnormalities: normal Narrative Interpretation: no evidence of acute ischemia  ____________________________________________  RADIOLOGY  ED MD interpretation: CT without contrast of the head does not show any evidence of acute intracranial abnormalities  Official radiology report(s): CT HEAD WO CONTRAST  Result Date: 10/28/2020 CLINICAL DATA:  Aphasia and confusion, symptoms since 14 30 which have resolved EXAM: CT HEAD WITHOUT CONTRAST TECHNIQUE: Contiguous axial images were obtained from the base of the skull through the vertex without intravenous contrast. COMPARISON:  11/02/2013 FINDINGS: Brain: No acute infarct or hemorrhage. Lateral ventricles and midline structures are unremarkable. No acute extra-axial fluid collections. No mass effect. Vascular: No hyperdense vessel or unexpected calcification. Skull: Normal. Negative for fracture or focal lesion. Sinuses/Orbits: Chronic opacification posterior left ethmoid air cell. Otherwise the paranasal sinuses are clear. Other: None. IMPRESSION: 1. No acute intracranial process. Electronically Signed   By: Randa Ngo M.D.   On: 10/28/2020 16:40     ____________________________________________   PROCEDURES  Procedure(s) performed (including Critical Care):  .1-3 Lead EKG Interpretation Performed by: Naaman Plummer, MD Authorized by: Naaman Plummer, MD     Interpretation: normal     ECG rate:  66   ECG rate assessment: normal     Rhythm: sinus rhythm     Ectopy: none     Conduction: normal       ____________________________________________   INITIAL IMPRESSION / ASSESSMENT AND PLAN / ED COURSE  As part of my medical decision making, I reviewed the following data within the [place] notes reviewed and incorporated, Labs reviewed, EKG interpreted, Old chart reviewed, Radiograph reviewed and Notes from prior ED visits reviewed and incorporated        Patient is a 75 year old male who presents with symptoms concerning for TIA PMH risk factors: Previous TIA Neurologic Deficits: Difficulty reading and aphasia Last known Well Time: 1430 NIH Stroke Score: 0 Given History and Exam I have lower suspicion for infectious etiology, neurologic changes secondary to toxicologic ingestion, seizure, complex migraine. Presentation concerning for possible stroke requiring workup.  Workup: Labs: POC glucose, CBC, BMP, LFTs, Troponin, PT/INR, PTT, Type and Screen Other Diagnostics: ECG, CXR, non-contrast head CT followed by CTA brain and neck (to r/o large vessel occlusion amenable to thrombectomy) Interventions: Patient's not eligible for TPA  due to resolution of symptoms prior to evaluation  Consult: hospitalist Disposition: Admit      ____________________________________________   FINAL CLINICAL IMPRESSION(S) / ED DIAGNOSES  Final diagnoses:  Aphasia  TIA (transient ischemic attack)     ED Discharge Orders    None       Note:  This document was prepared using Dragon voice recognition software and may include unintentional dictation errors.   Naaman Plummer, MD 10/28/20  1739    Naaman Plummer, MD 10/28/20 1740

## 2020-10-28 NOTE — ED Triage Notes (Signed)
Patient comes in c/o aphasia and confusion while reading-unable to understand what he was reading. Patient states that his symptoms started around 1430. Patient states that symptoms have now resolved, has hx of TIA several years ago.

## 2020-10-28 NOTE — ED Notes (Signed)
Pt to go to MRI in 10-15 minutes per MRI tech

## 2020-10-29 ENCOUNTER — Observation Stay (HOSPITAL_BASED_OUTPATIENT_CLINIC_OR_DEPARTMENT_OTHER)
Admit: 2020-10-29 | Discharge: 2020-10-29 | Disposition: A | Discharge status: Home / Self Care | Payer: PPO | Attending: Internal Medicine | Admitting: Internal Medicine

## 2020-10-29 DIAGNOSIS — G459 Transient cerebral ischemic attack, unspecified: Secondary | ICD-10-CM

## 2020-10-29 DIAGNOSIS — K2950 Unspecified chronic gastritis without bleeding: Secondary | ICD-10-CM | POA: Diagnosis not present

## 2020-10-29 LAB — LIPID PANEL
Cholesterol: 245 mg/dL — ABNORMAL HIGH (ref 0–200)
HDL: 34 mg/dL — ABNORMAL LOW (ref >40)
LDL Cholesterol: 144 mg/dL — ABNORMAL HIGH (ref 0–99)
Total CHOL/HDL Ratio: 7.2 RATIO
Triglycerides: 335 mg/dL — ABNORMAL HIGH (ref <150)
VLDL: 67 mg/dL — ABNORMAL HIGH (ref 0–40)

## 2020-10-29 LAB — ECHOCARDIOGRAM COMPLETE
AR max vel: 2.87 cm2
AV Area VTI: 2.34 cm2
AV Area mean vel: 2.76 cm2
AV Mean grad: 4 mmHg
AV Peak grad: 6.1 mmHg
Ao pk vel: 1.23 m/s
Area-P 1/2: 3.87 cm2
Calc EF: 63.9 %
Height: 68 in
MV VTI: 2.58 cm2
Single Plane A2C EF: 68.4 %
Single Plane A4C EF: 58.5 %
Weight: 2271.62 oz

## 2020-10-29 LAB — HEMOGLOBIN A1C
Hgb A1c MFr Bld: 5.1 % (ref 4.8–5.6)
Mean Plasma Glucose: 99.67 mg/dL

## 2020-10-29 MED ORDER — ATORVASTATIN CALCIUM 20 MG PO TABS
80.0000 mg | ORAL_TABLET | Freq: Every day | ORAL | Status: DC
  Filled 2020-10-29: qty 4

## 2020-10-29 MED ORDER — VITAMIN B-12 1000 MCG PO TABS
1000.0000 ug | ORAL_TABLET | Freq: Every day | ORAL | Status: DC
  Filled 2020-10-29: qty 1

## 2020-10-29 MED ORDER — ASPIRIN EC 81 MG PO TBEC: 81.0000 mg | DELAYED_RELEASE_TABLET | Freq: Every day | ORAL | Status: DC

## 2020-10-29 MED ORDER — ASPIRIN 81 MG PO TBEC: 81.0000 mg | DELAYED_RELEASE_TABLET | Freq: Every day | ORAL | 0 refills | Status: DC

## 2020-10-29 MED ORDER — ASCORBIC ACID 500 MG PO TABS
250.0000 mg | ORAL_TABLET | Freq: Every day | ORAL | Status: DC
  Filled 2020-10-29: qty 1

## 2020-10-29 MED ORDER — VITAMIN D 25 MCG (1000 UNIT) PO TABS
1000.0000 [IU] | ORAL_TABLET | Freq: Every day | ORAL | Status: DC
  Filled 2020-10-29: qty 1

## 2020-10-29 MED ORDER — ATORVASTATIN CALCIUM 20 MG PO TABS: 80.0000 mg | ORAL_TABLET | Freq: Every day | ORAL | 0 refills | Status: DC

## 2020-10-29 NOTE — Progress Notes (Signed)
SLP Cancellation Note  Patient Details Name: Ruben [name] MRN: [id number] DOB: 09/20/1945   Cancelled treatment:       Reason Eval/Treat Not Completed: SLP screened, no needs identified, will sign off (chart reviewed; consulted NSG then met w/ pt and Wife in room). Pt denied any difficulty swallowing and is currently on a regular diet. Unsure if on any medications as pt does not take any at home he stated. Pt conversed at conversational level w/out deficits noted; pt and Wife denied any speech-language deficits. He was able to describe the events of yesterday including the speech and reading issues in order and w/ detail. He stated resolution of the issues; Wife agreed. Pt was eager to see the MD re: tests and discharge hopefully. No further skilled ST services indicated as pt appears at his baseline. Pt agreed. NSG to reconsult if any change in status.     Katherine Watson, MS, CCC-SLP Speech Language Pathologist [facility] [phone] Watson,Katherine 10/29/2020, 8:46 AM   

## 2020-10-29 NOTE — Progress Notes (Signed)
*  PRELIMINARY RESULTS* Echocardiogram 2D Echocardiogram has been performed.  Ruben [name] 10/29/2020, 2:18 PM

## 2020-10-29 NOTE — Consult Note (Signed)
NEUROLOGY CONSULTATION NOTE   Date of service: October 29, 2020 Patient Name: Ruben [name] MRN:  [id number] DOB:  Jan 28, 1946 Reason for consult: "TIA" _ _ _   _ __   _ __ _ _  __ __   _ __   __ _  History of Present Illness  Ruben [name] is a 75 y.o. male with PMH significant for TIA, SBO, HLD, gastritis, PUD, diverticulosis who presents with an episode concerning for aphasia that self resolved.  Symptoms first noted at 1430 on 10/28/20. Was reading a magazine, when he could not comprehend what he was reading. Went to his truck right after to drive to [place] and wife felt something was wrong. Was also unable to talk to his wife. All that came out was gibberish. Wife gave him aspirin and took him to the ED. The episode lasted about 30 mins.  Workup with MRI Brain with no acute stroke, MR Angio head w/o contrast and Carotid duplex was negative for LVO.  LDL elevated to 144.  Hemoglobin A1c is pending.  Transthoracic echocardiogram is pending.  Has had a similar episode about 6 years ago that resolved with negative MRI. Was told that it was a TIA. No hx of seizures, son had benign nocturnal epilepsy, no hx of head trauma with LOC. No prior stroke, no prior CNS surgery or infection. No EtOH intake.    ROS   Constitutional Denies weight loss, fever and chills.   HEENT Denies changes in vision and hearing.   Respiratory Denies SOB and cough.   CV Denies palpitations and CP   GI Denies abdominal pain, nausea, vomiting and diarrhea.   GU Denies dysuria and urinary frequency.   MSK Denies myalgia and joint pain.   Skin Denies rash and pruritus.   Neurological Denies headache and syncope.   Psychiatric Denies recent changes in mood. Denies anxiety and depression.    Past History   Past Medical History:  Diagnosis Date  . TIA (transient ischemic attack)    Past Surgical History:  Procedure Laterality Date  . SHOULDER ARTHROSCOPY     Family History  Problem Relation Age of Onset   . Colon cancer Mother   . Heart disease Father    Social History   Socioeconomic History  . Marital status: Married    Spouse name: Not on file  . Number of children: Not on file  . Years of education: Not on file  . Highest education level: Not on file  Occupational History  . Not on file  Tobacco Use  . Smoking status: Never Smoker  . Smokeless tobacco: Never Used  Substance and Sexual Activity  . Alcohol use: Not Currently  . Drug use: Never  . Sexual activity: Yes  Other Topics Concern  . Not on file  Social History Narrative  . Not on file   Social Determinants of Health   Financial Resource Strain: Not on file  Food Insecurity: Not on file  Transportation Needs: Not on file  Physical Activity: Not on file  Stress: Not on file  Social Connections: Not on file   No Known Allergies  Medications   Medications Prior to Admission  Medication Sig Dispense Refill Last Dose  . cholecalciferol (VITAMIN D3) 25 MCG (1000 UNIT) tablet Take 1,000 Units by mouth daily.   10/28/2020 at Unknown time  . vitamin B-12 (CYANOCOBALAMIN) 1000 MCG tablet Take 1,000 mcg by mouth daily.   10/28/2020 at Unknown time  . vitamin C (  ASCORBIC ACID) 250 MG tablet Take 250 mg by mouth daily.   10/28/2020 at Unknown time     Vitals   Vitals:   10/28/20 2350 10/29/20 0245 10/29/20 0500 10/29/20 0728  BP: 138/72 131/77 124/67 122/66  Pulse: 70 78 78 98  Resp: 16 14    Temp: (!) 97.3 F (36.3 C) 97.8 F (36.6 C) 98.6 F (37 C) 98.7 F (37.1 C)  TempSrc: Oral Oral Oral Oral  SpO2: 97% 100% 98% 99%  Weight:      Height:         Body mass index is 21.59 kg/m.  Physical Exam   General: Laying comfortably in bed; in no acute distress.  HENT: Normal oropharynx and mucosa. Normal external appearance of ears and nose.  Neck: Supple, no pain or tenderness  CV: No JVD. No peripheral edema.  Pulmonary: Symmetric Chest rise. Normal respiratory effort.  Abdomen: Soft to touch, non-tender.   Ext: No cyanosis, edema, or deformity  Skin: No rash. Normal palpation of skin.   Musculoskeletal: Normal digits and nails by inspection. No clubbing.   Neurologic Examination  Mental status/Cognition: Alert, oriented to self, place, month and year, good attention.  Speech/language: Fluent, comprehension intact, object naming intact, repetition intact.  Cranial nerves:   CN II Pupils equal and reactive to light, no VF deficits    CN III,IV,VI EOM intact, no gaze preference or deviation, no nystagmus    CN V normal sensation in V1, V2, and V3 segments bilaterally    CN VII no asymmetry, no nasolabial fold flattening    CN VIII normal hearing to speech    CN IX & X normal palatal elevation, no uvular deviation    CN XI 5/5 head turn and 5/5 shoulder shrug bilaterally    CN XII midline tongue protrusion    Motor:  Muscle bulk: normal, tone normal, pronator drift none tremor none Mvmt Root Nerve  Muscle Right Left Comments  SA C5/6 Ax Deltoid 5 5   EF C5/6 Mc Biceps 5 5   EE C6/7/8 Rad Triceps 5 5   WF C6/7 Med FCR     WE C7/8 PIN ECU     F Ab C8/T1 U ADM/FDI 5 5   HF L1/2/3 Fem Illopsoas 5 5   KE L2/3/4 Fem Quad 5 5   DF L4/5 D Peron Tib Ant 5 5   PF S1/2 Tibial Grc/Sol 5 5    Reflexes:  Right Left Comments  Pectoralis      Biceps (C5/6) 1 1   Brachioradialis (C5/6) 1 1    Triceps (C6/7) 1 1    Patellar (L3/4) 1 1    Achilles (S1)      Hoffman      Plantar     Jaw jerk    Sensation:  Light touch intact   Pin prick    Temperature    Vibration   Proprioception    Coordination/Complex Motor:  - Finger to Nose intact - Heel to shin intact - Rapid alternating movement intact - Gait: Stride length normal. Arm swing normal. Base width normal  Labs   CBC:  Recent Labs  Lab 10/28/20 1551  WBC 7.0  HGB 15.0  HCT 42.5  MCV 99.5  PLT 161    Basic Metabolic Panel:  Lab Results  Component Value Date   NA 138 10/28/2020   K 4.1 10/28/2020   CO2 26 10/28/2020    GLUCOSE 157 (H) 10/28/2020   BUN  20 10/28/2020   CREATININE 1.10 10/28/2020   CALCIUM 9.0 10/28/2020   GFRNONAA >60 10/28/2020   GFRAA >60 06/11/2014   Lipid Panel:  Lab Results  Component Value Date   LDLCALC 144 (H) 10/29/2020   HgbA1c:  Lab Results  Component Value Date   HGBA1C 5.3 11/03/2013   Urine Drug Screen: No results found for: LABOPIA, COCAINSCRNUR, LABBENZ, AMPHETMU, THCU, LABBARB  Alcohol Level No results found for: ETH  MRI HEAD IMPRESSION: 1. No acute intracranial abnormality. 2. Mild age-related cerebral atrophy with chronic small vessel ischemic disease.  MRA HEAD IMPRESSION: 1. Negative intracranial MRA for large vessel occlusion. 2. Short-segment moderate stenoses involving the distal petrous left ICA and supraclinoid right ICA. 3. Otherwise negative intracranial MRA. No other hemodynamically significant or correctable stenosis identified.  Ultrasound carotid duplex: 1. Mild bilateral internal carotid artery atheromatous plaque without significant stenosis. Estimated 0-49% stenosis bilaterally.  rEEG: pending  Impression   Ruben [name] is a 75 y.o. male with PMH significant for TIA, SBO, HLD, gastritis, PUD, diverticulosis who presents with an episode concerning for aphasia that self resolved. MRI negative for a stroke. Primary suspicion is a TIA. Given that he has had a similar prior episode, will get a routine EEG to rule out epileptic aphasia. Recommendations  Plan:  - Frequent Neuro checks per stroke unit protocol - MRI Brain negative. - MRA and Carotid duplex neg. - TTE pending. - LDL was elevated to 144, started on Atorvastatin 80mg daily - HbA1c is pending. - Aspirin 81mg  daily - Recommend DVT ppx - SBP goal - permissive hypertension first 24 h < 220/110. Held home meds.  - Recommend Telemetry monitoring for arrythmia - Recommend bedside swallow screen prior to PO intake. - Stroke education booklet - Recommend PT/OT/SLP  consult - routine EEG. Due to issues with our EEG machine, unfortunately, may not be able to get inpatient without significant delay. He is eager to go home. Recommend rEEG outpatient within 1 week with follow up with neurology right after. ______________________________________________________________________   Thank you for the opportunity to take part in the care of this patient. If you have any further questions, please contact the neurology consultation attending.  Signed,  [place] Pager Number [phone] _ _ _   _ __   _ __ _ _  __ __   _ __   __ _

## 2020-10-29 NOTE — Progress Notes (Signed)
Patient with scheduled meds including lipitor, vitamin C, vitamin D and B12.  Patient took his scheduled aspirin 325 but refused all other medications stating that he will not take a statin and will take his vitamins when he gets home.  Dr Roosevelt Locks notified and educated patient about statin medication.  Patient offered the medication again after MD education and patient continued to refuse.  Dr Roosevelt Locks notified of patient's final refusal after education.

## 2020-10-29 NOTE — Discharge Summary (Addendum)
Physician Discharge Summary  Patient ID: Ruben [name] MRN: [id number] DOB/AGE: 10-15-1945 75 y.o.  Admit date: 10/28/2020 Discharge date: 10/29/2020  Admission Diagnoses:  Discharge Diagnoses:  Principal Problem:   TIA (transient ischemic attack) Active Problems:   Gastritis   PUD (peptic ulcer disease) Dyslipidemia  Discharged Condition: good  Hospital Course:  Ruben [name] is a 75 y.o. male with medical history significant of colon polyp, diverticulosis, peptic ulcer disease, gastritis, hyperlipidemia, SBO, CVA/TIA, glossal cancer who presents after episodes of difficulty with reading comprehension and aphasia.  Symptom has resolved after arriving the hospital.  Patient had a stroke work-up, MRI of the brain did not show any stroke, MR angiogram of the head and neck did not show significant occlusion.  Echocardiogram: , Patient has no symptom, he has significant elevation of LDL, he is placed on high-dose Lipitor, he is also started with aspirin.  At this point he is medically stable to be discharged. Please schedule a TEE at the next office visit per recommendation by neurology.    Consults: neurology  Significant Diagnostic Studies:  MRI HEAD WITHOUT CONTRAST  MRA HEAD WITHOUT CONTRAST  TECHNIQUE: Multiplanar, multiecho pulse sequences of the brain and surrounding structures were obtained without intravenous contrast. Angiographic images of the head were obtained using MRA technique without contrast.  COMPARISON:  Prior CT from earlier the same day.  FINDINGS: MRI HEAD FINDINGS  Brain: Mild age-related cerebral atrophy. Patchy T2/FLAIR hyperintensity within the periventricular deep white matter both cerebral hemispheres most consistent with chronic small vessel ischemic disease, mild in nature.  No abnormal foci of restricted diffusion to suggest acute or subacute ischemia. Gray-white matter differentiation maintained. No encephalomalacia to suggest  chronic cortical infarction. No foci of susceptibility artifact to suggest acute or chronic intracranial hemorrhage.  No mass lesion, midline shift or mass effect. No hydrocephalus or extra-axial fluid collection. Pituitary gland suprasellar region within normal limits. Midline structures intact.  Vascular: Major intracranial vascular flow voids are maintained.  Skull and upper cervical spine: Craniocervical junction within normal limits. Bone marrow signal intensity normal. No focal marrow replacing lesion. No scalp soft tissue abnormality.  Sinuses/Orbits: Globes normal soft tissues within normal limits. Paranasal sinuses are largely clear. No significant mastoid effusion. Inner ear structures grossly normal.  Other: None.  MRA HEAD FINDINGS  ANTERIOR CIRCULATION:  Distal cervical segments of the internal carotid arteries are widely patent with antegrade flow. Petrous right ICA widely patent. There is a short-segment moderate stenosis involving the distal petrous left ICA (series 1060, image 9). Left ICA otherwise patent to the terminus. Short-segment moderate stenosis noted at the supraclinoid right ICA (series 1016, image 9). Right ICA otherwise patent as well. A1 segments widely patent. Normal anterior communicating artery complex. Anterior cerebral arteries patent to their distal aspects without stenosis. No M1 stenosis or occlusion. Normal MCA bifurcations. Distal MCA branches well perfused and symmetric.  POSTERIOR CIRCULATION:  Both V4 segments widely patent to the vertebrobasilar junction. Left vertebral artery slightly dominant. Both PICA origins patent and normal. Basilar patent to its distal aspect without stenosis. Superior cerebellar arteries patent bilaterally. Both PCAs primarily supplied via the basilar well perfused or distal aspects.  No intracranial aneurysm.  IMPRESSION: MRI HEAD IMPRESSION:  1. No acute intracranial abnormality. 2.  Mild age-related cerebral atrophy with chronic small vessel ischemic disease.  MRA HEAD IMPRESSION:  1. Negative intracranial MRA for large vessel occlusion. 2. Short-segment moderate stenoses involving the distal petrous left ICA and supraclinoid right ICA. 3. Otherwise  negative intracranial MRA. No other hemodynamically significant or correctable stenosis identified.   Electronically Signed   By: Jeannine Boga M.D.   On: 10/28/2020 22:34 BILATERAL CAROTID DUPLEX ULTRASOUND  TECHNIQUE: Pearline Cables scale imaging, color Doppler and duplex ultrasound were performed of bilateral carotid and vertebral arteries in the neck.  COMPARISON:  11/02/2013  FINDINGS: Criteria: Quantification of carotid stenosis is based on velocity parameters that correlate the residual internal carotid diameter with NASCET-based stenosis levels, using the diameter of the distal internal carotid lumen as the denominator for stenosis measurement.  The following velocity measurements were obtained:  RIGHT  ICA: 86/21 cm/sec  CCA: 33/29 cm/sec  SYSTOLIC ICA/CCA RATIO:  1.0  ECA: 104 cm/sec  LEFT  ICA: 72/19 cm/sec  CCA: 51/88 cm/sec  SYSTOLIC ICA/CCA RATIO:  1.0  ECA: 102 cm/sec  RIGHT CAROTID ARTERY: Mild atheromatous plaque without significant stenosis.  RIGHT VERTEBRAL ARTERY:  Antegrade  LEFT CAROTID ARTERY: Mild atheromatous plaque without significant stenosis.  LEFT VERTEBRAL ARTERY:  Antegrade  IMPRESSION: 1. Mild bilateral internal carotid artery atheromatous plaque without significant stenosis. Estimated 0-49% stenosis bilaterally.   Electronically Signed   By: Randa Ngo M.D.   On: 10/28/2020 20:27 Echo:  1. Left ventricular ejection fraction, by estimation, is >55%. The left ventricle has normal function. Left ventricular endocardial border not optimally defined to evaluate regional wall motion. Left ventricular diastolic parameters were  normal. 2. Right ventricular systolic function is normal. The right ventricular size is normal. Tricuspid regurgitation signal is inadequate for assessing PA pressure. 3. The mitral valve was not well visualized. Trivial mitral valve regurgitation. No evidence of mitral stenosis. 4. The aortic valve was not well visualized. Aortic valve regurgitation is not visualized. No aortic stenosis is present. 5. The inferior vena cava is normal in size with greater than 50% respiratory variability, suggesting right atrial pressure of 3 mmHg.  Treatments: aspirin, lipitor  Discharge Exam: Blood pressure 130/68, pulse 74, temperature 98.4 F (36.9 C), temperature source Oral, resp. rate 16, height 5\' 8"  (1.727 m), weight 64.4 kg, SpO2 99 %. General appearance: alert and cooperative Resp: clear to auscultation bilaterally Cardio: regular rate and rhythm, S1, S2 normal, no murmur, click, rub or gallop GI: soft, non-tender; bowel sounds normal; no masses,  no organomegaly Extremities: extremities normal, atraumatic, no cyanosis or edema  Disposition: Discharge disposition: 01-Home or Self Care       Discharge Instructions    Diet - low sodium heart healthy   Complete by: As directed    Increase activity slowly   Complete by: As directed      Allergies as of 10/29/2020   No Known Allergies     Medication List    TAKE these medications   aspirin 81 MG EC tablet Take 1 tablet (81 mg total) by mouth daily. Swallow whole. Start taking on: October 30, 2020   atorvastatin 20 MG tablet Commonly known as: LIPITOR Take 4 tablets (80 mg total) by mouth daily. Start taking on: October 30, 2020   cholecalciferol 25 MCG (1000 UNIT) tablet Commonly known as: VITAMIN D3 Take 1,000 Units by mouth daily.   vitamin B-12 1000 MCG tablet Commonly known as: CYANOCOBALAMIN Take 1,000 mcg by mouth daily.   vitamin C 250 MG tablet Commonly known as: ASCORBIC ACID Take 250 mg by mouth daily.        Follow-up Information    [name], MD In 1 week.   Specialty: Family Medicine Why: office will call you for  a follow-up appointment. Contact information: [address] [facility] [place] [address] [phone]        [facility]. Go on 01/10/2021.   Why: at 10:30am Contact information: [place] [phone]              Signed: Sharen Hones 10/29/2020, 5:15 PM

## 2020-10-29 NOTE — Progress Notes (Signed)
OT Cancellation Note  Patient Details Name: Ruben [name] MRN: [id number] DOB: April 03, 1946   Cancelled Treatment:    Reason Eval/Treat Not Completed: OT screened, no needs identified, will sign off. Ruben [name] is up and dressed, moving freely w/in his room, A&Ox4, reading with good comprehension, talkative and speaking fluently, and reports being back to his baseline. Therapist provided stroke education, emphasizing the importance of seeking medical care at earliest onset of stroke-like symptoms (as pt did during this recent episode). No further acute OT services needed at this time. Ruben [name] does complain of some bilateral hand pain and limited ROM in fingers. Recommend outpt OT with a hand specialist.   Josiah Lobo, PhD, MS, OTR/L 10/29/20, 11:26 AM

## 2020-10-29 NOTE — Progress Notes (Signed)
PT Screen Note  Patient Details Name: Ruben [name] MRN: [id number] DOB: July 10, 1946   Cancelled Treatment:    Reason Eval/Treat Not Completed: PT screened, no needs identified, will sign off Pt reports that his symptoms only lasted ~30 minutes and that he has been feeling back to baseline since then.  He was easily able to circumambulate the nurses' station with good speed, confidence and safety.  Additionally he was able to go up/down flight of steps w/o issue.  Pt with symmetrical U&LE strength and showed good awareness - no PT needs identified, pt safe to return home, will sign off.   Kreg Shropshire, DPT 10/29/2020, 10:53 AM

## 2020-10-31 DIAGNOSIS — T466X5A Adverse effect of antihyperlipidemic and antiarteriosclerotic drugs, initial encounter: Secondary | ICD-10-CM | POA: Diagnosis not present

## 2020-10-31 DIAGNOSIS — E7800 Pure hypercholesterolemia, unspecified: Secondary | ICD-10-CM | POA: Diagnosis not present

## 2020-10-31 DIAGNOSIS — G459 Transient cerebral ischemic attack, unspecified: Secondary | ICD-10-CM | POA: Diagnosis not present

## 2020-10-31 DIAGNOSIS — Z79899 Other long term (current) drug therapy: Secondary | ICD-10-CM | POA: Diagnosis not present

## 2020-10-31 DIAGNOSIS — R4701 Aphasia: Secondary | ICD-10-CM | POA: Diagnosis not present

## 2020-10-31 DIAGNOSIS — M791 Myalgia, unspecified site: Secondary | ICD-10-CM | POA: Diagnosis not present

## 2021-01-10 DIAGNOSIS — I499 Cardiac arrhythmia, unspecified: Secondary | ICD-10-CM | POA: Diagnosis not present

## 2021-01-10 DIAGNOSIS — R29818 Other symptoms and signs involving the nervous system: Secondary | ICD-10-CM | POA: Diagnosis not present

## 2021-01-10 DIAGNOSIS — R9082 White matter disease, unspecified: Secondary | ICD-10-CM | POA: Diagnosis not present

## 2021-01-10 DIAGNOSIS — R0683 Snoring: Secondary | ICD-10-CM | POA: Diagnosis not present

## 2021-01-24 DIAGNOSIS — Z79899 Other long term (current) drug therapy: Secondary | ICD-10-CM | POA: Diagnosis not present

## 2021-01-24 DIAGNOSIS — E7800 Pure hypercholesterolemia, unspecified: Secondary | ICD-10-CM | POA: Diagnosis not present

## 2021-01-31 DIAGNOSIS — Z0000 Encounter for general adult medical examination without abnormal findings: Secondary | ICD-10-CM | POA: Diagnosis not present

## 2021-01-31 DIAGNOSIS — Z1331 Encounter for screening for depression: Secondary | ICD-10-CM | POA: Diagnosis not present

## 2021-02-24 DIAGNOSIS — Z9189 Other specified personal risk factors, not elsewhere classified: Secondary | ICD-10-CM | POA: Diagnosis not present

## 2021-02-24 DIAGNOSIS — R29818 Other symptoms and signs involving the nervous system: Secondary | ICD-10-CM | POA: Diagnosis not present

## 2021-02-24 DIAGNOSIS — E7800 Pure hypercholesterolemia, unspecified: Secondary | ICD-10-CM | POA: Diagnosis not present

## 2021-04-09 DIAGNOSIS — R29818 Other symptoms and signs involving the nervous system: Secondary | ICD-10-CM | POA: Diagnosis not present

## 2021-04-10 DIAGNOSIS — E7800 Pure hypercholesterolemia, unspecified: Secondary | ICD-10-CM | POA: Diagnosis not present

## 2021-04-10 DIAGNOSIS — Z23 Encounter for immunization: Secondary | ICD-10-CM | POA: Diagnosis not present

## 2021-04-10 DIAGNOSIS — R29818 Other symptoms and signs involving the nervous system: Secondary | ICD-10-CM | POA: Diagnosis not present

## 2021-04-10 DIAGNOSIS — I480 Paroxysmal atrial fibrillation: Secondary | ICD-10-CM | POA: Diagnosis not present

## 2021-07-10 DIAGNOSIS — E7800 Pure hypercholesterolemia, unspecified: Secondary | ICD-10-CM | POA: Diagnosis not present

## 2021-07-10 DIAGNOSIS — R29818 Other symptoms and signs involving the nervous system: Secondary | ICD-10-CM | POA: Diagnosis not present

## 2021-07-10 DIAGNOSIS — I480 Paroxysmal atrial fibrillation: Secondary | ICD-10-CM | POA: Diagnosis not present

## 2021-07-10 DIAGNOSIS — Z23 Encounter for immunization: Secondary | ICD-10-CM | POA: Diagnosis not present

## 2022-01-08 DIAGNOSIS — I480 Paroxysmal atrial fibrillation: Secondary | ICD-10-CM | POA: Diagnosis not present

## 2022-01-08 DIAGNOSIS — G459 Transient cerebral ischemic attack, unspecified: Secondary | ICD-10-CM | POA: Diagnosis not present

## 2022-01-08 DIAGNOSIS — E7800 Pure hypercholesterolemia, unspecified: Secondary | ICD-10-CM | POA: Diagnosis not present

## 2022-01-29 DIAGNOSIS — Z79899 Other long term (current) drug therapy: Secondary | ICD-10-CM | POA: Diagnosis not present

## 2022-01-29 DIAGNOSIS — Z125 Encounter for screening for malignant neoplasm of prostate: Secondary | ICD-10-CM | POA: Diagnosis not present

## 2022-01-29 DIAGNOSIS — E7800 Pure hypercholesterolemia, unspecified: Secondary | ICD-10-CM | POA: Diagnosis not present

## 2022-02-05 DIAGNOSIS — Z1331 Encounter for screening for depression: Secondary | ICD-10-CM | POA: Diagnosis not present

## 2022-02-05 DIAGNOSIS — Z0000 Encounter for general adult medical examination without abnormal findings: Secondary | ICD-10-CM | POA: Diagnosis not present

## 2022-06-05 IMAGING — CT CT HEAD W/O CM
3 series · 15 of 47 positions shown, 18 images · non-contrast
Comparison: 11/02/2013

CLINICAL DATA: Aphasia and confusion, symptoms since [DATE] which
have resolved

EXAM:
CT HEAD WITHOUT CONTRAST
TECHNIQUE: Contiguous axial images were obtained from the base of the skull
through the vertex without intravenous contrast.

[Series 3: head wo · axial · 0.42mm/px · z∈[+347,+472]mm · 9 of 31 slices shown, 12 images]
[im 3/31  brain]
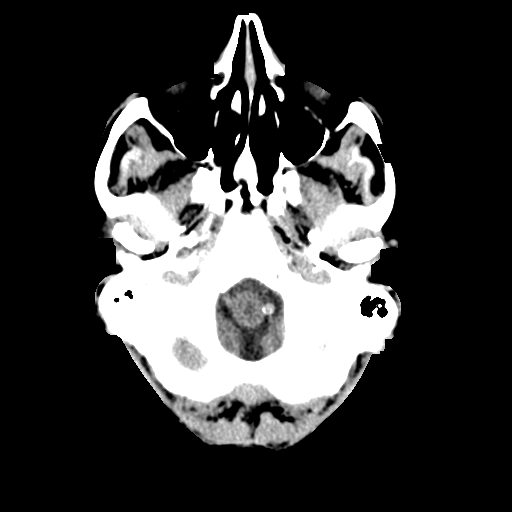
[im 3/31  bone]
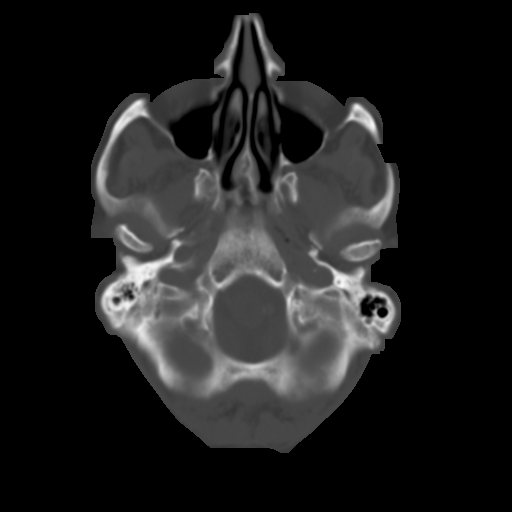
[im 6/31  brain]
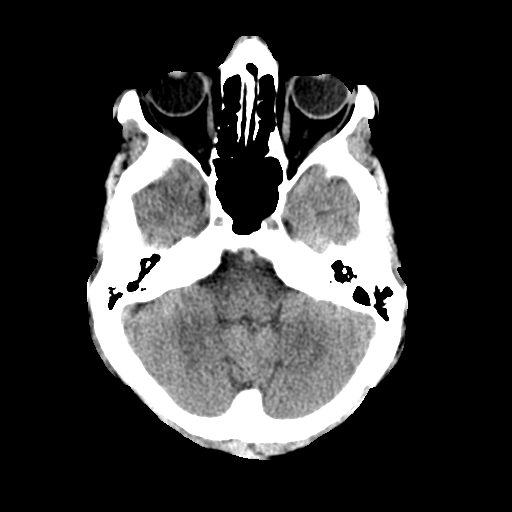
[im 9/31  brain]
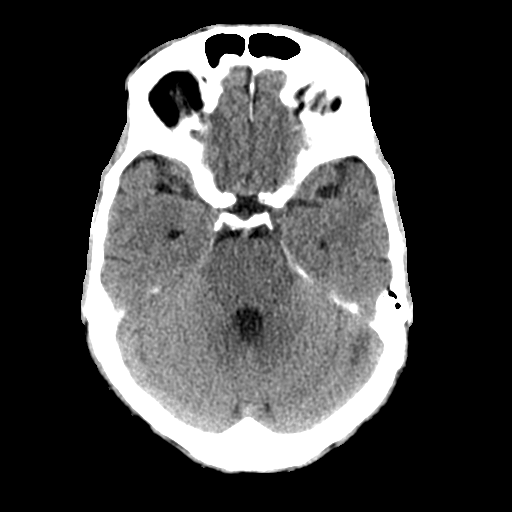
[im 12/31  brain]
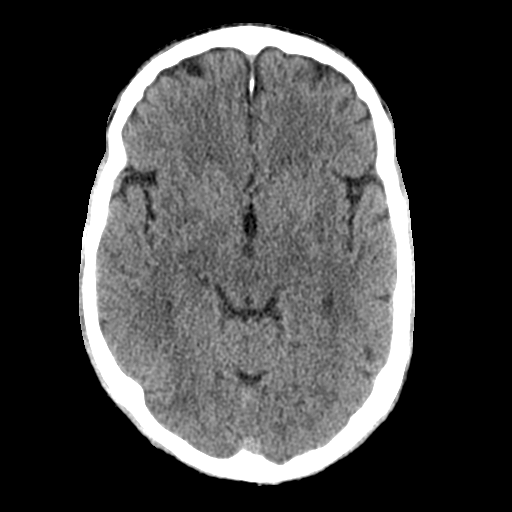
[im 16/31  brain]
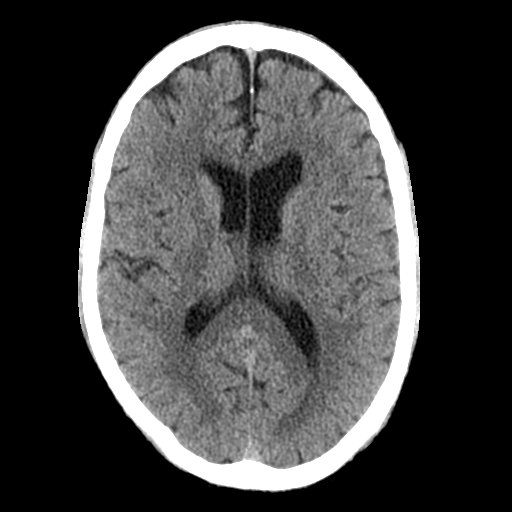
[im 16/31  bone]
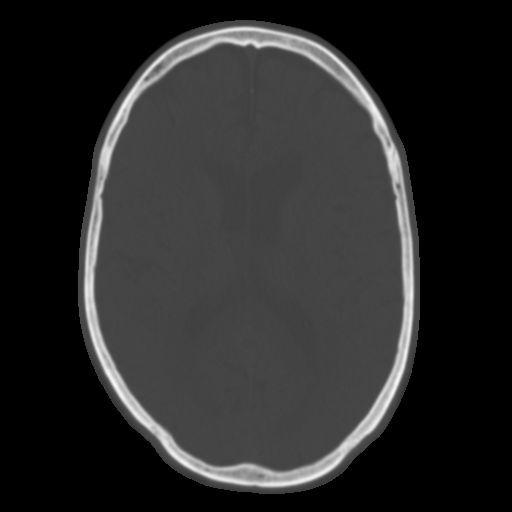
[im 19/31  brain]
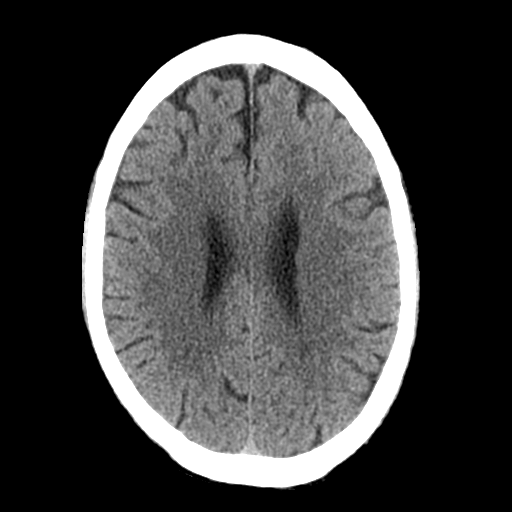
[im 22/31  brain]
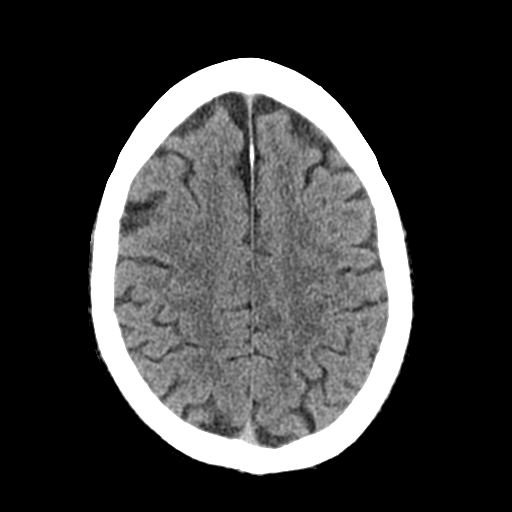
[im 25/31  brain]
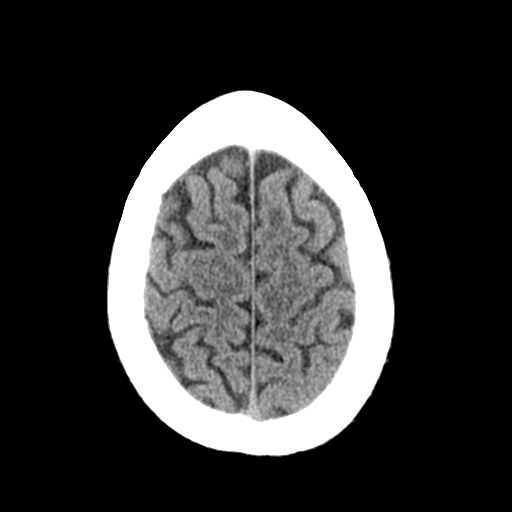
[im 28/31  brain]
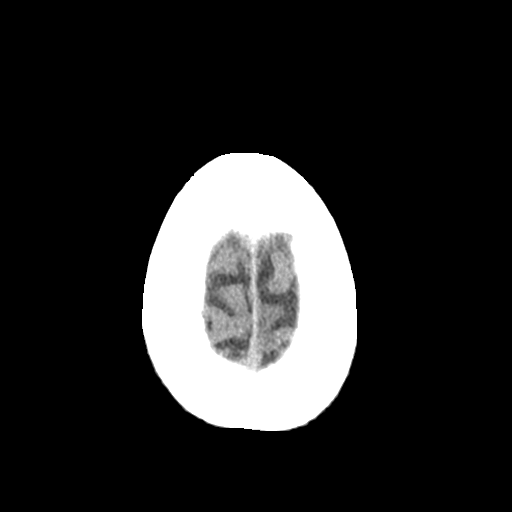
[im 28/31  bone]
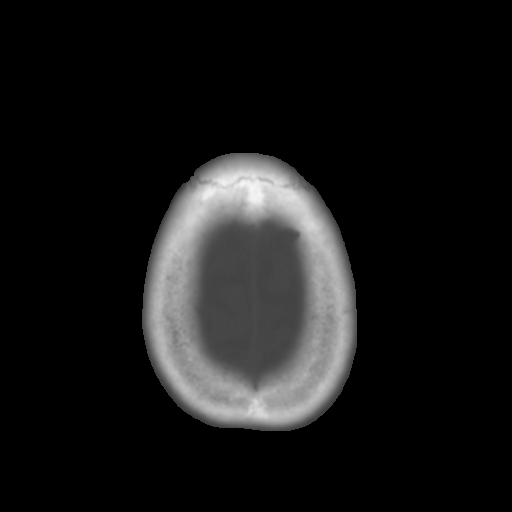

[Series 4: coronal soft tissue · coronal · 0.30mm/px · 3 of 69 slices shown]
[im 23/69  brain]
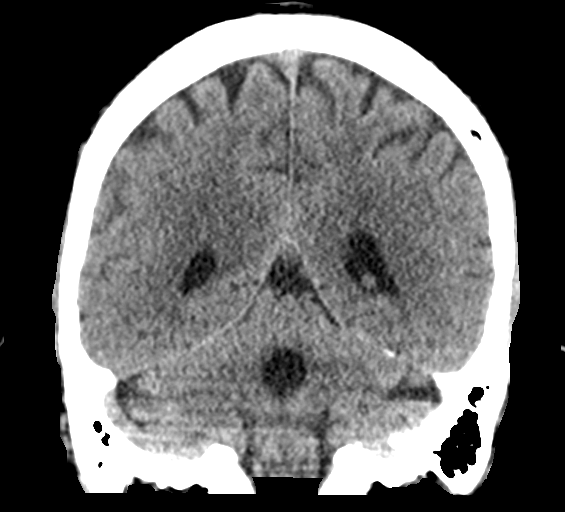
[im 31/69  brain]
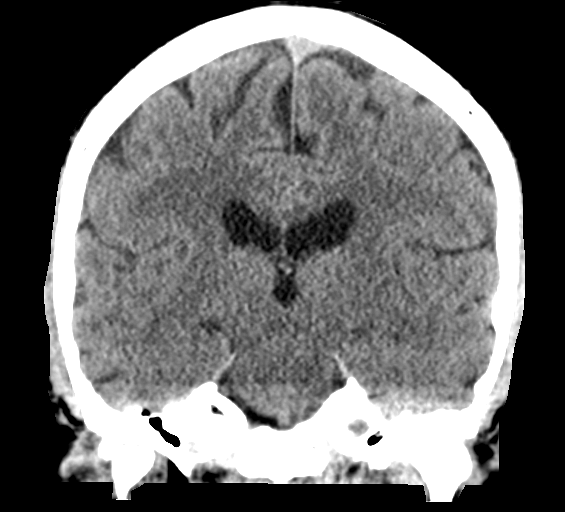
[im 38/69  brain]
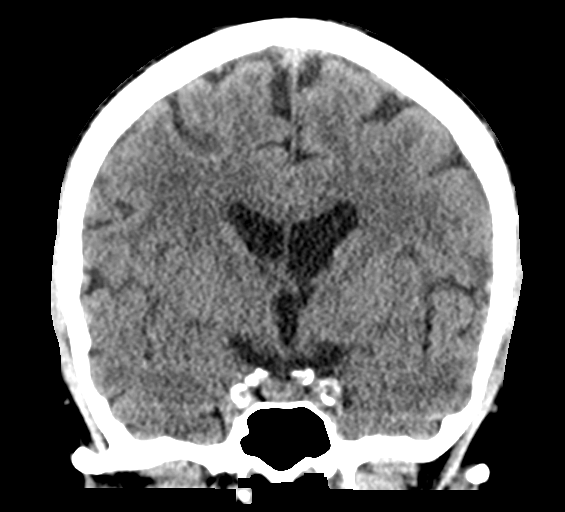

[Series 5: sagittal soft tissue · sagittal · 0.30mm/px · 3 of 58 slices shown]
[im 20/58  brain]
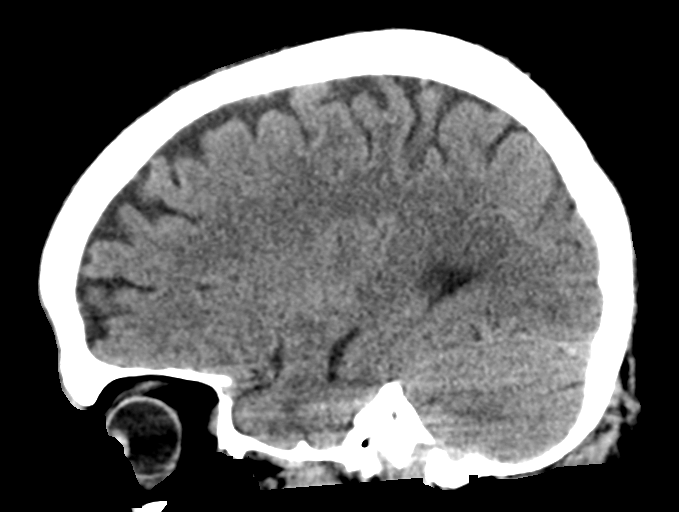
[im 29/58  brain]
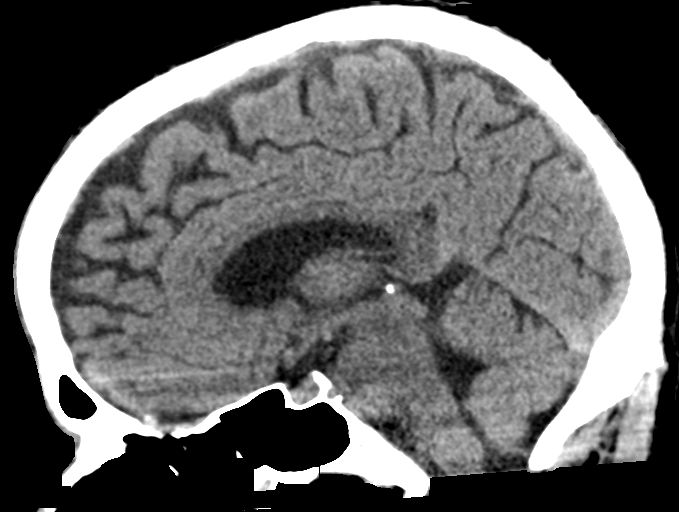
[im 39/58  brain]
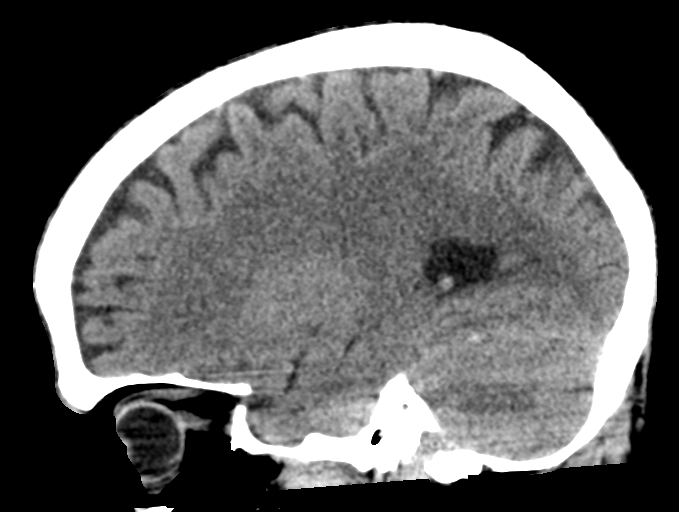

[15 of 47 positions shown; findings below may reference images not displayed]

FINDINGS: Brain: No acute infarct or hemorrhage. Lateral ventricles and
midline structures are unremarkable. No acute extra-axial fluid
collections. No mass effect.

Vascular: No hyperdense vessel or unexpected calcification.

Skull: Normal. Negative for fracture or focal lesion.

Sinuses/Orbits: Chronic opacification posterior left ethmoid air
cell. Otherwise the paranasal sinuses are clear.

Other: None.
IMPRESSION: 1. No acute intracranial process.

## 2022-06-05 IMAGING — US US CAROTID DUPLEX BILAT
1 series · 14 of 24 positions shown · non-contrast
Comparison: 11/02/2013

CLINICAL DATA: TIA symptoms

EXAM:
BILATERAL CAROTID DUPLEX ULTRASOUND
TECHNIQUE: Gray scale imaging, color Doppler and duplex ultrasound were
performed of bilateral carotid and vertebral arteries in the neck.

[Series 1: us carotid bilateral · 14 of 91 slices shown]
[im 1/91]
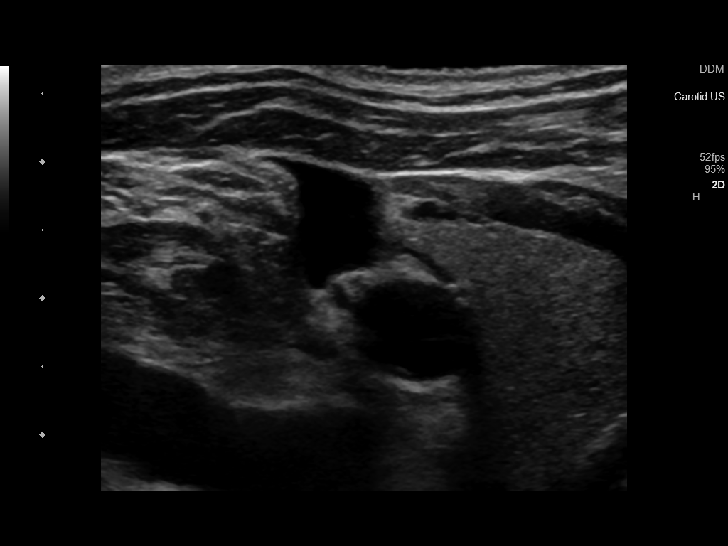
[im 8/91]
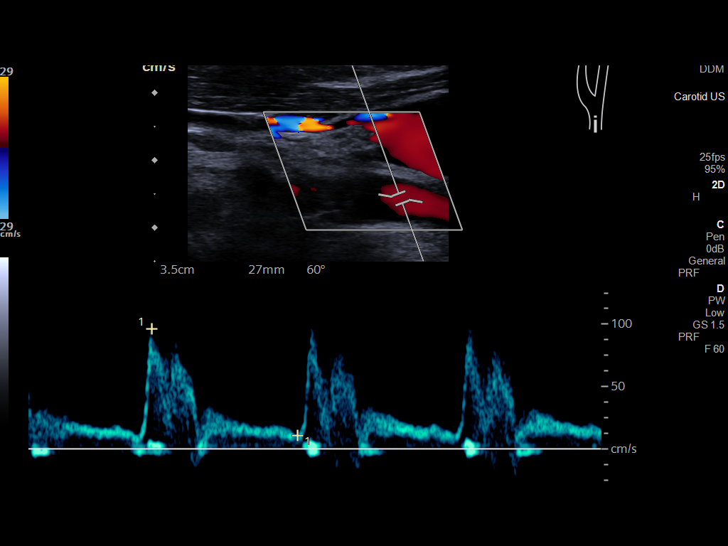
[im 16/91]
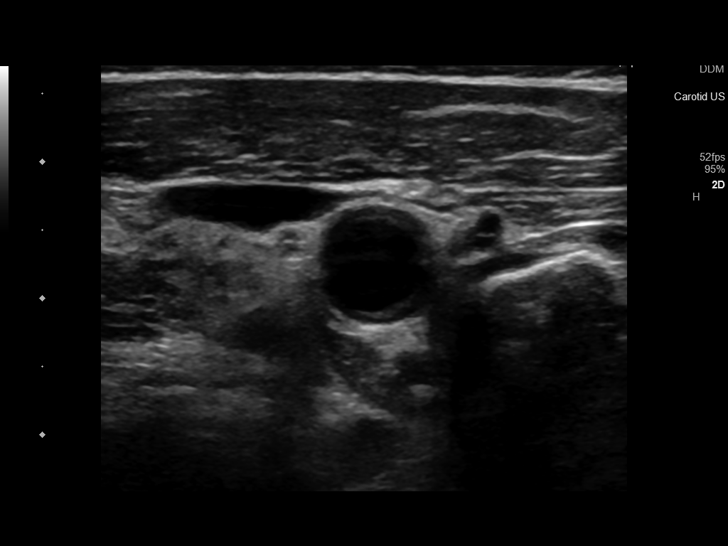
[im 24/91]
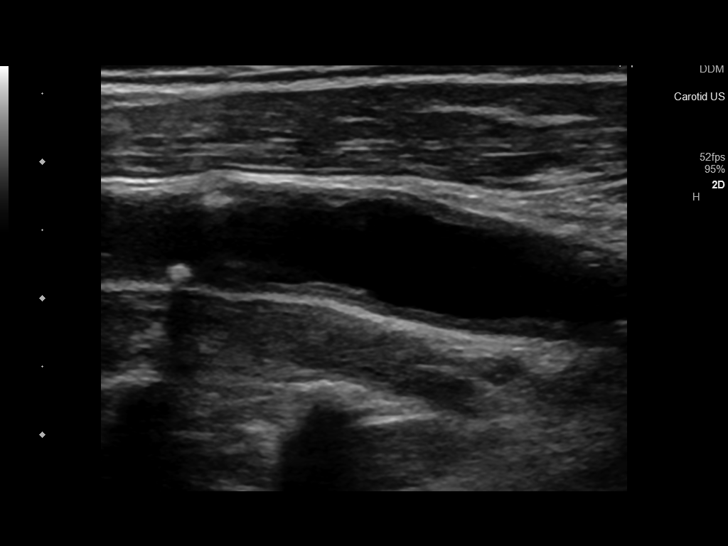
[im 28/91]
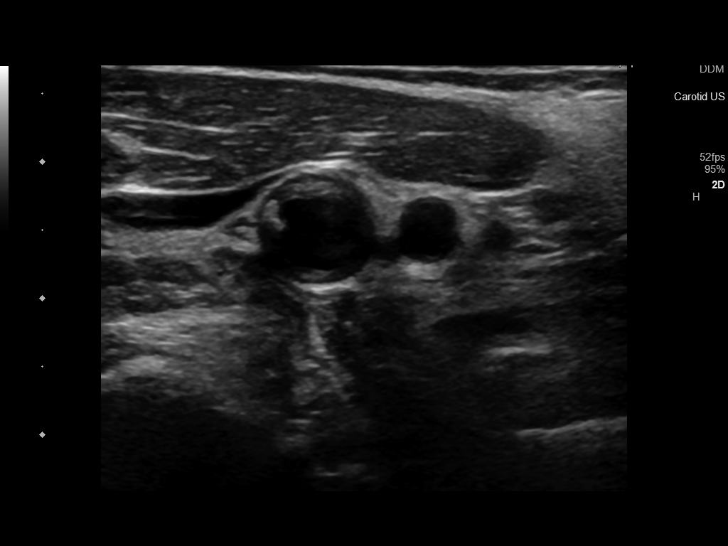
[im 36/91]
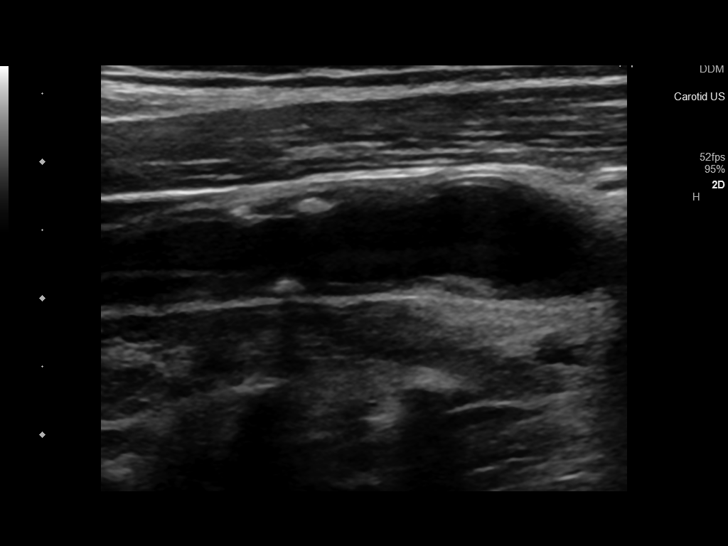
[im 44/91]
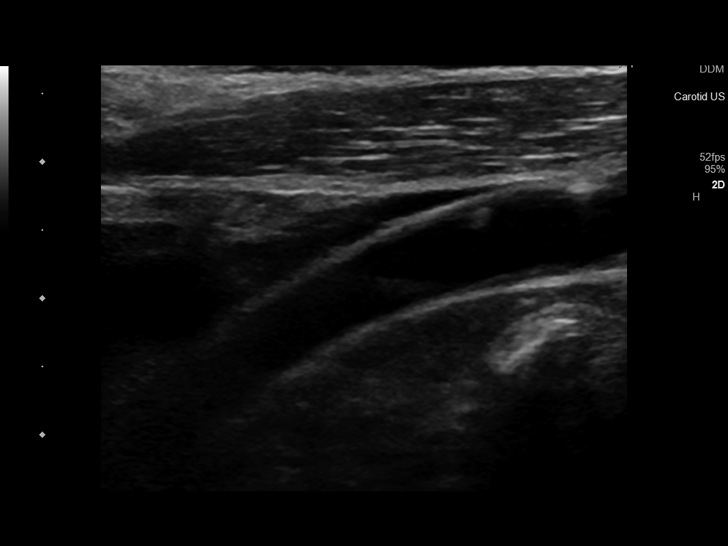
[im 47/91]
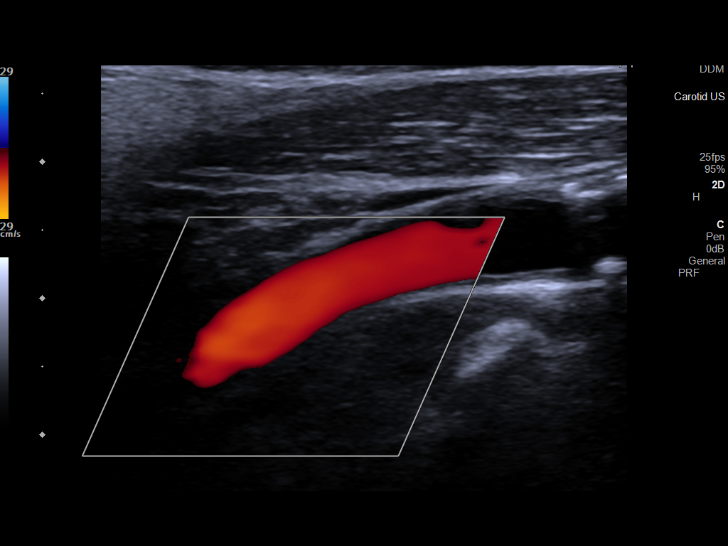
[im 55/91]
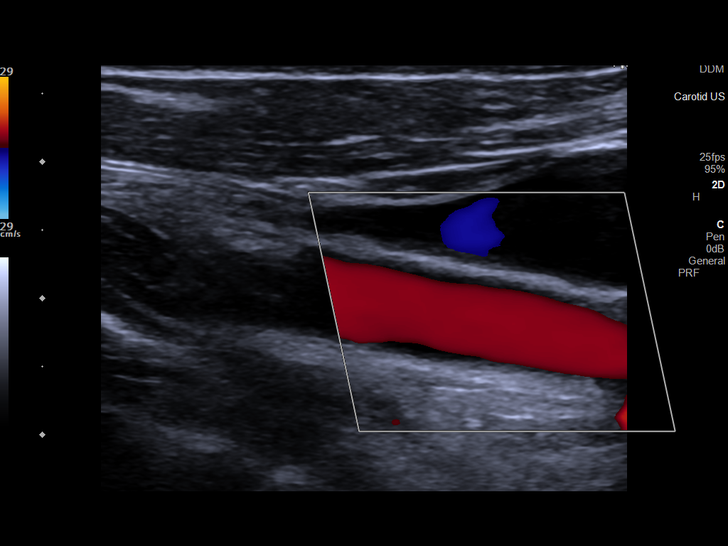
[im 63/91]
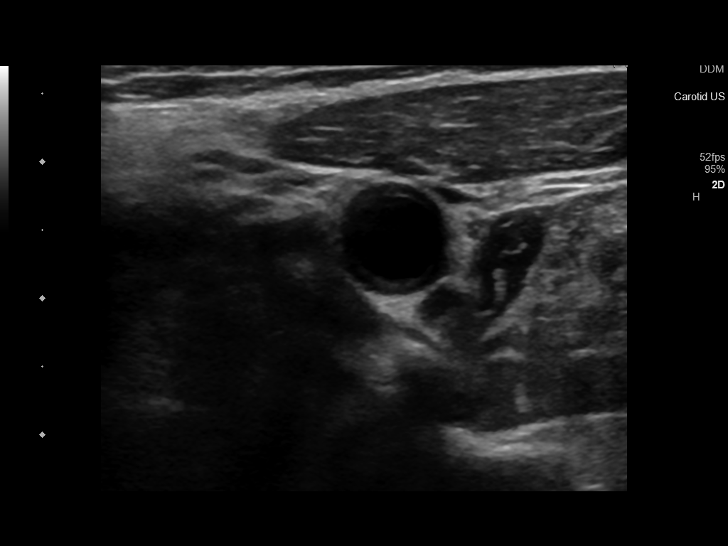
[im 71/91]
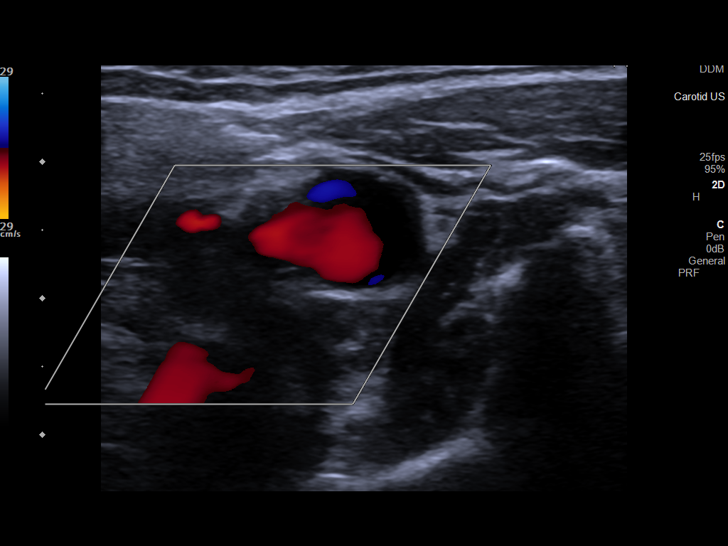
[im 75/91]
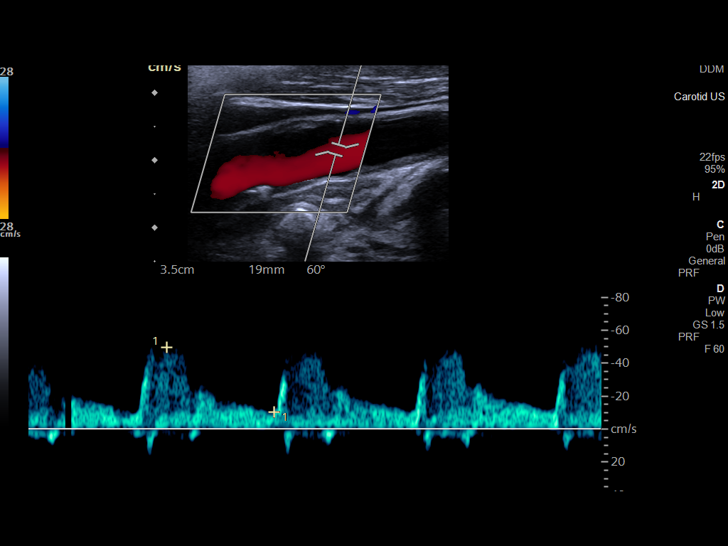
[im 83/91]
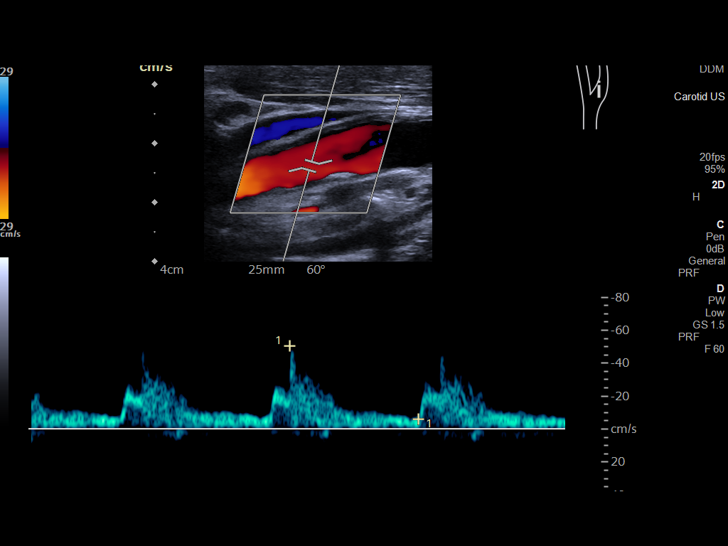
[im 91/91]
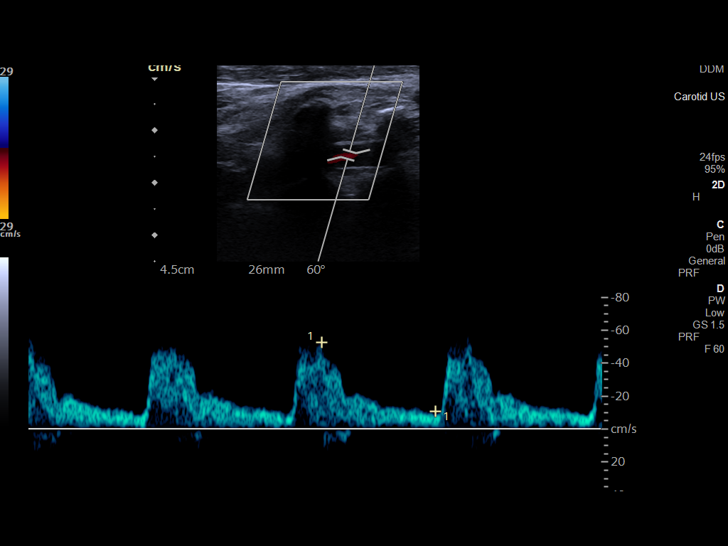

[14 of 24 positions shown; findings below may reference images not displayed]

FINDINGS: Criteria: Quantification of carotid stenosis is based on velocity
parameters that correlate the residual internal carotid diameter
with NASCET-based stenosis levels, using the diameter of the distal
internal carotid lumen as the denominator for stenosis measurement.

The following velocity measurements were obtained:

RIGHT

ICA: 86/21 cm/sec

CCA: 83/13 cm/sec

SYSTOLIC ICA/CCA RATIO:

ECA: 104 cm/sec

LEFT

ICA: 72/19 cm/sec

CCA: 71/14 cm/sec

SYSTOLIC ICA/CCA RATIO:

ECA: 102 cm/sec

RIGHT CAROTID ARTERY: Mild atheromatous plaque without significant
stenosis.

RIGHT VERTEBRAL ARTERY:  Antegrade

LEFT CAROTID ARTERY: Mild atheromatous plaque without significant
stenosis.

LEFT VERTEBRAL ARTERY:  Antegrade
IMPRESSION: 1. Mild bilateral internal carotid artery atheromatous plaque
without significant stenosis. Estimated 0-49% stenosis bilaterally.

## 2022-07-09 DIAGNOSIS — Z23 Encounter for immunization: Secondary | ICD-10-CM | POA: Diagnosis not present

## 2022-07-09 DIAGNOSIS — R29818 Other symptoms and signs involving the nervous system: Secondary | ICD-10-CM | POA: Diagnosis not present

## 2022-07-09 DIAGNOSIS — G459 Transient cerebral ischemic attack, unspecified: Secondary | ICD-10-CM | POA: Diagnosis not present

## 2022-07-09 DIAGNOSIS — I480 Paroxysmal atrial fibrillation: Secondary | ICD-10-CM | POA: Diagnosis not present

## 2022-07-09 DIAGNOSIS — E7800 Pure hypercholesterolemia, unspecified: Secondary | ICD-10-CM | POA: Diagnosis not present

## 2022-08-10 DIAGNOSIS — E7800 Pure hypercholesterolemia, unspecified: Secondary | ICD-10-CM | POA: Diagnosis not present

## 2022-08-10 DIAGNOSIS — Z79899 Other long term (current) drug therapy: Secondary | ICD-10-CM | POA: Diagnosis not present

## 2022-08-17 DIAGNOSIS — I480 Paroxysmal atrial fibrillation: Secondary | ICD-10-CM | POA: Diagnosis not present

## 2022-08-17 DIAGNOSIS — E7800 Pure hypercholesterolemia, unspecified: Secondary | ICD-10-CM | POA: Diagnosis not present

## 2022-08-17 DIAGNOSIS — D0007 Carcinoma in situ of tongue: Secondary | ICD-10-CM | POA: Diagnosis not present

## 2022-08-17 DIAGNOSIS — Z125 Encounter for screening for malignant neoplasm of prostate: Secondary | ICD-10-CM | POA: Diagnosis not present

## 2022-08-17 DIAGNOSIS — Z79899 Other long term (current) drug therapy: Secondary | ICD-10-CM | POA: Diagnosis not present

## 2022-09-16 DIAGNOSIS — Z85810 Personal history of malignant neoplasm of tongue: Secondary | ICD-10-CM | POA: Diagnosis not present

## 2022-09-16 DIAGNOSIS — R682 Dry mouth, unspecified: Secondary | ICD-10-CM | POA: Diagnosis not present

## 2022-10-02 ENCOUNTER — Other Ambulatory Visit (HOSPITAL_COMMUNITY): Payer: Self-pay

## 2022-10-02 ENCOUNTER — Emergency Department: Payer: PPO

## 2022-10-02 ENCOUNTER — Encounter
Admission: EM | Disposition: A | Discharge status: Other Acute Inpatient Hospital | Payer: Self-pay | Source: Home / Self Care | Attending: Family Medicine

## 2022-10-02 ENCOUNTER — Telehealth (HOSPITAL_COMMUNITY): Payer: Self-pay

## 2022-10-02 ENCOUNTER — Inpatient Hospital Stay
Admission: EM | Admit: 2022-10-02 | Discharge: 2022-10-02 | DRG: 281 | Disposition: A | Discharge status: Other Acute Inpatient Hospital | Payer: PPO | Attending: Family Medicine | Admitting: Family Medicine

## 2022-10-02 ENCOUNTER — Inpatient Hospital Stay
Admit: 2022-10-02 | Discharge: 2022-10-02 | Disposition: A | Discharge status: Home / Self Care | Payer: PPO | Attending: Cardiology | Admitting: Cardiology

## 2022-10-02 ENCOUNTER — Encounter (HOSPITAL_COMMUNITY): Payer: Self-pay

## 2022-10-02 DIAGNOSIS — I482 Chronic atrial fibrillation, unspecified: Secondary | ICD-10-CM

## 2022-10-02 DIAGNOSIS — Z885 Allergy status to narcotic agent status: Secondary | ICD-10-CM | POA: Diagnosis not present

## 2022-10-02 DIAGNOSIS — K279 Peptic ulcer, site unspecified, unspecified as acute or chronic, without hemorrhage or perforation: Secondary | ICD-10-CM | POA: Diagnosis not present

## 2022-10-02 DIAGNOSIS — Z951 Presence of aortocoronary bypass graft: Secondary | ICD-10-CM

## 2022-10-02 DIAGNOSIS — Z8673 Personal history of transient ischemic attack (TIA), and cerebral infarction without residual deficits: Secondary | ICD-10-CM | POA: Diagnosis not present

## 2022-10-02 DIAGNOSIS — R000 Tachycardia, unspecified: Secondary | ICD-10-CM | POA: Insufficient documentation

## 2022-10-02 DIAGNOSIS — Z7982 Long term (current) use of aspirin: Secondary | ICD-10-CM

## 2022-10-02 DIAGNOSIS — I5033 Acute on chronic diastolic (congestive) heart failure: Secondary | ICD-10-CM | POA: Diagnosis not present

## 2022-10-02 DIAGNOSIS — E785 Hyperlipidemia, unspecified: Secondary | ICD-10-CM

## 2022-10-02 DIAGNOSIS — I480 Paroxysmal atrial fibrillation: Secondary | ICD-10-CM | POA: Diagnosis not present

## 2022-10-02 DIAGNOSIS — G459 Transient cerebral ischemic attack, unspecified: Secondary | ICD-10-CM | POA: Diagnosis not present

## 2022-10-02 DIAGNOSIS — R002 Palpitations: Principal | ICD-10-CM

## 2022-10-02 DIAGNOSIS — Z79899 Other long term (current) drug therapy: Secondary | ICD-10-CM

## 2022-10-02 DIAGNOSIS — Z8249 Family history of ischemic heart disease and other diseases of the circulatory system: Secondary | ICD-10-CM | POA: Diagnosis not present

## 2022-10-02 DIAGNOSIS — I2119 ST elevation (STEMI) myocardial infarction involving other coronary artery of inferior wall: Secondary | ICD-10-CM | POA: Diagnosis not present

## 2022-10-02 DIAGNOSIS — I214 Non-ST elevation (NSTEMI) myocardial infarction: Secondary | ICD-10-CM | POA: Diagnosis not present

## 2022-10-02 DIAGNOSIS — J90 Pleural effusion, not elsewhere classified: Secondary | ICD-10-CM | POA: Diagnosis not present

## 2022-10-02 DIAGNOSIS — I252 Old myocardial infarction: Secondary | ICD-10-CM | POA: Diagnosis not present

## 2022-10-02 DIAGNOSIS — I509 Heart failure, unspecified: Secondary | ICD-10-CM | POA: Diagnosis not present

## 2022-10-02 DIAGNOSIS — I2510 Atherosclerotic heart disease of native coronary artery without angina pectoris: Secondary | ICD-10-CM | POA: Diagnosis not present

## 2022-10-02 DIAGNOSIS — R0601 Orthopnea: Secondary | ICD-10-CM | POA: Diagnosis not present

## 2022-10-02 DIAGNOSIS — Z91148 Patient's other noncompliance with medication regimen for other reason: Secondary | ICD-10-CM

## 2022-10-02 DIAGNOSIS — Z8711 Personal history of peptic ulcer disease: Secondary | ICD-10-CM

## 2022-10-02 HISTORY — PX: LEFT HEART CATH AND CORONARY ANGIOGRAPHY: CATH118249

## 2022-10-02 LAB — BASIC METABOLIC PANEL
Anion gap: 11 (ref 5–15)
BUN: 13 mg/dL (ref 8–23)
CO2: 25 mmol/L (ref 22–32)
Calcium: 8.7 mg/dL — ABNORMAL LOW (ref 8.9–10.3)
Chloride: 104 mmol/L (ref 98–111)
Creatinine, Ser: 0.96 mg/dL (ref 0.61–1.24)
GFR, Estimated: >60 mL/min (ref >60)
Glucose, Bld: 104 mg/dL — ABNORMAL HIGH (ref 70–99)
Potassium: 3.6 mmol/L (ref 3.5–5.1)
Sodium: 140 mmol/L (ref 135–145)

## 2022-10-02 LAB — CBC
HCT: 38.6 % — ABNORMAL LOW (ref 39.0–52.0)
Hemoglobin: 13 g/dL (ref 13.0–17.0)
MCH: 35.1 pg — ABNORMAL HIGH (ref 26.0–34.0)
MCHC: 33.7 g/dL (ref 30.0–36.0)
MCV: 104.3 fL — ABNORMAL HIGH (ref 80.0–100.0)
Platelets: 177 10*3/uL (ref 150–400)
RBC: 3.7 MIL/uL — ABNORMAL LOW (ref 4.22–5.81)
RDW: 11.8 % (ref 11.5–15.5)
WBC: 4.7 10*3/uL (ref 4.0–10.5)
nRBC: 0 % (ref 0.0–0.2)

## 2022-10-02 LAB — ECHOCARDIOGRAM COMPLETE
AR max vel: 2.57 cm2
AV Area VTI: 2.51 cm2
AV Area mean vel: 2.44 cm2
AV Mean grad: 2 mmHg
AV Peak grad: 3.2 mmHg
Ao pk vel: 0.89 m/s
Area-P 1/2: 2.74 cm2
Height: 68 in
MV VTI: 1.68 cm2
S' Lateral: 2 cm
Weight: 2960 oz

## 2022-10-02 LAB — TROPONIN I (HIGH SENSITIVITY)
Troponin I (High Sensitivity): 187 ng/L (ref <18)
Troponin I (High Sensitivity): 1346 ng/L (ref <18)
Troponin I (High Sensitivity): 6534 ng/L (ref <18)
Troponin I (High Sensitivity): 6270 ng/L (ref <18)

## 2022-10-02 LAB — HEPARIN LEVEL (UNFRACTIONATED): Heparin Unfractionated: 0.66 IU/mL (ref 0.30–0.70)

## 2022-10-02 LAB — PROTIME-INR
INR: 1 (ref 0.8–1.2)
Prothrombin Time: 13.1 seconds (ref 11.4–15.2)

## 2022-10-02 LAB — CARDIAC CATHETERIZATION: Cath EF Quantitative: 55 %

## 2022-10-02 LAB — APTT: aPTT: 34 seconds (ref 24–36)

## 2022-10-02 SURGERY — LEFT HEART CATH AND CORONARY ANGIOGRAPHY
Anesthesia: Moderate Sedation

## 2022-10-02 MED ORDER — SODIUM CHLORIDE 0.9 % WEIGHT BASED INFUSION: 1.0000 mL/kg/h | INTRAVENOUS | Status: DC

## 2022-10-02 MED ORDER — MIDAZOLAM HCL 2 MG/2ML IJ SOLN
INTRAMUSCULAR | Status: DC | PRN
  Administered 2022-10-02: 1 mg via INTRAVENOUS

## 2022-10-02 MED ORDER — ROSUVASTATIN CALCIUM 10 MG PO TABS
20.0000 mg | ORAL_TABLET | Freq: Every day | ORAL | Status: DC
  Administered 2022-10-02: 20 mg via ORAL
  Filled 2022-10-02: qty 2

## 2022-10-02 MED ORDER — ONDANSETRON HCL 4 MG/2ML IJ SOLN: 4.0000 mg | Freq: Four times a day (QID) | INTRAMUSCULAR | Status: DC | PRN

## 2022-10-02 MED ORDER — ACETAMINOPHEN 325 MG PO TABS: 650.0000 mg | ORAL_TABLET | ORAL | Status: DC | PRN

## 2022-10-02 MED ORDER — LABETALOL HCL 5 MG/ML IV SOLN: 10.0000 mg | INTRAVENOUS | Status: AC | PRN

## 2022-10-02 MED ORDER — SODIUM CHLORIDE 0.9 % WEIGHT BASED INFUSION
1.0000 mL/kg/h | INTRAVENOUS | Status: DC
  Administered 2022-10-02: 1 mL/kg/h via INTRAVENOUS

## 2022-10-02 MED ORDER — VERAPAMIL HCL 2.5 MG/ML IV SOLN
INTRAVENOUS | Status: AC
  Filled 2022-10-02: qty 2

## 2022-10-02 MED ORDER — HEPARIN (PORCINE) 25000 UT/250ML-% IV SOLN
1100.0000 [IU]/h | INTRAVENOUS | Status: DC
  Administered 2022-10-02: 1100 [IU]/h via INTRAVENOUS

## 2022-10-02 MED ORDER — HEPARIN (PORCINE) IN NACL 2000-0.9 UNIT/L-% IV SOLN
INTRAVENOUS | Status: DC | PRN
  Administered 2022-10-02: 1000 mL

## 2022-10-02 MED ORDER — HEPARIN (PORCINE) 25000 UT/250ML-% IV SOLN
1100.0000 [IU]/h | INTRAVENOUS | Status: DC
  Administered 2022-10-02: 1100 [IU]/h via INTRAVENOUS
  Filled 2022-10-02: qty 250

## 2022-10-02 MED ORDER — PANTOPRAZOLE SODIUM 40 MG IV SOLR
40.0000 mg | Freq: Two times a day (BID) | INTRAVENOUS | Status: DC
  Administered 2022-10-02 (×2): 40 mg via INTRAVENOUS
  Filled 2022-10-02 (×2): qty 10

## 2022-10-02 MED ORDER — NITROGLYCERIN 0.4 MG SL SUBL: 0.4000 mg | SUBLINGUAL_TABLET | SUBLINGUAL | Status: DC | PRN

## 2022-10-02 MED ORDER — ASPIRIN 81 MG PO TBEC: 81.0000 mg | DELAYED_RELEASE_TABLET | Freq: Every day | ORAL | Status: DC

## 2022-10-02 MED ORDER — SODIUM CHLORIDE 0.9 % IV SOLN: 250.0000 mL | INTRAVENOUS | Status: DC | PRN

## 2022-10-02 MED ORDER — METOPROLOL TARTRATE 5 MG/5ML IV SOLN
5.0000 mg | Freq: Once | INTRAVENOUS | Status: AC
  Administered 2022-10-02: 5 mg via INTRAVENOUS
  Filled 2022-10-02: qty 5

## 2022-10-02 MED ORDER — HYDRALAZINE HCL 20 MG/ML IJ SOLN: 10.0000 mg | INTRAMUSCULAR | Status: AC | PRN

## 2022-10-02 MED ORDER — IOHEXOL 300 MG/ML  SOLN
INTRAMUSCULAR | Status: DC | PRN
  Administered 2022-10-02: 92 mL

## 2022-10-02 MED ORDER — HEPARIN (PORCINE) 25000 UT/250ML-% IV SOLN: 1100.0000 [IU]/h | INTRAVENOUS | 3 refills | Status: DC

## 2022-10-02 MED ORDER — SODIUM CHLORIDE 0.9% FLUSH: 3.0000 mL | Freq: Two times a day (BID) | INTRAVENOUS | Status: DC

## 2022-10-02 MED ORDER — HEPARIN SODIUM (PORCINE) 1000 UNIT/ML IJ SOLN
INTRAMUSCULAR | Status: DC | PRN
  Administered 2022-10-02: 3000 [IU] via INTRAVENOUS

## 2022-10-02 MED ORDER — HEPARIN SODIUM (PORCINE) 1000 UNIT/ML IJ SOLN
INTRAMUSCULAR | Status: AC
  Filled 2022-10-02: qty 10

## 2022-10-02 MED ORDER — FENTANYL CITRATE (PF) 100 MCG/2ML IJ SOLN
INTRAMUSCULAR | Status: DC | PRN
  Administered 2022-10-02: 25 ug via INTRAVENOUS

## 2022-10-02 MED ORDER — SODIUM CHLORIDE 0.9% FLUSH: 3.0000 mL | INTRAVENOUS | Status: DC | PRN

## 2022-10-02 MED ORDER — SODIUM CHLORIDE 0.9% FLUSH
3.0000 mL | Freq: Two times a day (BID) | INTRAVENOUS | Status: DC
  Administered 2022-10-02: 3 mL via INTRAVENOUS

## 2022-10-02 MED ORDER — VERAPAMIL HCL 2.5 MG/ML IV SOLN
INTRAVENOUS | Status: DC | PRN
  Administered 2022-10-02: 2.5 mg via INTRA_ARTERIAL

## 2022-10-02 MED ORDER — HEPARIN BOLUS VIA INFUSION
4000.0000 [IU] | Freq: Once | INTRAVENOUS | Status: AC
  Administered 2022-10-02: 4000 [IU] via INTRAVENOUS
  Filled 2022-10-02: qty 4000

## 2022-10-02 MED ORDER — ROSUVASTATIN CALCIUM 20 MG PO TABS: 40.0000 mg | ORAL_TABLET | Freq: Every day | ORAL | 11 refills | Status: AC

## 2022-10-02 MED ORDER — SODIUM CHLORIDE 0.9 % IV SOLN: INTRAVENOUS | Status: DC

## 2022-10-02 MED ORDER — MIDAZOLAM HCL 2 MG/2ML IJ SOLN
INTRAMUSCULAR | Status: AC
  Filled 2022-10-02: qty 2

## 2022-10-02 MED ORDER — FENTANYL CITRATE (PF) 100 MCG/2ML IJ SOLN
INTRAMUSCULAR | Status: AC
  Filled 2022-10-02: qty 2

## 2022-10-02 MED ORDER — ASPIRIN 81 MG PO CHEW
81.0000 mg | CHEWABLE_TABLET | ORAL | Status: AC
  Administered 2022-10-02: 81 mg via ORAL

## 2022-10-02 MED ORDER — SODIUM CHLORIDE 0.9 % WEIGHT BASED INFUSION: 3.0000 mL/kg/h | INTRAVENOUS | Status: DC

## 2022-10-02 MED ORDER — ASPIRIN 81 MG PO CHEW
162.0000 mg | CHEWABLE_TABLET | Freq: Once | ORAL | Status: AC
  Administered 2022-10-02: 162 mg via ORAL
  Filled 2022-10-02: qty 2

## 2022-10-02 MED ORDER — HEPARIN (PORCINE) IN NACL 1000-0.9 UT/500ML-% IV SOLN
INTRAVENOUS | Status: AC
  Filled 2022-10-02: qty 1000

## 2022-10-02 SURGICAL SUPPLY — 11 items
CATH INFINITI 5 FR JL3.5 (CATHETERS) IMPLANT
CATH INFINITI JR4 5F (CATHETERS) IMPLANT
DEVICE RAD TR BAND REGULAR (VASCULAR PRODUCTS) IMPLANT
DRAPE BRACHIAL (DRAPES) IMPLANT
GLIDESHEATH SLEND SS 6F .021 (SHEATH) IMPLANT
GUIDEWIRE INQWIRE 1.5J.035X260 (WIRE) IMPLANT
INQWIRE 1.5J .035X260CM (WIRE) ×1
PACK CARDIAC CATH (CUSTOM PROCEDURE TRAY) ×1 IMPLANT
PROTECTION STATION PRESSURIZED (MISCELLANEOUS) ×1
SET ATX SIMPLICITY (MISCELLANEOUS) IMPLANT
STATION PROTECTION PRESSURIZED (MISCELLANEOUS) IMPLANT

## 2022-10-02 NOTE — Assessment & Plan Note (Signed)
Baseline history of chronic atrial fibrillation followed by [facility] clinic with Dr. Saralyn Pilar Patient refused anticoagulation in setting remote history of GI bleeding in the past Rate controlled at present with normal sinus rhythm normal EKG Monitor for now Follow-up formal cardiology recommendations

## 2022-10-02 NOTE — Discharge Summary (Signed)
Physician Discharge Summary      Patient ID: Ruben [name] MRN: [id number] DOB/AGE: 76-Apr-1947 76 y.o.  Admit date: 10/02/2022 Discharge date: 10/02/2022  Primary Discharge Diagnosis non-STEMI Secondary Discharge Diagnosis multivessel coronary artery disease  Significant Diagnostic Studies: yes  Consults: cardiology  Hospital Course: Patient was admitted with a non-STEMI because of palpitation chest pain eventually ruled in by troponins up to 6500 EKG was nondiagnostic patient eventually underwent cardiac cath which showed left main disease of 75% ostially 100% LAD ostially and 90% proximal RCA circumflex was small with mild irregularities patient had preserved left ventricular function of at least 55%.  Discussions were made about coronary bypass surgery which appeared to be the best option and patient chose to go to [facility].  Arrangements were made and Dr. Etter Sjogren had graciously accepted the patient in transfer.  Patient is being transferred as an inpatient for preparation for coronary bypass surgery.   Discharge Exam: Blood pressure (!) 110/58, pulse 63, temperature 98.1 F (36.7 C), temperature source Oral, resp. rate 16, height 5\' 8"  (1.727 m), weight 56.2 kg, SpO2 97 %.   General appearance: appears stated age Resp: clear to auscultation bilaterally Cardio: regular rate and rhythm, S1, S2 normal, no murmur, click, rub or gallop GI: soft, non-tender; bowel sounds normal; no masses,  no organomegaly Extremities: extremities normal, atraumatic, no cyanosis or edema Pulses: 2+ and symmetric Neurologic: Alert and oriented X 3, normal strength and tone. Normal symmetric reflexes. Normal coordination and gait Labs:   Lab Results  Component Value Date   WBC 4.7 10/02/2022   HGB 13.0 10/02/2022   HCT 38.6 (L) 10/02/2022   MCV 104.3 (H) 10/02/2022   PLT 177 10/02/2022    Recent Labs  Lab 10/02/22 0234  NA 140  K 3.6  CL 104  CO2 25  BUN 13  CREATININE 0.96   CALCIUM 8.7*  GLUCOSE 104*      Radiology: Chest x-ray was clear  EKG: EKG normal sinus rhythm rate of 70 nonspecific ST-T wave changes  FOLLOW UP PLANS AND APPOINTMENTS Discharge Instructions     AMB referral to Phase II Cardiac Rehabilitation   Complete by: As directed    Diagnosis: NSTEMI   After initial evaluation and assessments completed: Virtual Based Care may be provided alone or in conjunction with Phase 2 Cardiac Rehab based on patient barriers.: Yes   Intensive Cardiac Rehabilitation (ICR) [place] location only OR Traditional Cardiac Rehabilitation (TCR) *If criteria for ICR are not met will enroll in TCR [facility] only): Yes      Allergies as of 10/02/2022       Reactions   Codeine Nausea Only        Medication List     STOP taking these medications    atorvastatin 20 MG tablet Commonly known as: LIPITOR       TAKE these medications    aspirin EC 81 MG tablet Take 1 tablet (81 mg total) by mouth daily. Swallow whole.   cholecalciferol 25 MCG (1000 UNIT) tablet Commonly known as: VITAMIN D3 Take 1,000 Units by mouth daily.   cyanocobalamin 1000 MCG tablet Commonly known as: VITAMIN B12 Take 1,000 mcg by mouth daily.   heparin 25000 UT/250ML infusion Inject 1,100 Units/hr into the vein continuous.   rosuvastatin 20 MG tablet Commonly known as: CRESTOR Take 2 tablets (40 mg total) by mouth at bedtime. What changed:  medication strength how much to take when to take this   vitamin C 250  MG tablet Commonly known as: ASCORBIC ACID Take 250 mg by mouth daily.          BRING ALL MEDICATIONS WITH YOU TO FOLLOW UP APPOINTMENTS  Time spent with patient to include physician time: 35 minutes Signed:  Yolonda Kida MD 10/02/2022, 4:56 PM

## 2022-10-02 NOTE — H&P (Signed)
History and Physical    Patient: Ruben [name] [id number] DOB: 08-Oct-1945 DOA: 10/02/2022 DOS: the patient was seen and examined on 10/02/2022 PCP: [name], MD  Patient coming from: Home  Chief Complaint:  Chief Complaint  Patient presents with   Palpitations   HPI: ENGLISH COIT is a 77 y.o. male with medical history significant of TIA, atrial fibrillation, peptic ulcer disease, hyperlipidemia ending with NSTEMI.  Patient reports developing recurrent palpitations as well as chest pressure around midnight tonight.  Symptoms lasted for approximately 90 minutes.  No associated nausea or diaphoresis.  Chest pressure did not radiate.  No fevers or chills.  Baseline history of atrial fibrillation followed by nodal clinic.  On a baby aspirin.  Patient has denied anticoagulation in the past out of concern for GI bleeding.  Baseline TIA with residual deficits.  No hemiparesis or confusion. Presented to the ER afebrile, hemodynamically stable.  White count 4.7, hemoglobin 13.  Creatinine 0.96.  EKG with some mild ST depressions diffusely.  Troponin 1 87-13 46.  Started on a heparin drip in the ER. Review of Systems: As mentioned in the history of present illness. All other systems reviewed and are negative. Past Medical History:  Diagnosis Date   TIA (transient ischemic attack)    Past Surgical History:  Procedure Laterality Date   SHOULDER ARTHROSCOPY     Social History:  reports that he has never smoked. He has never used smokeless tobacco. He reports that he does not currently use alcohol. He reports that he does not use drugs.  Allergies  Allergen Reactions   Codeine Nausea Only    Family History  Problem Relation Age of Onset   Colon cancer Mother    Heart disease Father     Prior to Admission medications   Medication Sig Start Date End Date Taking? Authorizing Provider  aspirin EC 81 MG EC tablet Take 1 tablet (81 mg total) by mouth daily. Swallow whole. 10/30/20   Yes Sharen Hones, MD  rosuvastatin (CRESTOR) 5 MG tablet Take 5 mg by mouth daily. 05/04/22  Yes [provider]  atorvastatin (LIPITOR) 20 MG tablet Take 4 tablets (80 mg total) by mouth daily. 10/30/20   Sharen Hones, MD  cholecalciferol (VITAMIN D3) 25 MCG (1000 UNIT) tablet Take 1,000 Units by mouth daily.    [provider]  vitamin B-12 (CYANOCOBALAMIN) 1000 MCG tablet Take 1,000 mcg by mouth daily.    [provider]  vitamin C (ASCORBIC ACID) 250 MG tablet Take 250 mg by mouth daily.    [provider]    Physical Exam: Vitals:   10/02/22 0333 10/02/22 0430 10/02/22 0654 10/02/22 0759  BP: 131/72 129/79 (!) 134/57 124/71  Pulse: 78 78 (!) 55 77  Resp: 13 15 18 16   Temp:   97.6 F (36.4 C) 98 F (36.7 C)  TempSrc:   Oral Oral  SpO2: 100% 100% 98% 100%  Weight:      Height:       Physical Exam Constitutional:      Appearance: He is normal weight.  HENT:     Head: Normocephalic and atraumatic.     Nose: Nose normal.     Mouth/Throat:     Mouth: Mucous membranes are moist.  Eyes:     Pupils: Pupils are equal, round, and reactive to light.  Cardiovascular:     Rate and Rhythm: Normal rate and regular rhythm.  Pulmonary:     Effort: Pulmonary effort is  normal.  Abdominal:     General: Bowel sounds are normal.  Musculoskeletal:        General: Normal range of motion.  Skin:    General: Skin is warm.  Neurological:     General: No focal deficit present.  Psychiatric:        Mood and Affect: Mood normal.     Data Reviewed:  There are no new results to review at this time. DG Chest Port 1 View CLINICAL DATA:  Palpitations  EXAM: PORTABLE CHEST 1 VIEW  COMPARISON:  None Available.  FINDINGS: The heart size and mediastinal contours are within normal limits. Both lungs are clear. The visualized skeletal structures are unremarkable.  IMPRESSION: No active disease.  Electronically Signed   By: Placido Sou M.D.   On:  10/02/2022 02:46   Lab Results  Component Value Date   WBC 4.7 10/02/2022   HGB 13.0 10/02/2022   HCT 38.6 (L) 10/02/2022   MCV 104.3 (H) 10/02/2022   PLT 177 [id number]   Last metabolic panel Lab Results  Component Value Date   GLUCOSE 104 (H) 10/02/2022   NA 140 10/02/2022   K 3.6 10/02/2022   CL 104 10/02/2022   CO2 25 10/02/2022   BUN 13 10/02/2022   CREATININE 0.96 10/02/2022   GFRNONAA >60 10/02/2022   CALCIUM 8.7 (L) 10/02/2022   PROT 6.3 (L) 10/28/2020   ALBUMIN 4.0 10/28/2020   BILITOT 1.2 10/28/2020   ALKPHOS 23 (L) 10/28/2020   AST 30 10/28/2020   ALT 27 10/28/2020   ANIONGAP 11 10/02/2022     Assessment and Plan: * NSTEMI (non-ST elevated myocardial infarction) (HCC) Recurring palpitations and chest pressure since around midnight-symptoms lasted for at least 90+ minutes Patient given full dose aspirin on initial evaluation EKG with mild ST depressions diffusely Troponin 187-->1346 with patient being started on a heparin drip in the ER Heart score tentatively 8+ Case discussed with on-call cardiologist Dr. Clayborn Bigness who was evaluating the patient at the bedside Follow-up cardiology recommendations Follow      Atrial fibrillation, chronic (HCC) Baseline history of chronic atrial fibrillation followed by [facility] clinic with Dr. Saralyn Pilar Patient refused anticoagulation in setting remote history of GI bleeding in the past Rate controlled at present with normal sinus rhythm normal EKG Monitor for now Follow-up formal cardiology recommendations  Hyperlipidemia Lipid panel pending Start high-dose statin Follow   PUD (peptic ulcer disease) Remote history of GI bleeding in the past Start PPI in the setting of anticoagulation Follow  TIA (transient ischemic attack) Remote history of TIA in the past with concomitant atrial fibrillation not on anticoagulation-on baby asa(patient refused Eliquis given remote history of GI bleeding per report) Minimal  residual deficits Follow        Advance Care Planning:   Code Status: Full Code   Consults: [facility] cardiology   Family Communication: No family at the bedside   Severity of Illness: The appropriate patient status for this patient is INPATIENT. Inpatient status is judged to be reasonable and necessary in order to provide the required intensity of service to ensure the patient's safety. The patient's presenting symptoms, physical exam findings, and initial radiographic and laboratory data in the context of their chronic comorbidities is felt to place them at high risk for further clinical deterioration. Furthermore, it is not anticipated that the patient will be medically stable for discharge from the hospital within 2 midnights of admission.   * I certify that at the point of admission it  is my clinical judgment that the patient will require inpatient hospital care spanning beyond 2 midnights from the point of admission due to high intensity of service, high risk for further deterioration and high frequency of surveillance required.*  Author: Deneise Lever, MD 10/02/2022 9:00 AM  For on call review [email or web address].

## 2022-10-02 NOTE — Progress Notes (Signed)
ANTICOAGULATION CONSULT NOTE - Initial Consult  Pharmacy Consult for Heparin  Indication: chest pain/ACS  Allergies  Allergen Reactions   Codeine Nausea Only    Patient Measurements: Height: 5\' 8"  (172.7 cm) Weight: 56.2 kg (123 lb 14.4 oz) (scale B) IBW/kg (Calculated) : 68.4 Heparin Dosing Weight: 83.9 kg   Vital Signs: Temp: 98.6 F (37 C) (03/15 1230) Temp Source: Oral (03/15 1230) BP: 113/57 (03/15 1230) Pulse Rate: 61 (03/15 1230)  Labs: Recent Labs    10/02/22 0234 10/02/22 0429 10/02/22 1043 10/02/22 1215  HGB 13.0  --   --   --   HCT 38.6*  --   --   --   PLT 177  --   --   --   APTT 34  --   --   --   LABPROT 13.1  --   --   --   INR 1.0  --   --   --   HEPARINUNFRC  --   --  0.66  --   CREATININE 0.96  --   --   --   TROPONINIHS 187* 1,346* 6,534* 6,270*     Estimated Creatinine Clearance: 52.9 mL/min (by C-G formula based on SCr of 0.96 mg/dL).   Medical History: Past Medical History:  Diagnosis Date   TIA (transient ischemic attack)     Medications:  Medications Prior to Admission  Medication Sig Dispense Refill Last Dose   aspirin EC 81 MG EC tablet Take 1 tablet (81 mg total) by mouth daily. Swallow whole. 30 tablet 0 10/02/2022   cholecalciferol (VITAMIN D3) 25 MCG (1000 UNIT) tablet Take 1,000 Units by mouth daily.   10/01/2022   rosuvastatin (CRESTOR) 5 MG tablet Take 5 mg by mouth daily.   10/01/2022   vitamin B-12 (CYANOCOBALAMIN) 1000 MCG tablet Take 1,000 mcg by mouth daily.   10/01/2022   vitamin C (ASCORBIC ACID) 250 MG tablet Take 250 mg by mouth daily.   10/01/2022   atorvastatin (LIPITOR) 20 MG tablet Take 4 tablets (80 mg total) by mouth daily. (Patient not taking: Reported on 10/02/2022) 30 tablet 0 Not Taking    Assessment: Pharmacy consulted to dose heparin in this 77 year old male admitted with ACS/NSTEMI.  No prior anticoag noted. CrCl = 70.2 ml/min   Goal of Therapy:  Heparin level 0.3-0.7 units/ml Monitor platelets by  anticoagulation protocol: Yes   Plan:  3/15@1043 : HL 0.66, therapeutic x 1 Continue heparin infusion at 1100 units/hr Check confirmatory anti-Xa level in 8 hours Continue to monitor H&H and platelets  Deborra Phegley A Tinsley Everman 10/02/2022,2:41 PM

## 2022-10-02 NOTE — Progress Notes (Signed)
*  PRELIMINARY RESULTS* Echocardiogram 2D Echocardiogram has been performed.  Ruben [name] 10/02/2022, 11:02 AM

## 2022-10-02 NOTE — Assessment & Plan Note (Addendum)
Recurring palpitations and chest pressure since around midnight-symptoms lasted for at least 90+ minutes Patient given full dose aspirin on initial evaluation EKG with mild ST depressions diffusely Troponin 187-->1346 with patient being started on a heparin drip in the ER Heart score tentatively 8+ Case discussed with on-call cardiologist Dr. Clayborn Bigness who was evaluating the patient at the bedside Follow-up cardiology recommendations Follow

## 2022-10-02 NOTE — ED Provider Notes (Addendum)
[facility] Provider Note    Event Date/Time   First MD Initiated Contact with Patient 10/02/22 0229     (approximate)   History   Palpitations   HPI  Ruben [name] is a 77 y.o. male past medical history of TIA, paroxysmal A-fib glossal cancer, peptic ulcer disease, hyperlipidemia who presents because of palpitations.  Patient was laying down to go to bed around midnight when he developed palpitations.  Describes as an irregular heartbeat and was quite uncomfortable for him he was not able to sleep.  He endorses some vague chest discomfort but really no pain no dyspnea lightheadedness or syncope.  Lasted for an hour and a half and then abruptly resolved.  Patient tells me he does have history of A-fib which was seen on a cardiac monitor he wore an outpatient after he had a stroke but he decided he did not want to be on anticoagulation.  Otherwise was feeling well prior to this event.  Feels back to baseline currently.     Past Medical History:  Diagnosis Date   TIA (transient ischemic attack)     Patient Active Problem List   Diagnosis Date Noted   TIA (transient ischemic attack) 10/28/2020   Gastritis 10/28/2020   PUD (peptic ulcer disease) 10/28/2020   Tongue mass 04/15/2017     Physical Exam  Triage Vital Signs: ED Triage Vitals  Enc Vitals Group     BP 10/02/22 0224 126/79     Pulse Rate 10/02/22 0224 87     Resp 10/02/22 0224 18     Temp 10/02/22 0224 97.9 F (36.6 C)     Temp Source 10/02/22 0224 Oral     SpO2 10/02/22 0224 100 %     Weight 10/02/22 0222 185 lb (83.9 kg)     Height 10/02/22 0222 5\' 8"  (1.727 m)     Head Circumference --      Peak Flow --      Pain Score 10/02/22 0222 0     Pain Loc --      Pain Edu? --      Excl. in [place]? --     Most recent vital signs: Vitals:   10/02/22 0333 10/02/22 0430  BP: 131/72 129/79  Pulse: 78 78  Resp: 13 15  Temp:    SpO2: 100% 100%     General: Awake, no distress.   CV:  Good peripheral perfusion.  No peripheral edema resp:  Normal effort.  Lungs are clear Abd:  No distention.  Neuro:             Awake, Alert, Oriented x 3  Other:     ED Results / Procedures / Treatments  Labs (all labs ordered are listed, but only abnormal results are displayed) Labs Reviewed  BASIC METABOLIC PANEL - Abnormal; Notable for the following components:      Result Value   Glucose, Bld 104 (*)    Calcium 8.7 (*)    All other components within normal limits  CBC - Abnormal; Notable for the following components:   RBC 3.70 (*)    HCT 38.6 (*)    MCV 104.3 (*)    MCH 35.1 (*)    All other components within normal limits  TROPONIN I (HIGH SENSITIVITY) - Abnormal; Notable for the following components:   Troponin I (High Sensitivity) 187 (*)    All other components within normal limits  TROPONIN I (HIGH SENSITIVITY) - Abnormal; Notable for  the following components:   Troponin I (High Sensitivity) 1,346 (*)    All other components within normal limits     EKG  EKG reviewed interpreted myself shows ST depression 2, 3, aVF, V3 V4 V5 V6 with biphasic appearance of T waves in V4  RADIOLOGY I reviewed and interpreted the CXR which does not show any acute cardiopulmonary process    PROCEDURES:  Critical Care performed: No  .1-3 Lead EKG Interpretation  Performed by: Rada Hay, MD Authorized by: Rada Hay, MD     Interpretation: normal     ECG rate assessment: normal     Rhythm: sinus rhythm     Ectopy: none     Conduction: normal     The patient is on the cardiac monitor to evaluate for evidence of arrhythmia and/or significant heart rate changes.   MEDICATIONS ORDERED IN ED: Medications  aspirin chewable tablet 162 mg (162 mg Oral Given 10/02/22 0336)     IMPRESSION / MDM / ASSESSMENT AND PLAN / ED COURSE  I reviewed the triage vital signs and the nursing notes.                              Patient's presentation is most  consistent with acute presentation with potential threat to life or bodily function.  Differential diagnosis includes, but is not limited to, atrial fibrillation, SVT, V. tach, PVCs, ACS  The patient is 77 year old male with history of paroxysmal A-fib not anticoagulated presents after an episode of palpitations.  This started around midnight lasted for an hour and a half.  He felt sensation of irregular heartbeat also somewhat fast heartbeat and this resolved after an hour and a half.  He endorses abrupt onset and abrupt end to the symptoms.  He endorses some discomfort related to the palpitations but denies any chest pain.  Currently he feels back to baseline has no complaints.  Patient's EKG does have abnormalities he has ST depressions inferiorly as well as V3 through V6 and abnormal appearance of T wave and ST segment in V4 these do look new today.  Patient's troponin is elevated at 180.  This could be demand in the setting of his likely arrhythmia which I suspect may have been A-fib.  But with his abnormal EKG and elevated troponin I do feel that he will need to be admitted to have the troponins trended.  Will defer heparin at this time.  He took 2 baby aspirin prior to coming will give additional 2 so he gets a full 324.  Discussed recommendations for admission with the patient.  He was very hesitant about admission as he is concerned he will be able to sleep.  He would like to see what the second troponin is.  I did notify him that he will be leaving [place] if he does decide to leave.  I do think that knowing whether the troponin is uptrending will give [place] a better idea of his trajectory.  I did repeat his EKG and the significant ST depressions have significantly improved as has V4.  Repeat troponin is 1300.  Patient is now willing to stay.  Will start on heparin.      FINAL CLINICAL IMPRESSION(S) / ED DIAGNOSES   Final diagnoses:  Palpitations     Rx / DC Orders   ED  Discharge Orders     None        Note:  This document was prepared using Dragon voice recognition software and may include unintentional dictation errors.   Rada Hay, MD 10/02/22 [id number]    Rada Hay, MD 10/02/22 [phone]

## 2022-10-02 NOTE — Assessment & Plan Note (Signed)
Remote history of GI bleeding in the past Start PPI in the setting of anticoagulation Follow

## 2022-10-02 NOTE — TOC Benefit Eligibility Note (Signed)
Patient Teacher, English as a foreign language completed.    The patient is currently admitted and upon discharge could be taking Xarelto.  The current 30 day co-pay is $47.00.   The patient is currently admitted and upon discharge could be taking Eliquis.  The current 30 day co-pay is $47.00.   The patient is insured through [employer]

## 2022-10-02 NOTE — Assessment & Plan Note (Signed)
Lipid panel pending Start high-dose statin Follow

## 2022-10-02 NOTE — Progress Notes (Signed)
Brief cardiology consult note  Asked to see patient with palpitation and paroxysmal atrial fibrillation and elevated troponins over 1300 Patient reported recurrent episodes of palpitations tachycardia so came to the emergency room because of prolonged episode over an hour and a half he also describes some chest discomfort and upper left chest and back  EKG is nonspecific rate of about 75 not significantly different from previous EKGs  Laboratories unremarkable except for elevation in troponin from around 182/1300 follow-up troponins are still pending  Patient is currently pain-free palpitation free  Cussed the risk and benefits of cardiac catheterization to rule out non-STEMI versus demand ischemia  Patient states at this stage he is not willing to have a catheterization done  It is unclear to me what other testing modality that we can perform as an inpatient would help guide [place] in his management and care  We can consider a functional [place] for assessment of ischemia but even in that I am not sure that I be comfortable relying on those results if they are negative in a situation like this  After explaining and reviewing his results and the possible diagnosis and underlying issues including non-STEMI versus demand ischemia where in this situation cardiac cath will be the most definitive test but because the patient is unwilling to proceed with recommended medical management we may be limited in overall assessment  Case discussed with attending physician Dr. Ernestina Patches as well as the patient  Full note to follow [facility]

## 2022-10-02 NOTE — Telephone Encounter (Signed)
Pharmacy Patient Advocate Encounter  Insurance verification completed.    The patient is insured through [employer]   The patient is currently admitted and ran test claims for the following: Eliquis, Xarelto.  Copays and coinsurance results were relayed to Inpatient clinical team.

## 2022-10-02 NOTE — Consult Note (Signed)
[facility] NOTE       Patient ID: Ruben [name] MRN: [id number] DOB/AGE: 76/06/1946 76 y.o.  Admit date: 10/02/2022 Referring Physician Dr. Shanda Howells Primary Physician Dr. Baldemar Lenis Primary Cardiologist Dr. Saralyn Pilar Reason for Consultation NSTEMI  HPI: Ruben [name]. Blandin is a 89yoM with a PMH of paroxysmal AF (declines AC), hx TIA, hyperlipidemia, hx GI bleed who presented to [facility] ED 10/02/22 with palpitations and chest pressure that radiated to his back that the patient initially attributed to "PVCs."  Troponins are elevated and uptrending from 187 to a current peak of 6534 on the third repeat over 8 hours.  EKG with new inferior ST depressions and T wave inversions V1 through V3 consistent with NSTEMI for which cardiology is consulted.  The patient presents with his son.  The patient states that he has a history of PVCs and has had short instances of palpitations for many years now.  He describes the sensation as "feeling as if his heart is turning over in his chest" and typically only last for a few seconds at a time before terminating with deep breaths or without intervention at all.  Patient is followed by Dr. Saralyn Pilar for his paroxysmal atrial fibrillation that was discovered on 30-day heart monitor in September 2022 following a TIA.  Patient has adamantly declined therapeutic anticoagulation due to his history of GI bleeding in setting of Aleve use but also because he does not feel that he has "sufficient evidence" documenting his atrial fibrillation to warrant further treatment.  He states about 4-5 weeks ago he had a "funny crazy rhythm" he felt in his chest similar to what he experiences when he has PVCs which lasted about 30 minutes before terminating on its own.  This episode occurred while he was getting ready to go to bed.  Yesterday evening the patient experienced a similar sensation where he felt as though his heart was turning over in his chest but also  experienced generalized upper chest pressure which radiated to his back.  This episode lasted about an hour and a half which had never happened before and he felt concerned enough to present to the emergency department.  He denies any associated shortness of breath, diaphoresis, nausea, or lightheadedness with the chest and back discomfort and he says at most severity it was about a 1/10.  The discomfort eased off by the time he was in the emergency department.  The emergency medicine physician discussed in detail his new EKG abnormalities as well as his abnormal troponin concerning for a heart attack.  He initially was very hesitant to be admitted to the hospital but was eventually agreeable to stay.  He was given aspirin and started on a heparin infusion and was kept n.p.o. overnight.  In my time of evaluation the patient is sitting upright in bed with the son at bedside.  He is currently chest pressure, back pain, and palpitation free.  Normotensive and comfortable on room air.  In sinus rhythm on telemetry with rate in the 60s.  Labs notable for potassium 3.6, BUN/creatinine 13/0.96 and GFR >60.  Troponins elevated and with significant delta, trending 187, 1346, 6534 with repeats pending.  Cholesterol panel with TC 245, HDL 34, LDL 144, triglycerides 335, VLDL 67.  H&H 13/38.6 with elevated MCV at 104.3.  Hemoglobin A1c pending.  Chest x-ray without active cardiopulmonary disease   Review of systems complete and found to be negative unless listed above     Past Medical History:  Diagnosis Date   TIA (transient ischemic attack)     Past Surgical History:  Procedure Laterality Date   SHOULDER ARTHROSCOPY      Medications Prior to Admission  Medication Sig Dispense Refill Last Dose   aspirin EC 81 MG EC tablet Take 1 tablet (81 mg total) by mouth daily. Swallow whole. 30 tablet 0 10/02/2022   rosuvastatin (CRESTOR) 5 MG tablet Take 5 mg by mouth daily.      atorvastatin (LIPITOR) 20 MG tablet  Take 4 tablets (80 mg total) by mouth daily. 30 tablet 0    cholecalciferol (VITAMIN D3) 25 MCG (1000 UNIT) tablet Take 1,000 Units by mouth daily.      vitamin B-12 (CYANOCOBALAMIN) 1000 MCG tablet Take 1,000 mcg by mouth daily.      vitamin C (ASCORBIC ACID) 250 MG tablet Take 250 mg by mouth daily.      Social History   Socioeconomic History   Marital status: Married    Spouse name: Not on file   Number of children: Not on file   Years of education: Not on file   Highest education level: Not on file  Occupational History   Not on file  Tobacco Use   Smoking status: Never   Smokeless tobacco: Never  Substance and Sexual Activity   Alcohol use: Not Currently   Drug use: Never   Sexual activity: Yes  Other Topics Concern   Not on file  Social History Narrative   Not on file   Social Determinants of Health   Financial Resource Strain: Not on file  Food Insecurity: Not on file  Transportation Needs: Not on file  Physical Activity: Not on file  Stress: Not on file  Social Connections: Not on file  Intimate Partner Violence: Not on file    Family History  Problem Relation Age of Onset   Colon cancer Mother    Heart disease Father       Intake/Output Summary (Last 24 hours) at 10/02/2022 1213 Last data filed at 10/02/2022 0900 Gross per 24 hour  Intake 60.41 ml  Output --  Net 60.41 ml    Vitals:   10/02/22 0430 10/02/22 0654 10/02/22 0759 10/02/22 0939  BP: 129/79 (!) 134/57 124/71 118/74  Pulse: 78 (!) 55 77 (!) 57  Resp: 15 18 16 20   Temp:  97.6 F (36.4 C) 98 F (36.7 C) (!) 97.5 F (36.4 C)  TempSrc:  Oral Oral Oral  SpO2: 100% 98% 100% 100%  Weight:      Height:        PHYSICAL EXAM General: elderly caucasian male , well nourished, in no acute distress. Sitting upright in hospital bed, son at bedside HEENT:  Normocephalic and atraumatic. Neck:  No JVD.  Lungs: Normal respiratory effort on room air. Clear bilaterally to auscultation. No wheezes,  crackles, rhonchi.  Heart: HRRR . Normal S1 and S2 without gallops or murmurs.  Abdomen: Non-distended appearing.  Msk: Normal strength and tone for age. Extremities: Warm and well perfused. No clubbing, cyanosis. No peripheral edema.  Neuro: Alert and oriented X 3. Psych:  Answers questions appropriately.   Labs: Basic Metabolic Panel: Recent Labs    10/02/22 0234  NA 140  K 3.6  CL 104  CO2 25  GLUCOSE 104*  BUN 13  CREATININE 0.96  CALCIUM 8.7*   Liver Function Tests: No results for input(s): "AST", "ALT", "ALKPHOS", "BILITOT", "PROT", "ALBUMIN" in the last 72 hours. No results for input(s): "LIPASE", "AMYLASE"  in the last 72 hours. CBC: Recent Labs    10/02/22 0234  WBC 4.7  HGB 13.0  HCT 38.6*  MCV 104.3*  PLT 177   Cardiac Enzymes: Recent Labs    10/02/22 0234 10/02/22 0429 10/02/22 1043  TROPONINIHS 187* 1,346* 6,534*   BNP: No results for input(s): "BNP" in the last 72 hours. D-Dimer: No results for input(s): "DDIMER" in the last 72 hours. Hemoglobin A1C: No results for input(s): "HGBA1C" in the last 72 hours. Fasting Lipid Panel: No results for input(s): "CHOL", "HDL", "LDLCALC", "TRIG", "CHOLHDL", "LDLDIRECT" in the last 72 hours. Thyroid Function Tests: No results for input(s): "TSH", "T4TOTAL", "T3FREE", "THYROIDAB" in the last 72 hours.  Invalid input(s): "FREET3" Anemia Panel: No results for input(s): "VITAMINB12", "FOLATE", "FERRITIN", "TIBC", "IRON", "RETICCTPCT" in the last 72 hours.   Radiology: [facility] Chest Port 1 View  Result Date: 10/02/2022 CLINICAL DATA:  Palpitations EXAM: PORTABLE CHEST 1 VIEW COMPARISON:  None Available. FINDINGS: The heart size and mediastinal contours are within normal limits. Both lungs are clear. The visualized skeletal structures are unremarkable. IMPRESSION: No active disease. Electronically Signed   By: Placido Sou M.D.   On: 10/02/2022 02:46    ECHO 10/29/2020  1. Left ventricular ejection fraction, by  estimation, is >55%. The left  ventricle has normal function. Left ventricular endocardial border not  optimally defined to evaluate regional wall motion. Left ventricular  diastolic parameters were normal.   2. Right ventricular systolic function is normal. The right ventricular  size is normal. Tricuspid regurgitation signal is inadequate for assessing  PA pressure.   3. The mitral valve was not well visualized. Trivial mitral valve  regurgitation. No evidence of mitral stenosis.   4. The aortic valve was not well visualized. Aortic valve regurgitation  is not visualized. No aortic stenosis is present.   5. The inferior vena cava is normal in size with greater than 50%  respiratory variability, suggesting right atrial pressure of 3 mmHg.   TELEMETRY reviewed by me (LT) 10/02/2022 : Sinus rhythm rate 60s  EKG reviewed by me: new inferior ST depressions and T wave inversions V1 through V3   Data reviewed by me (LT) 10/02/2022: Last outpatient cardiology note, ED note  Principal Problem:   NSTEMI (non-ST elevated myocardial infarction) ([place]) Active Problems:   TIA (transient ischemic attack)   PUD (peptic ulcer disease)   Tachyarrhythmia   Hyperlipidemia   Atrial fibrillation, chronic ([place])    ASSESSMENT AND PLAN:  Blue Isenhart. Un is a 3yoM with a PMH of paroxysmal AF (declines AC), hx TIA, hyperlipidemia, hx GI bleed who presented to [facility] ED 10/02/22 with palpitations and chest pressure that radiated to his back that the patient initially attributed to "PVCs."  Troponins are elevated and uptrending from 187 to a current peak of 6534 on the third repeat over 8 hours.  EKG with new inferior ST depressions and T wave inversions V1 through V3 consistent with NSTEMI for which cardiology is consulted.  # NSTEMI Patient presented with palpitations and chest pressure that radiated to his back he attributed initially to PVCs. This episode lasted an hour and a half, which is the longest he  never has in duration (usually his palpitations are transient, lasting only few seconds before stopping on their own), and terminated by the time he was in ER. EKG with new inferior ST depressions and progressive T wave inversions in V1 through V3 concerning for ACS.  Enzymes are also elevated to a current peak  of 6534.  Dr. Clayborn Bigness and I have both evaluated the patient at the bedside and discussed in detail the objective evidence that he is having a heart attack and further recommendation for left heart cath for definitive diagnosis and delineation of his coronary anatomy.  The patient was initially very skeptical with the data presented to him despite my explanation and lengthy time spent at the bedside. We discussed the risks, benefits, and potential outcomes of a left heart cath.  Ultimately, he discussed further with his wife and son is now agreeable to proceed with left heart cath with Dr. Clayborn Bigness this afternoon. -Trend troponin until peak, then stop -S/p 325 mg aspirin, continue 81 mg aspirin daily, indefinitely -Continue heparin drip until LHC -Consider addition of BB if BP and heart rate allow (baseline bradycardia) -Consider ACE/ARB if BP allows -Check A1c -Obtain echo complete -Further recommendations following LHC.  # Paroxysmal atrial fibrillation We discussed in detail his history of atrial fibrillation and rationale behind therapeutic anticoagulation for stroke prevention. He continues to decline despite my recommendation.  # hyperlipidemia Cholesterol remains uncontrolled despite his strict vegan diet.  LDL 144. -Increase rosuvastatin from 5 mg to 20 mg nightly.  This patient's plan of care was discussed and created with Dr. Clayborn Bigness and he is in agreement.  Signed: Tristan Schroeder , PA-C 10/02/2022, 12:13 PM [facility] Cardiology

## 2022-10-02 NOTE — Progress Notes (Signed)
ANTICOAGULATION CONSULT NOTE - Initial Consult  Pharmacy Consult for Heparin  Indication: chest pain/ACS  Allergies  Allergen Reactions   Codeine Nausea Only    Patient Measurements: Height: 5\' 8"  (172.7 cm) Weight: 83.9 kg (185 lb) IBW/kg (Calculated) : 68.4 Heparin Dosing Weight: 83.9 kg   Vital Signs: Temp: 97.9 F (36.6 C) (03/15 0224) Temp Source: Oral (03/15 0224) BP: 129/79 (03/15 0430) Pulse Rate: 78 (03/15 0430)  Labs: Recent Labs    10/02/22 0234 10/02/22 0429  HGB 13.0  --   HCT 38.6*  --   PLT 177  --   CREATININE 0.96  --   TROPONINIHS 187* 1,346*    Estimated Creatinine Clearance: 70.2 mL/min (by C-G formula based on SCr of 0.96 mg/dL).   Medical History: Past Medical History:  Diagnosis Date   TIA (transient ischemic attack)     Medications:  (Not in a hospital admission)   Assessment: Pharmacy consulted to dose heparin in this 77 year old male admitted with ACS/NSTEMI.  No prior anticoag noted. CrCl = 70.2 ml/min   Goal of Therapy:  Heparin level 0.3-0.7 units/ml Monitor platelets by anticoagulation protocol: Yes   Plan:  Give 4000 units bolus x 1 Start heparin infusion at 1100 units/hr Check anti-Xa level in 8 hours and daily while on heparin Continue to monitor H&H and platelets  Ruben [name] D 10/02/2022,5:30 AM

## 2022-10-02 NOTE — Progress Notes (Signed)
Pt eating and family by bedside

## 2022-10-02 NOTE — Assessment & Plan Note (Signed)
Remote history of TIA in the past with concomitant atrial fibrillation not on anticoagulation-on baby asa(patient refused Eliquis given remote history of GI bleeding per report) Minimal residual deficits Follow

## 2022-10-02 NOTE — ED Triage Notes (Signed)
Pt presents ambulatory to triage via POV with complaints of palpitations that started PTA. He notes that the sensation lasted ~1.5 hours without any relief. He notes having periods of an irregular HR and has discomfort with that rate. Notes taking 2 (81mg ) ASA PTA - no significant change. A&Ox4 at this time. Denies SOB.

## 2022-10-03 ENCOUNTER — Inpatient Hospital Stay (HOSPITAL_COMMUNITY)
Admission: RE | Admit: 2022-10-03 | Discharge: 2022-10-11 | DRG: 235 | Disposition: A | Discharge status: Home / Self Care | Payer: PPO | Attending: Surgery | Admitting: Surgery

## 2022-10-03 ENCOUNTER — Inpatient Hospital Stay (HOSPITAL_COMMUNITY): Payer: PPO

## 2022-10-03 ENCOUNTER — Observation Stay (HOSPITAL_COMMUNITY): Payer: PPO

## 2022-10-03 DIAGNOSIS — I25118 Atherosclerotic heart disease of native coronary artery with other forms of angina pectoris: Secondary | ICD-10-CM | POA: Diagnosis not present

## 2022-10-03 DIAGNOSIS — I088 Other rheumatic multiple valve diseases: Secondary | ICD-10-CM | POA: Diagnosis not present

## 2022-10-03 DIAGNOSIS — Z7982 Long term (current) use of aspirin: Secondary | ICD-10-CM

## 2022-10-03 DIAGNOSIS — Z800 Family history of malignant neoplasm of digestive organs: Secondary | ICD-10-CM | POA: Diagnosis not present

## 2022-10-03 DIAGNOSIS — I6523 Occlusion and stenosis of bilateral carotid arteries: Secondary | ICD-10-CM | POA: Diagnosis not present

## 2022-10-03 DIAGNOSIS — Z951 Presence of aortocoronary bypass graft: Secondary | ICD-10-CM

## 2022-10-03 DIAGNOSIS — Z885 Allergy status to narcotic agent status: Secondary | ICD-10-CM | POA: Diagnosis not present

## 2022-10-03 DIAGNOSIS — Z8673 Personal history of transient ischemic attack (TIA), and cerebral infarction without residual deficits: Secondary | ICD-10-CM | POA: Diagnosis not present

## 2022-10-03 DIAGNOSIS — I252 Old myocardial infarction: Secondary | ICD-10-CM | POA: Diagnosis not present

## 2022-10-03 DIAGNOSIS — D696 Thrombocytopenia, unspecified: Secondary | ICD-10-CM | POA: Diagnosis not present

## 2022-10-03 DIAGNOSIS — Z01810 Encounter for preprocedural cardiovascular examination: Secondary | ICD-10-CM | POA: Diagnosis not present

## 2022-10-03 DIAGNOSIS — I214 Non-ST elevation (NSTEMI) myocardial infarction: Principal | ICD-10-CM | POA: Diagnosis present

## 2022-10-03 DIAGNOSIS — K279 Peptic ulcer, site unspecified, unspecified as acute or chronic, without hemorrhage or perforation: Secondary | ICD-10-CM | POA: Diagnosis not present

## 2022-10-03 DIAGNOSIS — I2510 Atherosclerotic heart disease of native coronary artery without angina pectoris: Secondary | ICD-10-CM | POA: Diagnosis not present

## 2022-10-03 DIAGNOSIS — Z8711 Personal history of peptic ulcer disease: Secondary | ICD-10-CM

## 2022-10-03 DIAGNOSIS — G459 Transient cerebral ischemic attack, unspecified: Secondary | ICD-10-CM

## 2022-10-03 DIAGNOSIS — Z1152 Encounter for screening for COVID-19: Secondary | ICD-10-CM

## 2022-10-03 DIAGNOSIS — R918 Other nonspecific abnormal finding of lung field: Secondary | ICD-10-CM | POA: Diagnosis not present

## 2022-10-03 DIAGNOSIS — Z8249 Family history of ischemic heart disease and other diseases of the circulatory system: Secondary | ICD-10-CM | POA: Diagnosis not present

## 2022-10-03 DIAGNOSIS — Z79899 Other long term (current) drug therapy: Secondary | ICD-10-CM | POA: Diagnosis not present

## 2022-10-03 DIAGNOSIS — I700 Atherosclerosis of aorta: Secondary | ICD-10-CM | POA: Diagnosis not present

## 2022-10-03 DIAGNOSIS — I480 Paroxysmal atrial fibrillation: Secondary | ICD-10-CM | POA: Diagnosis not present

## 2022-10-03 DIAGNOSIS — J939 Pneumothorax, unspecified: Secondary | ICD-10-CM | POA: Diagnosis not present

## 2022-10-03 DIAGNOSIS — I25110 Atherosclerotic heart disease of native coronary artery with unstable angina pectoris: Secondary | ICD-10-CM | POA: Diagnosis not present

## 2022-10-03 DIAGNOSIS — Z886 Allergy status to analgesic agent status: Secondary | ICD-10-CM | POA: Diagnosis not present

## 2022-10-03 DIAGNOSIS — D62 Acute posthemorrhagic anemia: Secondary | ICD-10-CM | POA: Diagnosis not present

## 2022-10-03 DIAGNOSIS — Z8719 Personal history of other diseases of the digestive system: Secondary | ICD-10-CM | POA: Diagnosis not present

## 2022-10-03 DIAGNOSIS — E785 Hyperlipidemia, unspecified: Secondary | ICD-10-CM | POA: Diagnosis present

## 2022-10-03 DIAGNOSIS — I482 Chronic atrial fibrillation, unspecified: Secondary | ICD-10-CM | POA: Diagnosis not present

## 2022-10-03 DIAGNOSIS — Z01818 Encounter for other preprocedural examination: Secondary | ICD-10-CM | POA: Diagnosis not present

## 2022-10-03 DIAGNOSIS — J9811 Atelectasis: Secondary | ICD-10-CM | POA: Diagnosis not present

## 2022-10-03 DIAGNOSIS — R000 Tachycardia, unspecified: Secondary | ICD-10-CM | POA: Diagnosis not present

## 2022-10-03 LAB — HEPARIN LEVEL (UNFRACTIONATED): Heparin Unfractionated: 0.57 IU/mL (ref 0.30–0.70)

## 2022-10-03 LAB — COMPREHENSIVE METABOLIC PANEL
ALT: 18 U/L (ref 0–44)
AST: 36 U/L (ref 15–41)
Albumin: 3.1 g/dL — ABNORMAL LOW (ref 3.5–5.0)
Alkaline Phosphatase: 12 U/L — ABNORMAL LOW (ref 38–126)
Anion gap: 9 (ref 5–15)
BUN: 16 mg/dL (ref 8–23)
CO2: 23 mmol/L (ref 22–32)
Calcium: 8.6 mg/dL — ABNORMAL LOW (ref 8.9–10.3)
Chloride: 108 mmol/L (ref 98–111)
Creatinine, Ser: 1.11 mg/dL (ref 0.61–1.24)
GFR, Estimated: >60 mL/min (ref >60)
Glucose, Bld: 82 mg/dL (ref 70–99)
Potassium: 3.7 mmol/L (ref 3.5–5.1)
Sodium: 140 mmol/L (ref 135–145)
Total Bilirubin: 1.8 mg/dL — ABNORMAL HIGH (ref 0.3–1.2)
Total Protein: 4.9 g/dL — ABNORMAL LOW (ref 6.5–8.1)

## 2022-10-03 LAB — HEMOGLOBIN A1C
Hgb A1c MFr Bld: 5.4 % (ref 4.8–5.6)
Mean Plasma Glucose: 108 mg/dL

## 2022-10-03 LAB — CBC
HCT: 31.7 % — ABNORMAL LOW (ref 39.0–52.0)
Hemoglobin: 11.3 g/dL — ABNORMAL LOW (ref 13.0–17.0)
MCH: 36.6 pg — ABNORMAL HIGH (ref 26.0–34.0)
MCHC: 35.6 g/dL (ref 30.0–36.0)
MCV: 102.6 fL — ABNORMAL HIGH (ref 80.0–100.0)
Platelets: 149 10*3/uL — ABNORMAL LOW (ref 150–400)
RBC: 3.09 MIL/uL — ABNORMAL LOW (ref 4.22–5.81)
RDW: 12.2 % (ref 11.5–15.5)
WBC: 4.5 10*3/uL (ref 4.0–10.5)
nRBC: 0 % (ref 0.0–0.2)

## 2022-10-03 LAB — MRSA NEXT GEN BY PCR, NASAL: MRSA by PCR Next Gen: NOT DETECTED

## 2022-10-03 MED ORDER — ROSUVASTATIN CALCIUM 20 MG PO TABS
20.0000 mg | ORAL_TABLET | Freq: Every day | ORAL | Status: DC
  Administered 2022-10-03 – 2022-10-04 (×2): 20 mg via ORAL
  Filled 2022-10-03 (×2): qty 1

## 2022-10-03 MED ORDER — ASPIRIN 81 MG PO TBEC
81.0000 mg | DELAYED_RELEASE_TABLET | Freq: Every day | ORAL | Status: DC
  Administered 2022-10-04: 81 mg via ORAL
  Filled 2022-10-03 (×2): qty 1

## 2022-10-03 MED ORDER — ACETAMINOPHEN 325 MG PO TABS: 650.0000 mg | ORAL_TABLET | ORAL | Status: DC | PRN

## 2022-10-03 MED ORDER — HEPARIN (PORCINE) 25000 UT/250ML-% IV SOLN
1000.0000 [IU]/h | INTRAVENOUS | Status: DC
  Administered 2022-10-03 – 2022-10-04 (×2): 1100 [IU]/h via INTRAVENOUS
  Filled 2022-10-03 (×2): qty 250

## 2022-10-03 MED ORDER — ONDANSETRON HCL 4 MG/2ML IJ SOLN: 4.0000 mg | Freq: Four times a day (QID) | INTRAMUSCULAR | Status: DC | PRN

## 2022-10-03 MED ORDER — METOPROLOL TARTRATE 12.5 MG HALF TABLET
12.5000 mg | ORAL_TABLET | Freq: Two times a day (BID) | ORAL | Status: DC
  Administered 2022-10-03 – 2022-10-04 (×4): 12.5 mg via ORAL
  Filled 2022-10-03 (×4): qty 1

## 2022-10-03 MED ORDER — NITROGLYCERIN 0.4 MG SL SUBL: 0.4000 mg | SUBLINGUAL_TABLET | SUBLINGUAL | Status: DC | PRN

## 2022-10-03 MED ORDER — PANTOPRAZOLE SODIUM 40 MG PO TBEC
40.0000 mg | DELAYED_RELEASE_TABLET | Freq: Every day | ORAL | Status: DC
  Administered 2022-10-03 – 2022-10-04 (×2): 40 mg via ORAL
  Filled 2022-10-03 (×2): qty 1

## 2022-10-03 NOTE — Progress Notes (Signed)
ANTICOAGULATION CONSULT NOTE - Initial Consult  Pharmacy Consult for heparin Indication:  CAD awaiting CVTS consult  Allergies  Allergen Reactions   Codeine Nausea Only   Vital Signs: Temp: 98.6 F (37 C) (03/16 0314) Temp Source: Oral (03/16 0314) BP: 103/45 (03/16 0314) Pulse Rate: 71 (03/16 0314)  Labs: Recent Labs    10/02/22 0234 10/02/22 0429 10/02/22 1043 10/02/22 1215 10/03/22 0545  HGB 13.0  --   --   --  11.3*  HCT 38.6*  --   --   --  31.7*  PLT 177  --   --   --  149*  APTT 34  --   --   --   --   LABPROT 13.1  --   --   --   --   INR 1.0  --   --   --   --   HEPARINUNFRC  --   --  0.66  --  0.57  CREATININE 0.96  --   --   --  1.11  TROPONINIHS 187* 1,346* 6,534* 6,270*  --      Estimated Creatinine Clearance: 45.7 mL/min (by C-G formula based on SCr of 1.11 mg/dL).   Medical History: Past Medical History:  Diagnosis Date   TIA (transient ischemic attack)     Assessment: 77yo with NSTEMI s/p LHC with mvCAD. Pt remains on IV heparin. Heparin level therapeutic at 0.57, CBC stable.  Goal of Therapy:  Heparin level 0.3-0.7 units/ml Monitor platelets by anticoagulation protocol: Yes   Plan:  Continue heparin infusion at 1100 units/hr Daily heparin level and CBC  Arrie Senate, PharmD, [place], [facility] Clinical Pharmacist [phone] Please check AMION for all [place] numbers 10/03/2022

## 2022-10-03 NOTE — Consult Note (Signed)
Ruben [name]       [place] [address]             [phone]           Ruben [name] [place] Medical Record [id number] Date of Birth: 01/13/1946  Ruben [name], [name] Ruben [name], [name]  Chief Complaint:   NSTEMI with LM stenosis  History of Present Illness:     Pt is a very pleasant 77 yo male who has a history of paroxysmal atrial fibrillation and TIA who presented to outside hospital with substernal chest [name]. Pt presented with EKG changes and positive troponins and was taken to cath lab and found to have severe LM stenosis and 3VCAD. Echo with EF 60%. Pt was [name] free and sent to [facility] for surgical evaluation. Pt has been on heparin infusion and has been [name] free. Pt with father with CABG in early 60s and he has been very strict on his diet from 2018 on with fruits and vegetables and no sugar. He refused eliquis for his TIAs and paroxysmal Afib. No issues since 2022 and carotid duplex at that time with no stenosis      Past Medical History:  Diagnosis Date   TIA (transient ischemic attack)     Past Surgical History:  Procedure Laterality Date   SHOULDER ARTHROSCOPY      Social History   Tobacco Use  Smoking Status Never  Smokeless Tobacco Never    Social History   Substance and Sexual Activity  Alcohol Use Not Currently    Social History   Socioeconomic History   Marital status: Married    Spouse name: Not on file   Number of children: Not on file   Years of education: Not on file   Highest education level: Not on file  Occupational History   Not on file  Tobacco Use   Smoking status: Never   Smokeless tobacco: Never  Substance and Sexual Activity   Alcohol use: Not Currently   Drug use: Never   Sexual activity: Yes  Other Topics Concern   Not on file  Social History Narrative   Not on file   Social Determinants of Health   Financial Resource Strain: Not on file  Food Insecurity: Not on file  Transportation  Needs: Not on file  Physical Activity: Not on file  Stress: Not on file  Social Connections: Not on file  Intimate Partner Violence: Not on file    Allergies  Allergen Reactions   Codeine Nausea Only    Current Facility-Administered Medications  Medication Dose Route Frequency Provider Last Rate Last Admin   acetaminophen (TYLENOL) tablet 650 mg  650 mg Oral Q4H PRN Ruben [name], [name]       [START ON 10/04/2022] aspirin EC tablet 81 mg  81 mg Oral Daily Ruben [name], [name]       heparin ADULT infusion 100 units/mL (25000 units/295mL)  1,100 Units/hr Intravenous Continuous Ruben [name], Ruben [name] 11 mL/hr at 10/03/22 0618 1,100 Units/hr at 10/03/22 0618   metoprolol tartrate (LOPRESSOR) tablet 12.5 mg  12.5 mg Oral BID Ruben [name], [name]       nitroGLYCERIN (NITROSTAT) SL tablet 0.4 mg  0.4 mg Sublingual Q5 Min x 3 PRN Ruben [name], [name]       ondansetron Ruben [name]) injection 4 mg  4 mg Intravenous Q6H PRN Ruben [name], [name]       pantoprazole (  PROTONIX) EC tablet 40 mg  40 mg Oral Daily Ruben [name], [name]       rosuvastatin (CRESTOR) tablet 20 mg  20 mg Oral Daily Ruben [name], [name]         Family History  Problem Relation Age of Onset   Colon cancer Mother    Heart disease Father        Physical Exam: BP (!) 98/35 (BP Location: Left Arm)   Pulse (!) 57   Temp 98.6 F (37 C) (Oral)   Resp 17   SpO2 95%  Lungs: clear Card: rr without murmur Ext: no varicosities and no edema Neuro: intact    Diagnostic Studies & Laboratory data: I have personally reviewed the following studies and agree with the findings     Recent Radiology Findings:   CARDIAC CATHETERIZATION  Result Date: 10/02/2022   Ost LM to Mid LM lesion is 75% stenosed.   Ost RCA to Prox RCA lesion is 90% stenosed.   Dist LM to Ost LAD lesion is 100% stenosed.   Ost Cx to Dist Cx lesion is 50% stenosed.   Prox RCA to Mid RCA lesion is 50%  stenosed.   The left ventricular systolic function is normal.   LV end diastolic pressure is normal.   The left ventricular ejection fraction is 50-55% by visual estimate.   There is no mitral valve regurgitation. Conclusion Status post non-STEMI presentation Left heart cath right radial approach Left ventriculogram showed preserved left ventricular function EF of at least 55% Coronaries Left main large ostial 75 hazy calcified lesion LAD 100% occluded ostially TIMI I flow IRA Circumflex medium to small diffuse 50% RCA large 90% proximal 50% mid TIMI-3 flow Right dominant system Faint collaterals Intervention deferred not indicated Recommend coronary bypass surgery at a tertiary care center  ECHOCARDIOGRAM COMPLETE  Result Date: 10/02/2022    ECHOCARDIOGRAM REPORT   Patient Name:   Ruben [name] Date of Exam: 10/02/2022 Medical Rec #:  [id number]        Height:       68.0 in Accession #:    [id number]       Weight:       185.0 lb Date of Birth:  Oct 27, 1945       BSA:          1.977 m Patient Age:    23 years         BP:           124/71 mmHg Patient Gender: M                HR:           56 bpm. Exam Location:  [facility] Procedure: 2D Echo, Cardiac Doppler and Color Doppler Indications:     NSTEMI  History:         Patient has prior history of Echocardiogram examinations, most                  recent 10/29/2020. Acute MI, TIA; Arrythmias:Tachycardia.  Sonographer:     Wenda Low Referring Phys:  [id number] Ruben [name] Diagnosing Phys: Ruben [name]  Sonographer Comments: Technically difficult study due to poor echo windows and suboptimal parasternal window. IMPRESSIONS  1. Left ventricular ejection fraction, by estimation, is 60 to 65%. The left ventricle has normal function. The left ventricle has no regional wall motion abnormalities. Left ventricular diastolic parameters are consistent with Grade I diastolic dysfunction (impaired relaxation).  2. Right ventricular systolic function is normal. The  right ventricular size is normal. There is normal pulmonary artery systolic pressure.  3. The mitral valve is normal in structure. Trivial mitral valve regurgitation.  4. The aortic valve is normal in structure. Aortic valve regurgitation is not visualized. FINDINGS  Left Ventricle: Left ventricular ejection fraction, by estimation, is 60 to 65%. The left ventricle has normal function. The left ventricle has no regional wall motion abnormalities. The left ventricular internal cavity size was normal in size. There is  no left ventricular hypertrophy. Left ventricular diastolic parameters are consistent with Grade I diastolic dysfunction (impaired relaxation). Right Ventricle: The right ventricular size is normal. No increase in right ventricular wall thickness. Right ventricular systolic function is normal. There is normal pulmonary artery systolic pressure. The tricuspid regurgitant velocity is 1.59 m/s, and  with an assumed right atrial pressure of 3 mmHg, the estimated right ventricular systolic pressure is [id number] mmHg. Left Atrium: Left atrial size was normal in size. Right Atrium: Right atrial size was normal in size. Pericardium: There is no evidence of pericardial effusion. Mitral Valve: The mitral valve is normal in structure. There is moderate thickening of the mitral valve leaflet(s). Normal mobility of the mitral valve leaflets. Trivial mitral valve regurgitation. MV peak gradient, 3.0 mmHg. The mean mitral valve gradient is 1.0 mmHg. Tricuspid Valve: The tricuspid valve is normal in structure. Tricuspid valve regurgitation is trivial. Aortic Valve: The aortic valve is normal in structure. Aortic valve regurgitation is not visualized. Aortic valve mean gradient measures 2.0 mmHg. Aortic valve peak gradient measures 3.2 mmHg. Aortic valve area, by VTI measures 2.51 cm. Pulmonic Valve: The pulmonic valve was normal in structure. Pulmonic valve regurgitation is not visualized. Aorta: The ascending aorta was  not well visualized. IAS/Shunts: No atrial level shunt detected by color flow Doppler.  LEFT VENTRICLE PLAX 2D LVIDd:         4.30 cm   Diastology LVIDs:         2.00 cm   LV e' medial:    7.18 cm/s LV PW:         1.00 cm   LV E/e' medial:  9.5 LV IVS:        0.80 cm   LV e' lateral:   8.70 cm/s LVOT diam:     1.90 cm   LV E/e' lateral: 7.8 LV SV:         50 LV SV Index:   25 LVOT Area:     2.84 cm  RIGHT VENTRICLE RV Basal diam:  2.85 cm RV Mid diam:    2.80 cm RV S prime:     15.60 cm/s TAPSE (M-mode): 2.2 cm LEFT ATRIUM             Index        RIGHT ATRIUM           Index LA diam:        3.20 cm 1.62 cm/m   RA Area:     12.10 cm LA Vol (A2C):   64.3 ml 32.52 ml/m  RA Volume:   25.80 ml  13.05 ml/m LA Vol (A4C):   77.9 ml 39.40 ml/m LA Biplane Vol: 71.3 ml 36.06 ml/m  AORTIC VALVE                    PULMONIC VALVE AV Area (Vmax):    2.57 cm     PV Vmax:  0.63 m/s AV Area (Vmean):   2.44 cm     PV Peak grad:  1.6 mmHg AV Area (VTI):     2.51 cm AV Vmax:           88.80 cm/s AV Vmean:          61.300 cm/s AV VTI:            0.199 m AV Peak Grad:      3.2 mmHg AV Mean Grad:      2.0 mmHg LVOT Vmax:         80.60 cm/s LVOT Vmean:        52.700 cm/s LVOT VTI:          0.176 m LVOT/AV VTI ratio: 0.88  AORTA Ao Root diam: 3.10 cm MITRAL VALVE               TRICUSPID VALVE MV Area (PHT): 2.74 cm    TR Peak grad:   10.1 mmHg MV Area VTI:   1.68 cm    TR Vmax:        159.00 cm/s MV Peak grad:  3.0 mmHg MV Mean grad:  1.0 mmHg    SHUNTS MV Vmax:       0.87 m/s    Systemic VTI:  0.18 m MV Vmean:      38.9 cm/s   Systemic Diam: 1.90 cm MV Decel Time: 277 msec MV E velocity: 68.10 cm/s MV A velocity: 84.90 cm/s MV E/A ratio:  0.80 Ruben [name] Electronically signed by Ruben [name] Signature Date/Time: 10/02/2022/1:47:29 PM    Final    DG Chest Port 1 View  Result Date: 10/02/2022 CLINICAL DATA:  Palpitations EXAM: PORTABLE CHEST 1 VIEW COMPARISON:  None Available. FINDINGS: The heart size  and mediastinal contours are within normal limits. Both lungs are clear. The visualized skeletal structures are unremarkable. IMPRESSION: No active disease. Electronically Signed   By: Placido Sou M.D.   On: 10/02/2022 02:46      Recent Lab Findings: Lab Results  Component Value Date   WBC 4.5 10/03/2022   HGB 11.3 (L) 10/03/2022   HCT 31.7 (L) 10/03/2022   PLT 149 (L) 10/03/2022   GLUCOSE 82 10/03/2022   CHOL 245 (H) 10/29/2020   TRIG 335 (H) 10/29/2020   HDL 34 (L) 10/29/2020   LDLCALC 144 (H) 10/29/2020   ALT 18 10/03/2022   AST 36 10/03/2022   NA 140 10/03/2022   K 3.7 10/03/2022   CL 108 10/03/2022   CREATININE 1.11 10/03/2022   BUN 16 10/03/2022   CO2 23 10/03/2022   TSH 2.52 11/03/2013   INR 1.0 10/02/2022   HGBA1C 5.4 10/02/2022      Assessment / Plan:     Pt with NSTEMI and now with normal LV function and severe LM and 3 VCAD. Pt would best be served with CABG (LAD visualized by mostly collaterals but should be able to be grafted, OM and RCA grafts). Would benefit from LAA occlusion at time of surgery. All the risks and goals and recovery expectations from CABG were discussed and pt agrees to proceed. I have discussed this with Dr Cyndia Bent who agrees to assume surgical care on Monday am with CABG.    I have spent 60 min in review of the records, viewing studies and in face to face with patient and in coordination of future care    Coralie Common 10/03/2022 9:48 AM

## 2022-10-03 NOTE — H&P (Signed)
Cardiology Admission History and Physical   Patient ID: Ruben [name] MRN: [id number]; DOB: [birth year]   Admission date: 10/03/2022  PCP:  [name], MD   [facility] Prov[facility] Cardiologist:  None        Chief Complaint:  chest pain  Patient Profile:   Ruben [name] is a 77 y.o. male with TIA, atrial fibrillation, peptic ulcer disease, hyperlipidemia admitted with chest pain.  History of Present Illness:   Ruben [name] presented to [facility] ED with complains of palpitations and chest pressure. After initial workup he was noted to have troponin elevation that peaked at 6500 and he was admitted under the diagnosis of NSTEMI. He was taken to the cardiac catheterization lab and coronary angiography revealed presence of multivessel CAD including LM disease. Left ventricular function preserved based on echo. CABG was recommended and he was transferred to [facility] for further care. Pre CABG w/u completed. On arrival, patient hemodynamically stable and chest pain free.   Past Medical History:  Diagnosis Date   TIA (transient ischemic attack)     Past Surgical History:  Procedure Laterality Date   SHOULDER ARTHROSCOPY       Medications Prior to Admission: Prior to Admission medications   Medication Sig Start Date End Date Taking? Authorizing Provider  aspirin EC 81 MG EC tablet Take 1 tablet (81 mg total) by mouth daily. Swallow whole. 10/30/20   Sharen Hones, MD  cholecalciferol (VITAMIN D3) 25 MCG (1000 UNIT) tablet Take 1,000 Units by mouth daily.    [provider]  heparin 25000 UT/250ML infusion Inject 1,100 Units/hr into the vein continuous. 10/02/22   Callwood, Dwayne D, MD  rosuvastatin (CRESTOR) 20 MG tablet Take 2 tablets (40 mg total) by mouth at bedtime. 10/02/22 10/02/23  Callwood, Karma Greaser D, MD  vitamin B-12 (CYANOCOBALAMIN) 1000 MCG tablet Take 1,000 mcg by mouth daily.    [provider]  vitamin C (ASCORBIC ACID) 250 MG tablet  Take 250 mg by mouth daily.    [provider]     Allergies:    Allergies  Allergen Reactions   Codeine Nausea Only    Social History:   Social History   Socioeconomic History   Marital status: Married    Spouse name: Not on file   Number of children: Not on file   Years of education: Not on file   Highest education level: Not on file  Occupational History   Not on file  Tobacco Use   Smoking status: Never   Smokeless tobacco: Never  Substance and Sexual Activity   Alcohol use: Not Currently   Drug use: Never   Sexual activity: Yes  Other Topics Concern   Not on file  Social History Narrative   Not on file   Social Determinants of Health   Financial Resource Strain: Not on file  Food Insecurity: Not on file  Transportation Needs: Not on file  Physical Activity: Not on file  Stress: Not on file  Social Connections: Not on file  Intimate Partner Violence: Not on file    Family History:   The patient's family history includes Colon cancer in his mother; Heart disease in his father.    ROS:  Please see the history of present illness.  All other ROS reviewed and negative.     Physical Exam/Data:   Vitals:   10/03/22 0016  BP: (!) 126/53  Pulse: (!) 56  Resp: 19  Temp: 98.6 F (37 C)  TempSrc: Oral   No intake or output data in the 24 hours ending 10/03/22 0201    10/02/2022   12:19 PM 10/02/2022    2:22 AM 10/28/2020    8:31 PM  Last 3 Weights  Weight (lbs) 123 lb 14.4 oz 185 lb 141 lb 15.6 oz  Weight (kg) 56.201 kg 83.915 kg 64.4 kg     There is no height or weight on file to calculate BMI.  General:  Well nourished, well developed, in no acute distress HEENT: normal Neck: no JVD Vascular: No carotid bruits; Distal pulses 2+ bilaterally   Cardiac:  normal S1, S2; RRR; no murmur  Lungs:  clear to auscultation bilaterally, no wheezing, rhonchi or rales  Abd: soft, nontender, no hepatomegaly  Ext: no edema Musculoskeletal:  No  deformities, BUE and BLE strength normal and equal Skin: warm and dry  Neuro:  CNs 2-12 intact, no focal abnormalities noted Psych:  Normal affect    EKG:  The ECG that was done  was personally reviewed and demonstrates   Relevant CV Studies:   Laboratory Data:  High Sensitivity Troponin:   Recent Labs  Lab 10/02/22 0234 10/02/22 0429 10/02/22 1043 10/02/22 1215  TROPONINIHS 187* 1,346* 6,534* 6,270*      Chemistry Recent Labs  Lab 10/02/22 0234  NA 140  K 3.6  CL 104  CO2 25  GLUCOSE 104*  BUN 13  CREATININE 0.96  CALCIUM 8.7*  GFRNONAA >60  ANIONGAP 11    No results for input(s): "PROT", "ALBUMIN", "AST", "ALT", "ALKPHOS", "BILITOT" in the last 168 hours. Lipids No results for input(s): "CHOL", "TRIG", "HDL", "LABVLDL", "LDLCALC", "CHOLHDL" in the last 168 hours. Hematology Recent Labs  Lab 10/02/22 0234  WBC 4.7  RBC 3.70*  HGB 13.0  HCT 38.6*  MCV 104.3*  MCH 35.1*  MCHC 33.7  RDW 11.8  PLT 177   Thyroid No results for input(s): "TSH", "FREET4" in the last 168 hours. BNPNo results for input(s): "BNP", "PROBNP" in the last 168 hours.  DDimer No results for input(s): "DDIMER" in the last 168 hours.   Radiology/Studies:  CARDIAC CATHETERIZATION  Result Date: 10/02/2022   Ost LM to Mid LM lesion is 75% stenosed.   Ost RCA to Prox RCA lesion is 90% stenosed.   Dist LM to Ost LAD lesion is 100% stenosed.   Ost Cx to Dist Cx lesion is 50% stenosed.   Prox RCA to Mid RCA lesion is 50% stenosed.   The left ventricular systolic function is normal.   LV end diastolic pressure is normal.   The left ventricular ejection fraction is 50-55% by visual estimate.   There is no mitral valve regurgitation. Conclusion Status post non-STEMI presentation Left heart cath right radial approach Left ventriculogram showed preserved left ventricular function EF of at least 55% Coronaries Left main large ostial 75 hazy calcified lesion LAD 100% occluded ostially TIMI I flow IRA  Circumflex medium to small diffuse 50% RCA large 90% proximal 50% mid TIMI-3 flow Right dominant system Faint collaterals Intervention deferred not indicated Recommend coronary bypass surgery at a tertiary care center  ECHOCARDIOGRAM COMPLETE  Result Date: 10/02/2022    ECHOCARDIOGRAM REPORT   Patient Name:   Ruben [name] Date of Exam: 10/02/2022 Medical Rec #:  [id number]        Height:       68.0 in Accession #:    [id number]       Weight:       185.0  lb Date of Birth:  05/11/1946       BSA:          1.977 m Patient Age:    23 years         BP:           124/71 mmHg Patient Gender: M                HR:           56 bpm. Exam Location:  [facility] Procedure: 2D Echo, Cardiac Doppler and Color Doppler Indications:     NSTEMI  History:         Patient has prior history of Echocardiogram examinations, most                  recent 10/29/2020. Acute MI, TIA; Arrythmias:Tachycardia.  Sonographer:     Wenda Low Referring Phys:  [id number] [place] TANG Diagnosing Phys: Yolonda Kida MD  Sonographer Comments: Technically difficult study due to poor echo windows and suboptimal parasternal window. IMPRESSIONS  1. Left ventricular ejection fraction, by estimation, is 60 to 65%. The left ventricle has normal function. The left ventricle has no regional wall motion abnormalities. Left ventricular diastolic parameters are consistent with Grade I diastolic dysfunction (impaired relaxation).  2. Right ventricular systolic function is normal. The right ventricular size is normal. There is normal pulmonary artery systolic pressure.  3. The mitral valve is normal in structure. Trivial mitral valve regurgitation.  4. The aortic valve is normal in structure. Aortic valve regurgitation is not visualized. FINDINGS  Left Ventricle: Left ventricular ejection fraction, by estimation, is 60 to 65%. The left ventricle has normal function. The left ventricle has no regional wall motion abnormalities. The left ventricular internal  cavity size was normal in size. There is  no left ventricular hypertrophy. Left ventricular diastolic parameters are consistent with Grade I diastolic dysfunction (impaired relaxation). Right Ventricle: The right ventricular size is normal. No increase in right ventricular wall thickness. Right ventricular systolic function is normal. There is normal pulmonary artery systolic pressure. The tricuspid regurgitant velocity is 1.59 m/s, and  with an assumed right atrial pressure of 3 mmHg, the estimated right ventricular systolic pressure is [id number] mmHg. Left Atrium: Left atrial size was normal in size. Right Atrium: Right atrial size was normal in size. Pericardium: There is no evidence of pericardial effusion. Mitral Valve: The mitral valve is normal in structure. There is moderate thickening of the mitral valve leaflet(s). Normal mobility of the mitral valve leaflets. Trivial mitral valve regurgitation. MV peak gradient, 3.0 mmHg. The mean mitral valve gradient is 1.0 mmHg. Tricuspid Valve: The tricuspid valve is normal in structure. Tricuspid valve regurgitation is trivial. Aortic Valve: The aortic valve is normal in structure. Aortic valve regurgitation is not visualized. Aortic valve mean gradient measures 2.0 mmHg. Aortic valve peak gradient measures 3.2 mmHg. Aortic valve area, by VTI measures 2.51 cm. Pulmonic Valve: The pulmonic valve was normal in structure. Pulmonic valve regurgitation is not visualized. Aorta: The ascending aorta was not well visualized. IAS/Shunts: No atrial level shunt detected by color flow Doppler.  LEFT VENTRICLE PLAX 2D LVIDd:         4.30 cm   Diastology LVIDs:         2.00 cm   LV e' medial:    7.18 cm/s LV PW:         1.00 cm   LV E/e' medial:  9.5 LV IVS:  0.80 cm   LV e' lateral:   8.70 cm/s LVOT diam:     1.90 cm   LV E/e' lateral: 7.8 LV SV:         50 LV SV Index:   25 LVOT Area:     2.84 cm  RIGHT VENTRICLE RV Basal diam:  2.85 cm RV Mid diam:    2.80 cm RV S prime:      15.60 cm/s TAPSE (M-mode): 2.2 cm LEFT ATRIUM             Index        RIGHT ATRIUM           Index LA diam:        3.20 cm 1.62 cm/m   RA Area:     12.10 cm LA Vol (A2C):   64.3 ml 32.52 ml/m  RA Volume:   25.80 ml  13.05 ml/m LA Vol (A4C):   77.9 ml 39.40 ml/m LA Biplane Vol: 71.3 ml 36.06 ml/m  AORTIC VALVE                    PULMONIC VALVE AV Area (Vmax):    2.57 cm     PV Vmax:       0.63 m/s AV Area (Vmean):   2.44 cm     PV Peak grad:  1.6 mmHg AV Area (VTI):     2.51 cm AV Vmax:           88.80 cm/s AV Vmean:          61.300 cm/s AV VTI:            0.199 m AV Peak Grad:      3.2 mmHg AV Mean Grad:      2.0 mmHg LVOT Vmax:         80.60 cm/s LVOT Vmean:        52.700 cm/s LVOT VTI:          0.176 m LVOT/AV VTI ratio: 0.88  AORTA Ao Root diam: 3.10 cm MITRAL VALVE               TRICUSPID VALVE MV Area (PHT): 2.74 cm    TR Peak grad:   10.1 mmHg MV Area VTI:   1.68 cm    TR Vmax:        159.00 cm/s MV Peak grad:  3.0 mmHg MV Mean grad:  1.0 mmHg    SHUNTS MV Vmax:       0.87 m/s    Systemic VTI:  0.18 m MV Vmean:      38.9 cm/s   Systemic Diam: 1.90 cm MV Decel Time: 277 msec MV E velocity: 68.10 cm/s MV A velocity: 84.90 cm/s MV E/A ratio:  0.80 Dwayne D Callwood MD Electronically signed by Yolonda Kida MD Signature Date/Time: 10/02/2022/1:47:29 PM    Final    DG Chest Port 1 View  Result Date: 10/02/2022 CLINICAL DATA:  Palpitations EXAM: PORTABLE CHEST 1 VIEW COMPARISON:  None Available. FINDINGS: The heart size and mediastinal contours are within normal limits. Both lungs are clear. The visualized skeletal structures are unremarkable. IMPRESSION: No active disease. Electronically Signed   By: Placido Sou M.D.   On: 10/02/2022 02:46     Assessment and Plan:   77 y/o with TIA, atrial fibrillation, peptic ulcer disease, hyperlipidemia admitted with NSTEMI, found to have multivessel disease and is now awaiting for CABG  NSTEMI Multivessel coronary artery disease  Non valvular  atrial fibrillation. Declined a/c  - Continue ASA 81 mg daily - Continue heparin drip - Holding BB given baseline bradycardia - Continue pantoprazole 40 mg daily - Continue Crestor 20 mg daily - NPO   Risk Assessment/Risk Scores:           Severity of Illness: The appropriate patient status for this patient is INPATIENT. Inpatient status is judged to be reasonable and necessary in order to provide the required intensity of service to ensure the patient's safety. The patient's presenting symptoms, physical exam findings, and initial radiographic and laboratory data in the context of their chronic comorbidities is felt to place them at high risk for further clinical deterioration. Furthermore, it is not anticipated that the patient will be medically stable for discharge from the hospital within 2 midnights of admission.   * I certify that at the point of admission it is my clinical judgment that the patient will require inpatient hospital care spanning beyond 2 midnights from the point of admission due to high intensity of service, high risk for further deterioration and high frequency of surveillance required.*   For questions or updates, please contact [place] Please consult [email or web address] for contact info under     Signed, [name], MD  10/03/2022 2:01 AM

## 2022-10-03 NOTE — Progress Notes (Signed)
Bilateral carotid ultrasound study completed.   Please see CV Procedures for preliminary results.  Candie Chroman, RVT  4:24 PM 10/03/22

## 2022-10-03 NOTE — H&P (View-Only) (Signed)
[address]       [place], [address]             [phone]           Sachit D Pepperman Nelson Medical Record [id number] Date of Birth: 1945/08/21  Yolonda Kida, MD [name], MD  Chief Complaint:   NSTEMI with LM stenosis  History of Present Illness:     Pt is a very pleasant 77 yo male who has a history of paroxysmal atrial fibrillation and TIA who presented to outside hospital with substernal chest pain. Pt presented with EKG changes and positive troponins and was taken to cath lab and found to have severe LM stenosis and 3VCAD. Echo with EF 60%. Pt was pain free and sent to [facility] for surgical evaluation. Pt has been on heparin infusion and has been pain free. Pt with father with CABG in early 60s and he has been very strict on his diet from 2018 on with fruits and vegetables and no sugar. He refused eliquis for his TIAs and paroxysmal Afib. No issues since 2022 and carotid duplex at that time with no stenosis      Past Medical History:  Diagnosis Date   TIA (transient ischemic attack)     Past Surgical History:  Procedure Laterality Date   SHOULDER ARTHROSCOPY      Social History   Tobacco Use  Smoking Status Never  Smokeless Tobacco Never    Social History   Substance and Sexual Activity  Alcohol Use Not Currently    Social History   Socioeconomic History   Marital status: Married    Spouse name: Not on file   Number of children: Not on file   Years of education: Not on file   Highest education level: Not on file  Occupational History   Not on file  Tobacco Use   Smoking status: Never   Smokeless tobacco: Never  Substance and Sexual Activity   Alcohol use: Not Currently   Drug use: Never   Sexual activity: Yes  Other Topics Concern   Not on file  Social History Narrative   Not on file   Social Determinants of Health   Financial Resource Strain: Not on file  Food Insecurity: Not on file  Transportation  Needs: Not on file  Physical Activity: Not on file  Stress: Not on file  Social Connections: Not on file  Intimate Partner Violence: Not on file    Allergies  Allergen Reactions   Codeine Nausea Only    Current Facility-Administered Medications  Medication Dose Route Frequency Provider Last Rate Last Admin   acetaminophen (TYLENOL) tablet 650 mg  650 mg Oral Q4H PRN [name], MD       [START ON 10/04/2022] aspirin EC tablet 81 mg  81 mg Oral Daily [name], MD       heparin ADULT infusion 100 units/mL (25000 units/292mL)  1,100 Units/hr Intravenous Continuous [name], [name] 11 mL/hr at 10/03/22 0618 1,100 Units/hr at 10/03/22 0618   metoprolol tartrate (LOPRESSOR) tablet 12.5 mg  12.5 mg Oral BID [name], MD       nitroGLYCERIN (NITROSTAT) SL tablet 0.4 mg  0.4 mg Sublingual Q5 Min x 3 PRN [name], MD       ondansetron [facility]) injection 4 mg  4 mg Intravenous Q6H PRN [name], MD       pantoprazole (  PROTONIX) EC tablet 40 mg  40 mg Oral Daily [name], MD       rosuvastatin (CRESTOR) tablet 20 mg  20 mg Oral Daily [name], MD         Family History  Problem Relation Age of Onset   Colon cancer Mother    Heart disease Father        Physical Exam: BP (!) 98/35 (BP Location: Left Arm)   Pulse (!) 57   Temp 98.6 F (37 C) (Oral)   Resp 17   SpO2 95%  Lungs: clear Card: rr without murmur Ext: no varicosities and no edema Neuro: intact    Diagnostic Studies & Laboratory data: I have personally reviewed the following studies and agree with the findings     Recent Radiology Findings:   CARDIAC CATHETERIZATION  Result Date: 10/02/2022   Ost LM to Mid LM lesion is 75% stenosed.   Ost RCA to Prox RCA lesion is 90% stenosed.   Dist LM to Ost LAD lesion is 100% stenosed.   Ost Cx to Dist Cx lesion is 50% stenosed.   Prox RCA to Mid RCA lesion is 50%  stenosed.   The left ventricular systolic function is normal.   LV end diastolic pressure is normal.   The left ventricular ejection fraction is 50-55% by visual estimate.   There is no mitral valve regurgitation. Conclusion Status post non-STEMI presentation Left heart cath right radial approach Left ventriculogram showed preserved left ventricular function EF of at least 55% Coronaries Left main large ostial 75 hazy calcified lesion LAD 100% occluded ostially TIMI I flow IRA Circumflex medium to small diffuse 50% RCA large 90% proximal 50% mid TIMI-3 flow Right dominant system Faint collaterals Intervention deferred not indicated Recommend coronary bypass surgery at a tertiary care center  ECHOCARDIOGRAM COMPLETE  Result Date: 10/02/2022    ECHOCARDIOGRAM REPORT   Patient Name:   BARTON CANLAS Date of Exam: 10/02/2022 Medical Rec #:  [id number]        Height:       68.0 in Accession #:    [id number]       Weight:       185.0 lb Date of Birth:  06-06-1946       BSA:          1.977 m Patient Age:    60 years         BP:           124/71 mmHg Patient Gender: M                HR:           56 bpm. Exam Location:  [facility] Procedure: 2D Echo, Cardiac Doppler and Color Doppler Indications:     NSTEMI  History:         Patient has prior history of Echocardiogram examinations, most                  recent 10/29/2020. Acute MI, TIA; Arrythmias:Tachycardia.  Sonographer:     Wenda Low Referring Phys:  [id number] [place] TANG Diagnosing Phys: Yolonda Kida MD  Sonographer Comments: Technically difficult study due to poor echo windows and suboptimal parasternal window. IMPRESSIONS  1. Left ventricular ejection fraction, by estimation, is 60 to 65%. The left ventricle has normal function. The left ventricle has no regional wall motion abnormalities. Left ventricular diastolic parameters are consistent with Grade I diastolic dysfunction (impaired relaxation).  2. Right ventricular systolic function is normal. The  right ventricular size is normal. There is normal pulmonary artery systolic pressure.  3. The mitral valve is normal in structure. Trivial mitral valve regurgitation.  4. The aortic valve is normal in structure. Aortic valve regurgitation is not visualized. FINDINGS  Left Ventricle: Left ventricular ejection fraction, by estimation, is 60 to 65%. The left ventricle has normal function. The left ventricle has no regional wall motion abnormalities. The left ventricular internal cavity size was normal in size. There is  no left ventricular hypertrophy. Left ventricular diastolic parameters are consistent with Grade I diastolic dysfunction (impaired relaxation). Right Ventricle: The right ventricular size is normal. No increase in right ventricular wall thickness. Right ventricular systolic function is normal. There is normal pulmonary artery systolic pressure. The tricuspid regurgitant velocity is 1.59 m/s, and  with an assumed right atrial pressure of 3 mmHg, the estimated right ventricular systolic pressure is [id number]. Left Atrium: Left atrial size was normal in size. Right Atrium: Right atrial size was normal in size. Pericardium: There is no evidence of pericardial effusion. Mitral Valve: The mitral valve is normal in structure. There is moderate thickening of the mitral valve leaflet(s). Normal mobility of the mitral valve leaflets. Trivial mitral valve regurgitation. MV peak gradient, 3.0 mmHg. The mean mitral valve gradient is 1.0 mmHg. Tricuspid Valve: The tricuspid valve is normal in structure. Tricuspid valve regurgitation is trivial. Aortic Valve: The aortic valve is normal in structure. Aortic valve regurgitation is not visualized. Aortic valve mean gradient measures 2.0 mmHg. Aortic valve peak gradient measures 3.2 mmHg. Aortic valve area, by VTI measures 2.51 cm. Pulmonic Valve: The pulmonic valve was normal in structure. Pulmonic valve regurgitation is not visualized. Aorta: The ascending aorta was  not well visualized. IAS/Shunts: No atrial level shunt detected by color flow Doppler.  LEFT VENTRICLE PLAX 2D LVIDd:         4.30 cm   Diastology LVIDs:         2.00 cm   LV e' medial:    7.18 cm/s LV PW:         1.00 cm   LV E/e' medial:  9.5 LV IVS:        0.80 cm   LV e' lateral:   8.70 cm/s LVOT diam:     1.90 cm   LV E/e' lateral: 7.8 LV SV:         50 LV SV Index:   25 LVOT Area:     2.84 cm  RIGHT VENTRICLE RV Basal diam:  2.85 cm RV Mid diam:    2.80 cm RV S prime:     15.60 cm/s TAPSE (M-mode): 2.2 cm LEFT ATRIUM             Index        RIGHT ATRIUM           Index LA diam:        3.20 cm 1.62 cm/m   RA Area:     12.10 cm LA Vol (A2C):   64.3 ml 32.52 ml/m  RA Volume:   25.80 ml  13.05 ml/m LA Vol (A4C):   77.9 ml 39.40 ml/m LA Biplane Vol: 71.3 ml 36.06 ml/m  AORTIC VALVE                    PULMONIC VALVE AV Area (Vmax):    2.57 cm     PV Vmax:  0.63 m/s AV Area (Vmean):   2.44 cm     PV Peak grad:  1.6 mmHg AV Area (VTI):     2.51 cm AV Vmax:           88.80 cm/s AV Vmean:          61.300 cm/s AV VTI:            0.199 m AV Peak Grad:      3.2 mmHg AV Mean Grad:      2.0 mmHg LVOT Vmax:         80.60 cm/s LVOT Vmean:        52.700 cm/s LVOT VTI:          0.176 m LVOT/AV VTI ratio: 0.88  AORTA Ao Root diam: 3.10 cm MITRAL VALVE               TRICUSPID VALVE MV Area (PHT): 2.74 cm    TR Peak grad:   10.1 mmHg MV Area VTI:   1.68 cm    TR Vmax:        159.00 cm/s MV Peak grad:  3.0 mmHg MV Mean grad:  1.0 mmHg    SHUNTS MV Vmax:       0.87 m/s    Systemic VTI:  0.18 m MV Vmean:      38.9 cm/s   Systemic Diam: 1.90 cm MV Decel Time: 277 msec MV E velocity: 68.10 cm/s MV A velocity: 84.90 cm/s MV E/A ratio:  0.80 Dwayne D Callwood MD Electronically signed by Yolonda Kida MD Signature Date/Time: 10/02/2022/1:47:29 PM    Final    DG Chest Port 1 View  Result Date: 10/02/2022 CLINICAL DATA:  Palpitations EXAM: PORTABLE CHEST 1 VIEW COMPARISON:  None Available. FINDINGS: The heart size  and mediastinal contours are within normal limits. Both lungs are clear. The visualized skeletal structures are unremarkable. IMPRESSION: No active disease. Electronically Signed   By: Placido Sou M.D.   On: 10/02/2022 02:46      Recent Lab Findings: Lab Results  Component Value Date   WBC 4.5 10/03/2022   HGB 11.3 (L) 10/03/2022   HCT 31.7 (L) 10/03/2022   PLT 149 (L) 10/03/2022   GLUCOSE 82 10/03/2022   CHOL 245 (H) 10/29/2020   TRIG 335 (H) 10/29/2020   HDL 34 (L) 10/29/2020   LDLCALC 144 (H) 10/29/2020   ALT 18 10/03/2022   AST 36 10/03/2022   NA 140 10/03/2022   K 3.7 10/03/2022   CL 108 10/03/2022   CREATININE 1.11 10/03/2022   BUN 16 10/03/2022   CO2 23 10/03/2022   TSH 2.52 11/03/2013   INR 1.0 10/02/2022   HGBA1C 5.4 10/02/2022      Assessment / Plan:     Pt with NSTEMI and now with normal LV function and severe LM and 3 VCAD. Pt would best be served with CABG (LAD visualized by mostly collaterals but should be able to be grafted, OM and RCA grafts). Would benefit from LAA occlusion at time of surgery. All the risks and goals and recovery expectations from CABG were discussed and pt agrees to proceed. I have discussed this with Dr Cyndia Bent who agrees to assume surgical care on Monday am with CABG.    I have spent 60 min in review of the records, viewing studies and in face to face with patient and in coordination of future care    Coralie Common 10/03/2022 9:48 AM

## 2022-10-03 NOTE — Plan of Care (Signed)

## 2022-10-03 NOTE — Progress Notes (Signed)
Rounding Note    Patient Name: Ruben [name] Date of Encounter: 10/03/2022  [facility] Cardiologist:  Dr. Saralyn Pilar  Subjective   Comfortable, no active chest pain.  Retired Optometrist.  Spent the last 6 years eating fruits vegetables no sugar.  Father had bypass surgery in his late 11s.  Inpatient Medications    Scheduled Meds:  [START ON 10/04/2022] aspirin EC  81 mg Oral Daily   pantoprazole  40 mg Oral Daily   rosuvastatin  20 mg Oral Daily   Continuous Infusions:  heparin 1,100 Units/hr (10/03/22 0618)   PRN Meds: acetaminophen, nitroGLYCERIN, ondansetron (ZOFRAN) IV   Vital Signs    Vitals:   10/03/22 0016 10/03/22 0314  BP: (!) 126/53 (!) 103/45  Pulse: (!) 56 71  Resp: 19 20  Temp: 98.6 F (37 C) 98.6 F (37 C)  TempSrc: Oral Oral  SpO2:  96%   No intake or output data in the 24 hours ending 10/03/22 0757    10/02/2022   12:19 PM 10/02/2022    2:22 AM 10/28/2020    8:31 PM  Last 3 Weights  Weight (lbs) 123 lb 14.4 oz 185 lb 141 lb 15.6 oz  Weight (kg) 56.201 kg 83.915 kg 64.4 kg      Telemetry    Normal sinus rhythm- Personally Reviewed  ECG    As described below, sinus bradycardia, T wave inversions noted precordial leads- Personally Reviewed  Physical Exam   GEN: No acute distress.  Thin Neck: No JVD, soft left bruit Cardiac: RRR, no murmurs, rubs, or gallops.  Respiratory: Clear to auscultation bilaterally. GI: Soft, nontender, non-distended  MS: No edema; No deformity. Neuro:  Nonfocal  Psych: Normal affect   Labs    High Sensitivity Troponin:   Recent Labs  Lab 10/02/22 0234 10/02/22 0429 10/02/22 1043 10/02/22 1215  TROPONINIHS 187* 1,346* 6,534* 6,270*     Chemistry Recent Labs  Lab 10/02/22 0234 10/03/22 0545  NA 140 140  K 3.6 3.7  CL 104 108  CO2 25 23  GLUCOSE 104* 82  BUN 13 16  CREATININE 0.96 1.11  CALCIUM 8.7* 8.6*  PROT  --  4.9*  ALBUMIN  --  3.1*  AST  --  36  ALT  --  18  ALKPHOS   --  12*  BILITOT  --  1.8*  GFRNONAA >60 >60  ANIONGAP 11 9    Lipids No results for input(s): "CHOL", "TRIG", "HDL", "LABVLDL", "LDLCALC", "CHOLHDL" in the last 168 hours.  Hematology Recent Labs  Lab 10/02/22 0234 10/03/22 0545  WBC 4.7 4.5  RBC 3.70* 3.09*  HGB 13.0 11.3*  HCT 38.6* 31.7*  MCV 104.3* 102.6*  MCH 35.1* 36.6*  MCHC 33.7 35.6  RDW 11.8 12.2  PLT 177 149*   Thyroid No results for input(s): "TSH", "FREET4" in the last 168 hours.  BNPNo results for input(s): "BNP", "PROBNP" in the last 168 hours.  DDimer No results for input(s): "DDIMER" in the last 168 hours.   Radiology    CARDIAC CATHETERIZATION  Result Date: 10/02/2022   Ost LM to Mid LM lesion is 75% stenosed.   Ost RCA to Prox RCA lesion is 90% stenosed.   Dist LM to Ost LAD lesion is 100% stenosed.   Ost Cx to Dist Cx lesion is 50% stenosed.   Prox RCA to Mid RCA lesion is 50% stenosed.   The left ventricular systolic function is normal.   LV end diastolic pressure is  normal.   The left ventricular ejection fraction is 50-55% by visual estimate.   There is no mitral valve regurgitation. Conclusion Status post non-STEMI presentation Left heart cath right radial approach Left ventriculogram showed preserved left ventricular function EF of at least 55% Coronaries Left main large ostial 75 hazy calcified lesion LAD 100% occluded ostially TIMI I flow IRA Circumflex medium to small diffuse 50% RCA large 90% proximal 50% mid TIMI-3 flow Right dominant system Faint collaterals Intervention deferred not indicated Recommend coronary bypass surgery at a tertiary care center  ECHOCARDIOGRAM COMPLETE  Result Date: 10/02/2022    ECHOCARDIOGRAM REPORT   Patient Name:   Ruben [name] Date of Exam: 10/02/2022 Medical Rec #:  [id number]        Height:       68.0 in Accession #:    [id number]       Weight:       185.0 lb Date of Birth:  [birth year]       BSA:          1.977 m Patient Age:    77 years         BP:           124/71  mmHg Patient Gender: M                HR:           56 bpm. Exam Location:  [facility] Procedure: 2D Echo, Cardiac Doppler and Color Doppler Indications:     NSTEMI  History:         Patient has prior history of Echocardiogram examinations, most                  recent 10/29/2020. Acute MI, TIA; Arrythmias:Tachycardia.  Sonographer:     Wenda Low Referring Phys:  [id number] [place] TANG Diagnosing Phys: Yolonda Kida MD  Sonographer Comments: Technically difficult study due to poor echo windows and suboptimal parasternal window. IMPRESSIONS  1. Left ventricular ejection fraction, by estimation, is 60 to 65%. The left ventricle has normal function. The left ventricle has no regional wall motion abnormalities. Left ventricular diastolic parameters are consistent with Grade I diastolic dysfunction (impaired relaxation).  2. Right ventricular systolic function is normal. The right ventricular size is normal. There is normal pulmonary artery systolic pressure.  3. The mitral valve is normal in structure. Trivial mitral valve regurgitation.  4. The aortic valve is normal in structure. Aortic valve regurgitation is not visualized. FINDINGS  Left Ventricle: Left ventricular ejection fraction, by estimation, is 60 to 65%. The left ventricle has normal function. The left ventricle has no regional wall motion abnormalities. The left ventricular internal cavity size was normal in size. There is  no left ventricular hypertrophy. Left ventricular diastolic parameters are consistent with Grade I diastolic dysfunction (impaired relaxation). Right Ventricle: The right ventricular size is normal. No increase in right ventricular wall thickness. Right ventricular systolic function is normal. There is normal pulmonary artery systolic pressure. The tricuspid regurgitant velocity is 1.59 m/s, and  with an assumed right atrial pressure of 3 mmHg, the estimated right ventricular systolic pressure is [id number] mmHg. Left Atrium: Left  atrial size was normal in size. Right Atrium: Right atrial size was normal in size. Pericardium: There is no evidence of pericardial effusion. Mitral Valve: The mitral valve is normal in structure. There is moderate thickening of the mitral valve leaflet(s). Normal mobility of the mitral valve leaflets. Trivial mitral valve regurgitation. MV peak  gradient, 3.0 mmHg. The mean mitral valve gradient is 1.0 mmHg. Tricuspid Valve: The tricuspid valve is normal in structure. Tricuspid valve regurgitation is trivial. Aortic Valve: The aortic valve is normal in structure. Aortic valve regurgitation is not visualized. Aortic valve mean gradient measures 2.0 mmHg. Aortic valve peak gradient measures 3.2 mmHg. Aortic valve area, by VTI measures 2.51 cm. Pulmonic Valve: The pulmonic valve was normal in structure. Pulmonic valve regurgitation is not visualized. Aorta: The ascending aorta was not well visualized. IAS/Shunts: No atrial level shunt detected by color flow Doppler.  LEFT VENTRICLE PLAX 2D LVIDd:         4.30 cm   Diastology LVIDs:         2.00 cm   LV e' medial:    7.18 cm/s LV PW:         1.00 cm   LV E/e' medial:  9.5 LV IVS:        0.80 cm   LV e' lateral:   8.70 cm/s LVOT diam:     1.90 cm   LV E/e' lateral: 7.8 LV SV:         50 LV SV Index:   25 LVOT Area:     2.84 cm  RIGHT VENTRICLE RV Basal diam:  2.85 cm RV Mid diam:    2.80 cm RV S prime:     15.60 cm/s TAPSE (M-mode): 2.2 cm LEFT ATRIUM             Index        RIGHT ATRIUM           Index LA diam:        3.20 cm 1.62 cm/m   RA Area:     12.10 cm LA Vol (A2C):   64.3 ml 32.52 ml/m  RA Volume:   25.80 ml  13.05 ml/m LA Vol (A4C):   77.9 ml 39.40 ml/m LA Biplane Vol: 71.3 ml 36.06 ml/m  AORTIC VALVE                    PULMONIC VALVE AV Area (Vmax):    2.57 cm     PV Vmax:       0.63 m/s AV Area (Vmean):   2.44 cm     PV Peak grad:  1.6 mmHg AV Area (VTI):     2.51 cm AV Vmax:           88.80 cm/s AV Vmean:          61.300 cm/s AV VTI:             0.199 m AV Peak Grad:      3.2 mmHg AV Mean Grad:      2.0 mmHg LVOT Vmax:         80.60 cm/s LVOT Vmean:        52.700 cm/s LVOT VTI:          0.176 m LVOT/AV VTI ratio: 0.88  AORTA Ao Root diam: 3.10 cm MITRAL VALVE               TRICUSPID VALVE MV Area (PHT): 2.74 cm    TR Peak grad:   10.1 mmHg MV Area VTI:   1.68 cm    TR Vmax:        159.00 cm/s MV Peak grad:  3.0 mmHg MV Mean grad:  1.0 mmHg    SHUNTS MV Vmax:       0.87 m/s  Systemic VTI:  0.18 m MV Vmean:      38.9 cm/s   Systemic Diam: 1.90 cm MV Decel Time: 277 msec MV E velocity: 68.10 cm/s MV A velocity: 84.90 cm/s MV E/A ratio:  0.80 Yolonda Kida MD Electronically signed by Yolonda Kida MD Signature Date/Time: 10/02/2022/1:47:29 PM    Final    DG Chest Port 1 View  Result Date: 10/02/2022 CLINICAL DATA:  Palpitations EXAM: PORTABLE CHEST 1 VIEW COMPARISON:  None Available. FINDINGS: The heart size and mediastinal contours are within normal limits. Both lungs are clear. The visualized skeletal structures are unremarkable. IMPRESSION: No active disease. Electronically Signed   By: Placido Sou M.D.   On: 10/02/2022 02:46    Cardiac Studies   Cardiac Studies & Procedures   CARDIAC CATHETERIZATION  CARDIAC CATHETERIZATION 10/02/2022  Narrative   Ost LM to Mid LM lesion is 75% stenosed.   Ost RCA to Prox RCA lesion is 90% stenosed.   Dist LM to Ost LAD lesion is 100% stenosed.   Ost Cx to Dist Cx lesion is 50% stenosed.   Prox RCA to Mid RCA lesion is 50% stenosed.   The left ventricular systolic function is normal.   LV end diastolic pressure is normal.   The left ventricular ejection fraction is 50-55% by visual estimate.   There is no mitral valve regurgitation.  Conclusion Status post non-STEMI presentation  Left heart cath right radial approach  Left ventriculogram showed preserved left ventricular function EF of at least 55%  Coronaries Left main large ostial 75 hazy calcified lesion LAD 100% occluded  ostially TIMI I flow IRA Circumflex medium to small diffuse 50% RCA large 90% proximal 50% mid TIMI-3 flow Right dominant system Faint collaterals  Intervention deferred not indicated Recommend coronary bypass surgery at a tertiary care center  Findings Coronary Findings Diagnostic  Dominance: Right  Left Main Ost LM to Mid LM lesion is 75% stenosed. Vessel is not the culprit lesion. The lesion is type C, located proximal to the major branch, focal, discrete, eccentric, irregular and ulcerative. The lesion is moderately calcified. The lesion was not previously treated . The stenosis was measured by a visual reading. Pressure gradient was not performed on the lesion. IVUS was not performed. No optical coherence tomography (OCT) was performed. Dist LM to Ost LAD lesion is 100% stenosed. Vessel is the culprit lesion. The lesion is type C, located at the major branch, focal, discrete and irregular. The lesion is moderately calcified. The lesion was not previously treated . The stenosis was measured by a visual reading. Pressure gradient was not performed on the lesion. IVUS was not performed. No optical coherence tomography (OCT) was performed.  Left Circumflex Ost Cx to Dist Cx lesion is 50% stenosed. Vessel is not the culprit lesion. The lesion is type C, located at the major branch and concentric. The lesion is mildly calcified. The lesion was not previously treated . The stenosis was measured by a visual reading. Pressure gradient was not performed on the lesion. IVUS was not performed. No optical coherence tomography (OCT) was performed.  Right Coronary Artery Ost RCA to Prox RCA lesion is 90% stenosed. Vessel is not the culprit lesion. The lesion is type A, located at the major branch, focal, discrete and eccentric. The lesion is moderately calcified. The lesion was not previously treated . The stenosis was measured by a visual reading. Pressure gradient was not performed on the lesion. IVUS  was not performed. No optical  coherence tomography (OCT) was performed. Prox RCA to Mid RCA lesion is 50% stenosed. Vessel is not the culprit lesion. The lesion is type B1 and located at the major branch. The lesion was not previously treated . The stenosis was measured by a visual reading. Pressure gradient was not performed on the lesion. IVUS was not performed. No optical coherence tomography (OCT) was performed.  Intervention  No interventions have been documented.     ECHOCARDIOGRAM  ECHOCARDIOGRAM COMPLETE 10/02/2022  Narrative ECHOCARDIOGRAM REPORT    Patient Name:   Ruben [name] Date of Exam: 10/02/2022 Medical Rec #:  [id number]        Height:       68.0 in Accession #:    [id number]       Weight:       185.0 lb Date of Birth:  06-24-1946       BSA:          1.977 m Patient Age:    57 years         BP:           124/71 mmHg Patient Gender: M                HR:           56 bpm. Exam Location:  [facility]  Procedure: 2D Echo, Cardiac Doppler and Color Doppler  Indications:     NSTEMI  History:         Patient has prior history of Echocardiogram examinations, most recent 10/29/2020. Acute MI, TIA; Arrythmias:Tachycardia.  Sonographer:     Wenda Low Referring Phys:  [id number] [place] TANG Diagnosing Phys: Yolonda Kida MD   Sonographer Comments: Technically difficult study due to poor echo windows and suboptimal parasternal window. IMPRESSIONS   1. Left ventricular ejection fraction, by estimation, is 60 to 65%. The left ventricle has normal function. The left ventricle has no regional wall motion abnormalities. Left ventricular diastolic parameters are consistent with Grade I diastolic dysfunction (impaired relaxation). 2. Right ventricular systolic function is normal. The right ventricular size is normal. There is normal pulmonary artery systolic pressure. 3. The mitral valve is normal in structure. Trivial mitral valve regurgitation. 4. The aortic valve  is normal in structure. Aortic valve regurgitation is not visualized.  FINDINGS Left Ventricle: Left ventricular ejection fraction, by estimation, is 60 to 65%. The left ventricle has normal function. The left ventricle has no regional wall motion abnormalities. The left ventricular internal cavity size was normal in size. There is no left ventricular hypertrophy. Left ventricular diastolic parameters are consistent with Grade I diastolic dysfunction (impaired relaxation).  Right Ventricle: The right ventricular size is normal. No increase in right ventricular wall thickness. Right ventricular systolic function is normal. There is normal pulmonary artery systolic pressure. The tricuspid regurgitant velocity is 1.59 m/s, and with an assumed right atrial pressure of 3 mmHg, the estimated right ventricular systolic pressure is [id number] mmHg.  Left Atrium: Left atrial size was normal in size.  Right Atrium: Right atrial size was normal in size.  Pericardium: There is no evidence of pericardial effusion.  Mitral Valve: The mitral valve is normal in structure. There is moderate thickening of the mitral valve leaflet(s). Normal mobility of the mitral valve leaflets. Trivial mitral valve regurgitation. MV peak gradient, 3.0 mmHg. The mean mitral valve gradient is 1.0 mmHg.  Tricuspid Valve: The tricuspid valve is normal in structure. Tricuspid valve regurgitation is trivial.  Aortic Valve: The aortic valve  is normal in structure. Aortic valve regurgitation is not visualized. Aortic valve mean gradient measures 2.0 mmHg. Aortic valve peak gradient measures 3.2 mmHg. Aortic valve area, by VTI measures 2.51 cm.  Pulmonic Valve: The pulmonic valve was normal in structure. Pulmonic valve regurgitation is not visualized.  Aorta: The ascending aorta was not well visualized.  IAS/Shunts: No atrial level shunt detected by color flow Doppler.   LEFT VENTRICLE PLAX 2D LVIDd:         4.30 cm    Diastology LVIDs:         2.00 cm   LV e' medial:    7.18 cm/s LV PW:         1.00 cm   LV E/e' medial:  9.5 LV IVS:        0.80 cm   LV e' lateral:   8.70 cm/s LVOT diam:     1.90 cm   LV E/e' lateral: 7.8 LV SV:         50 LV SV Index:   25 LVOT Area:     2.84 cm   RIGHT VENTRICLE RV Basal diam:  2.85 cm RV Mid diam:    2.80 cm RV S prime:     15.60 cm/s TAPSE (M-mode): 2.2 cm  LEFT ATRIUM             Index        RIGHT ATRIUM           Index LA diam:        3.20 cm 1.62 cm/m   RA Area:     12.10 cm LA Vol (A2C):   64.3 ml 32.52 ml/m  RA Volume:   25.80 ml  13.05 ml/m LA Vol (A4C):   77.9 ml 39.40 ml/m LA Biplane Vol: 71.3 ml 36.06 ml/m AORTIC VALVE                    PULMONIC VALVE AV Area (Vmax):    2.57 cm     PV Vmax:       0.63 m/s AV Area (Vmean):   2.44 cm     PV Peak grad:  1.6 mmHg AV Area (VTI):     2.51 cm AV Vmax:           88.80 cm/s AV Vmean:          61.300 cm/s AV VTI:            0.199 m AV Peak Grad:      3.2 mmHg AV Mean Grad:      2.0 mmHg LVOT Vmax:         80.60 cm/s LVOT Vmean:        52.700 cm/s LVOT VTI:          0.176 m LVOT/AV VTI ratio: 0.88  AORTA Ao Root diam: 3.10 cm  MITRAL VALVE               TRICUSPID VALVE MV Area (PHT): 2.74 cm    TR Peak grad:   10.1 mmHg MV Area VTI:   1.68 cm    TR Vmax:        159.00 cm/s MV Peak grad:  3.0 mmHg MV Mean grad:  1.0 mmHg    SHUNTS MV Vmax:       0.87 m/s    Systemic VTI:  0.18 m MV Vmean:      38.9 cm/s   Systemic Diam: 1.90 cm MV Decel Time:  277 msec MV E velocity: 68.10 cm/s MV A velocity: 84.90 cm/s MV E/A ratio:  0.80  Dwayne D Callwood MD Electronically signed by Yolonda Kida MD Signature Date/Time: 10/02/2022/1:47:29 PM    Final              Patient Profile     77 y.o. male transferred from [place] with non-ST elevation myocardial infarction and diagnostic cardiac catheterization revealing severe multivessel coronary artery disease here for CABG.  Assessment  & Plan    Non-ST elevation myocardial infarction Severe multivessel coronary artery disease - Cardiac catheterization personally reviewed.  Performed by Dr. Clayborn Bigness at [facility].  Severe left main disease noted.  Echo with normal EF.  No significant valvular disease.  EKG originally with progressive T wave inversions V1 through V3.  Troponin 6500.  Discussed case with Dr. Coralie Common of cardiothoracic surgery.  Transferred here for bypass surgery. -Continue with heparin IV drip.  Statin, aspirin.  He is not on Eliquis. -I will add low-dose metoprolol 12.5 mg twice a day.  Blood pressure 103/45 last check.  As high as 135/58 however.  Hyperlipidemia - Crestor previous on 5 mg, will change to 20 mg..  LDL goal less than 55.  Currently 144.  Paroxysmal atrial fibrillation - In the past has declined anticoagulation.  Had a history of GI bleed in the setting of Aleve.  History of TIA - September 2022.  Event monitor 30-day monitor discovered paroxysmal atrial fibrillation.  At the time according to history and physical he felt that he did not have sufficient evidence documenting his atrial fibrillation warrant further treatment. -10/28/2020-mild carotid plaque bilaterally without significant stenosis.    For questions or updates, please contact [place] Please consult [email or web address] for contact info under        Signed, Candee Furbish, MD  10/03/2022, 7:57 AM

## 2022-10-03 NOTE — Progress Notes (Signed)
ANTICOAGULATION CONSULT NOTE - Initial Consult  Pharmacy Consult for heparin Indication:  CAD awaiting CVTS consult  Allergies  Allergen Reactions   Codeine Nausea Only   Vital Signs: Temp: 98.6 F (37 C) (03/16 0016) Temp Source: Oral (03/16 0016) BP: 126/53 (03/16 0016) Pulse Rate: 56 (03/16 0016)  Labs: Recent Labs    10/02/22 0234 10/02/22 0429 10/02/22 1043 10/02/22 1215  HGB 13.0  --   --   --   HCT 38.6*  --   --   --   PLT 177  --   --   --   APTT 34  --   --   --   LABPROT 13.1  --   --   --   INR 1.0  --   --   --   HEPARINUNFRC  --   --  0.66  --   CREATININE 0.96  --   --   --   TROPONINIHS 187* 1,346* 6,534* 6,270*    Estimated Creatinine Clearance: 52.9 mL/min (by C-G formula based on SCr of 0.96 mg/dL).   Medical History: Past Medical History:  Diagnosis Date   TIA (transient ischemic attack)     Assessment: 77yo male tx'd from [facility] after cardiac cath revealed CAD, to continue heparin.  Goal of Therapy:  Heparin level 0.3-0.7 units/ml Monitor platelets by anticoagulation protocol: Yes   Plan:  Continue heparin infusion at 1100 units/hr as transferred on. Monitor heparin levels and CBC.  Wynona Neat, PharmD, BCPS  10/03/2022,2:50 AM

## 2022-10-04 ENCOUNTER — Inpatient Hospital Stay (HOSPITAL_COMMUNITY): Payer: PPO

## 2022-10-04 ENCOUNTER — Encounter: Payer: Self-pay | Admitting: Internal Medicine

## 2022-10-04 DIAGNOSIS — Z1152 Encounter for screening for COVID-19: Secondary | ICD-10-CM | POA: Diagnosis not present

## 2022-10-04 DIAGNOSIS — I25110 Atherosclerotic heart disease of native coronary artery with unstable angina pectoris: Secondary | ICD-10-CM | POA: Diagnosis not present

## 2022-10-04 DIAGNOSIS — I214 Non-ST elevation (NSTEMI) myocardial infarction: Secondary | ICD-10-CM | POA: Diagnosis not present

## 2022-10-04 DIAGNOSIS — D696 Thrombocytopenia, unspecified: Secondary | ICD-10-CM | POA: Diagnosis not present

## 2022-10-04 LAB — URINALYSIS, ROUTINE W REFLEX MICROSCOPIC
Bilirubin Urine: NEGATIVE
Glucose, UA: NEGATIVE mg/dL
Ketones, ur: NEGATIVE mg/dL
Leukocytes,Ua: NEGATIVE
Nitrite: NEGATIVE
Protein, ur: NEGATIVE mg/dL
Specific Gravity, Urine: 1.01 (ref 1.005–1.030)
pH: 7 (ref 5.0–8.0)

## 2022-10-04 LAB — CBC
HCT: 35.4 % — ABNORMAL LOW (ref 39.0–52.0)
Hemoglobin: 12.1 g/dL — ABNORMAL LOW (ref 13.0–17.0)
MCH: 35.6 pg — ABNORMAL HIGH (ref 26.0–34.0)
MCHC: 34.2 g/dL (ref 30.0–36.0)
MCV: 104.1 fL — ABNORMAL HIGH (ref 80.0–100.0)
Platelets: 154 10*3/uL (ref 150–400)
RBC: 3.4 MIL/uL — ABNORMAL LOW (ref 4.22–5.81)
RDW: 12.2 % (ref 11.5–15.5)
WBC: 4.6 10*3/uL (ref 4.0–10.5)
nRBC: 0 % (ref 0.0–0.2)

## 2022-10-04 LAB — BLOOD GAS, ARTERIAL
Acid-Base Excess: 1.5 mmol/L (ref 0.0–2.0)
Bicarbonate: 25.9 mmol/L (ref 20.0–28.0)
Drawn by: 59094
O2 Saturation: 98.1 %
Patient temperature: 37.2
pCO2 arterial: 39 mmHg (ref 32–48)
pH, Arterial: 7.43 (ref 7.35–7.45)
pO2, Arterial: 81 mmHg — ABNORMAL LOW (ref 83–108)

## 2022-10-04 LAB — ABO/RH: ABO/RH(D): O NEG

## 2022-10-04 LAB — SARS CORONAVIRUS 2 BY RT PCR: SARS Coronavirus 2 by RT PCR: NEGATIVE

## 2022-10-04 LAB — HEPARIN LEVEL (UNFRACTIONATED): Heparin Unfractionated: 0.63 IU/mL (ref 0.30–0.70)

## 2022-10-04 MED ORDER — VANCOMYCIN HCL 1250 MG/250ML IV SOLN
1250.0000 mg | INTRAVENOUS | Status: AC
  Administered 2022-10-05: 1250 mg via INTRAVENOUS
  Filled 2022-10-04: qty 250

## 2022-10-04 MED ORDER — MILRINONE LACTATE IN DEXTROSE 20-5 MG/100ML-% IV SOLN
0.3000 ug/kg/min | INTRAVENOUS | Status: DC
  Filled 2022-10-04: qty 100

## 2022-10-04 MED ORDER — CEFAZOLIN SODIUM-DEXTROSE 2-4 GM/100ML-% IV SOLN
2.0000 g | INTRAVENOUS | Status: AC
  Administered 2022-10-05: 2 g via INTRAVENOUS
  Filled 2022-10-04: qty 100

## 2022-10-04 MED ORDER — MAGNESIUM SULFATE 50 % IJ SOLN
40.0000 meq | INTRAMUSCULAR | Status: DC
  Filled 2022-10-04: qty 9.85

## 2022-10-04 MED ORDER — BISACODYL 5 MG PO TBEC: 5.0000 mg | DELAYED_RELEASE_TABLET | Freq: Once | ORAL | Status: DC

## 2022-10-04 MED ORDER — EPINEPHRINE HCL 5 MG/250ML IV SOLN IN NS
0.0000 ug/min | INTRAVENOUS | Status: DC
  Filled 2022-10-04: qty 250

## 2022-10-04 MED ORDER — POTASSIUM CHLORIDE 2 MEQ/ML IV SOLN
80.0000 meq | INTRAVENOUS | Status: DC
  Filled 2022-10-04: qty 40

## 2022-10-04 MED ORDER — DEXMEDETOMIDINE HCL IN NACL 400 MCG/100ML IV SOLN
0.1000 ug/kg/h | INTRAVENOUS | Status: AC
  Administered 2022-10-05: .7 ug/kg/h via INTRAVENOUS
  Filled 2022-10-04: qty 100

## 2022-10-04 MED ORDER — NITROGLYCERIN IN D5W 200-5 MCG/ML-% IV SOLN
2.0000 ug/min | INTRAVENOUS | Status: DC
  Filled 2022-10-04: qty 250

## 2022-10-04 MED ORDER — CHLORHEXIDINE GLUCONATE CLOTH 2 % EX PADS
6.0000 | MEDICATED_PAD | Freq: Once | CUTANEOUS | Status: AC
  Administered 2022-10-04: 6 via TOPICAL

## 2022-10-04 MED ORDER — PHENYLEPHRINE HCL-NACL 20-0.9 MG/250ML-% IV SOLN
30.0000 ug/min | INTRAVENOUS | Status: AC
  Administered 2022-10-05: 50 ug/min via INTRAVENOUS
  Filled 2022-10-04: qty 250

## 2022-10-04 MED ORDER — PLASMA-LYTE A IV SOLN
INTRAVENOUS | Status: DC
  Filled 2022-10-04: qty 2.5

## 2022-10-04 MED ORDER — TRANEXAMIC ACID 1000 MG/10ML IV SOLN
1.5000 mg/kg/h | INTRAVENOUS | Status: AC
  Administered 2022-10-05: 1.5 mg/kg/h via INTRAVENOUS
  Filled 2022-10-04: qty 25

## 2022-10-04 MED ORDER — TEMAZEPAM 15 MG PO CAPS: 15.0000 mg | ORAL_CAPSULE | Freq: Once | ORAL | Status: DC | PRN

## 2022-10-04 MED ORDER — NOREPINEPHRINE 4 MG/250ML-% IV SOLN
0.0000 ug/min | INTRAVENOUS | Status: DC
  Filled 2022-10-04: qty 250

## 2022-10-04 MED ORDER — INSULIN REGULAR(HUMAN) IN NACL 100-0.9 UT/100ML-% IV SOLN
INTRAVENOUS | Status: AC
  Administered 2022-10-05: .7 [IU]/h via INTRAVENOUS
  Filled 2022-10-04: qty 100

## 2022-10-04 MED ORDER — TRANEXAMIC ACID (OHS) PUMP PRIME SOLUTION
2.0000 mg/kg | INTRAVENOUS | Status: DC
  Filled 2022-10-04: qty 1.12

## 2022-10-04 MED ORDER — TRANEXAMIC ACID (OHS) BOLUS VIA INFUSION
15.0000 mg/kg | INTRAVENOUS | Status: AC
  Administered 2022-10-05: 843 mg via INTRAVENOUS
  Filled 2022-10-04: qty 843

## 2022-10-04 MED ORDER — HEPARIN 30,000 UNITS/1000 ML (OHS) CELLSAVER SOLUTION
Status: DC
  Filled 2022-10-04: qty 1000

## 2022-10-04 MED ORDER — CHLORHEXIDINE GLUCONATE 0.12 % MT SOLN
15.0000 mL | Freq: Once | OROMUCOSAL | Status: AC
  Administered 2022-10-05: 15 mL via OROMUCOSAL
  Filled 2022-10-04: qty 15

## 2022-10-04 NOTE — Progress Notes (Signed)
Rounding Note    Patient Name: Ruben [name] Date of Encounter: 10/04/2022  [facility] Cardiologist:  Dr. Saralyn Pilar  Subjective   Currently doing well without any complaints.  Appreciate cardiothoracic surgery consultation  Retired Optometrist.  Spent the last 6 years eating fruits vegetables no sugar.  Father had bypass surgery in his late 66s.  Inpatient Medications    Scheduled Meds:  aspirin EC  81 mg Oral Daily   metoprolol tartrate  12.5 mg Oral BID   pantoprazole  40 mg Oral Daily   rosuvastatin  20 mg Oral Daily   Continuous Infusions:  heparin 1,100 Units/hr (10/04/22 0604)   PRN Meds: acetaminophen, nitroGLYCERIN, ondansetron (ZOFRAN) IV   Vital Signs    Vitals:   10/03/22 1942 10/03/22 2200 10/04/22 0258 10/04/22 0758  BP: (!) 131/35 (!) 127/53 128/70 131/68  Pulse: 61 60 60 61  Resp: 20 18 15  (!) 22  Temp: 98.9 F (37.2 C) 98.9 F (37.2 C) 98.9 F (37.2 C) 98.9 F (37.2 C)  TempSrc: Oral Oral Oral Oral  SpO2: 96% 97% 97% 98%    Intake/Output Summary (Last 24 hours) at 10/04/2022 0837 Last data filed at 10/03/2022 1200 Gross per 24 hour  Intake 120 ml  Output --  Net 120 ml      10/02/2022   12:19 PM 10/02/2022    2:22 AM 10/28/2020    8:31 PM  Last 3 Weights  Weight (lbs) 123 lb 14.4 oz 185 lb 141 lb 15.6 oz  Weight (kg) 56.201 kg 83.915 kg 64.4 kg      Telemetry    Normal sinus rhythm, brief SVT 70s heart rate 128.  No symptoms.- Personally Reviewed  ECG    As described below, sinus bradycardia, T wave inversions noted precordial leads- Personally Reviewed  Physical Exam   GEN: No acute distress.  Thin Neck: No JVD, soft left bruit Cardiac: RRR, no murmurs, rubs, or gallops.  Respiratory: Clear to auscultation bilaterally. GI: Soft, nontender, non-distended  MS: No edema; No deformity. Neuro:  Nonfocal  Psych: Normal affect  Unchanged physical exam from yesterday  Labs    High Sensitivity Troponin:   Recent  Labs  Lab 10/02/22 0234 10/02/22 0429 10/02/22 1043 10/02/22 1215  TROPONINIHS 187* 1,346* 6,534* 6,270*     Chemistry Recent Labs  Lab 10/02/22 0234 10/03/22 0545  NA 140 140  K 3.6 3.7  CL 104 108  CO2 25 23  GLUCOSE 104* 82  BUN 13 16  CREATININE 0.96 1.11  CALCIUM 8.7* 8.6*  PROT  --  4.9*  ALBUMIN  --  3.1*  AST  --  36  ALT  --  18  ALKPHOS  --  12*  BILITOT  --  1.8*  GFRNONAA >60 >60  ANIONGAP 11 9    Lipids No results for input(s): "CHOL", "TRIG", "HDL", "LABVLDL", "LDLCALC", "CHOLHDL" in the last 168 hours.  Hematology Recent Labs  Lab 10/02/22 0234 10/03/22 0545 10/04/22 0016  WBC 4.7 4.5 4.6  RBC 3.70* 3.09* 3.40*  HGB 13.0 11.3* 12.1*  HCT 38.6* 31.7* 35.4*  MCV 104.3* 102.6* 104.1*  MCH 35.1* 36.6* 35.6*  MCHC 33.7 35.6 34.2  RDW 11.8 12.2 12.2  PLT 177 149* 154   Thyroid No results for input(s): "TSH", "FREET4" in the last 168 hours.  BNPNo results for input(s): "BNP", "PROBNP" in the last 168 hours.  DDimer No results for input(s): "DDIMER" in the last 168 hours.   Radiology  CT CHEST WO CONTRAST  Result Date: 10/04/2022 CLINICAL DATA:  Status post none ST elevation myocardial infarction. Severe multi-vessel coronary artery disease. Hyperlipidemia. EXAM: CT CHEST WITHOUT CONTRAST TECHNIQUE: Multidetector CT imaging of the chest was performed following the standard protocol without IV contrast. RADIATION DOSE REDUCTION: This exam was performed according to the departmental dose-optimization program which includes automated exposure control, adjustment of the mA and/or kV according to patient size and/or use of iterative reconstruction technique. COMPARISON:  None Available. FINDINGS: Cardiovascular: Heart size appears normal. Aortic atherosclerosis and 3 vessel coronary artery calcifications. No pericardial effusion. Mediastinum/Nodes: No enlarged mediastinal or axillary lymph nodes. Thyroid gland, trachea, and esophagus demonstrate no  significant findings. Lungs/Pleura: No pleural effusion, airspace consolidation, atelectasis, or pneumothorax. Scarring noted within the posterior lung bases. No suspicious pulmonary nodule or mass. Upper Abdomen: No acute abnormality. 1.3 cm ill-defined low-density structure is noted within the posterior right lobe of liver, image 413/4. Gallstones identified use left 10 the jaw all stable once the gains with lost Musculoskeletal: No chest wall mass or suspicious bone lesions identified. IMPRESSION: 1. No acute cardiopulmonary abnormalities. 2. Coronary artery calcifications. 3. Gallstones. 4. 1.3 cm ill-defined low-density structure is noted within the posterior right lobe of liver. This is incompletely characterized on this exam. In the absence of a known malignancy this is likely benign. Consider follow-up imaging with nonemergent contrast enhanced outpatient liver MRI for more definitive characterization could. 5.  Aortic Atherosclerosis (ICD10-I70.0). Electronically Signed   By: Kerby Moors M.D.   On: 10/04/2022 07:56   VAS US CAROTID  Result Date: 10/03/2022 Carotid Arterial Duplex Study Patient Name:  Ruben [name]  Date of Exam:   10/03/2022 Medical Rec #: [id number]         Accession #:    [id number] Date of Birth: 1946/02/09        Patient Gender: M Patient Age:   77 years Exam Location:  [facility] Procedure:      VAS US CAROTID Referring Phys: Lars Pinks --------------------------------------------------------------------------------  Indications:       TIA and pre-op. Risk Factors:      Hypertension, no history of smoking, coronary artery                    disease. Comparison Study:  Previous 10/28/20 Performing Technologist: McKayla Maag RVT, [place]  Examination Guidelines: A complete evaluation includes B-mode imaging, spectral Doppler, color Doppler, and power Doppler as needed of all accessible portions of each vessel. Bilateral testing is considered an integral part of a  complete examination. Limited examinations for reoccurring indications may be performed as noted.  Right Carotid Findings: +---------+--------+-------+--------+------------------------+-----------------+          PSV cm/sEDV    StenosisPlaque Description      Comments                           cm/s                                                     +---------+--------+-------+--------+------------------------+-----------------+ CCA Prox 64      10     <50%    heterogenous and  irregular                                 +---------+--------+-------+--------+------------------------+-----------------+ CCA      67      11                                     intimal           Distal                                                  thickening        +---------+--------+-------+--------+------------------------+-----------------+ ICA Prox 66      12     1-39%   heterogenous and                                                          irregular                                 +---------+--------+-------+--------+------------------------+-----------------+ ICA Mid  78      15                                                       +---------+--------+-------+--------+------------------------+-----------------+ ICA      107     25                                                       Distal                                                                    +---------+--------+-------+--------+------------------------+-----------------+ ECA      94      10                                                       +---------+--------+-------+--------+------------------------+-----------------+ +----------+--------+-------+----------------+-------------------+           PSV cm/sEDV cmsDescribe        Arm Pressure (mmHG) +----------+--------+-------+----------------+-------------------+  [place]:[id number]            Multiphasic, WNL                    +----------+--------+-------+----------------+-------------------+ +---------+--------+--+--------+-+---------+ VertebralPSV cm/s34EDV cm/s8Antegrade +---------+--------+--+--------+-+---------+  Left Carotid Findings: +---------+--------+-------+--------+------------------------+-----------------+  PSV cm/sEDV    StenosisPlaque Description      Comments                           cm/s                                                     +---------+--------+-------+--------+------------------------+-----------------+ CCA Prox 71      10                                     intimal                                                                   thickening        +---------+--------+-------+--------+------------------------+-----------------+ CCA      76      11                                     intimal           Distal                                                  thickening        +---------+--------+-------+--------+------------------------+-----------------+ ICA Prox 48      7      1-39%   heterogenous and                                                          irregular                                 +---------+--------+-------+--------+------------------------+-----------------+ ICA Mid  48      8                                                        +---------+--------+-------+--------+------------------------+-----------------+ ICA      86      27                                                       Distal                                                                    +---------+--------+-------+--------+------------------------+-----------------+  ECA      82      8                                                        +---------+--------+-------+--------+------------------------+-----------------+  +----------+--------+--------+----------------+-------------------+           PSV cm/sEDV cm/sDescribe        Arm Pressure (mmHG) +----------+--------+--------+----------------+-------------------+ Subclavian140             Multiphasic, WNL                    +----------+--------+--------+----------------+-------------------+ +---------+--------+--+--------+--+---------+ VertebralPSV cm/s70EDV cm/s14Antegrade +---------+--------+--+--------+--+---------+   Summary: Right Carotid: Velocities in the right ICA are consistent with a 1-39% stenosis.                Non-hemodynamically significant plaque <50% noted in the CCA. Left Carotid: Velocities in the left ICA are consistent with a 1-39% stenosis. Vertebrals:  Bilateral vertebral arteries demonstrate antegrade flow. Subclavians: Normal flow hemodynamics were seen in bilateral subclavian              arteries. *See table(s) above for measurements and observations.     Preliminary    CARDIAC CATHETERIZATION  Result Date: 10/02/2022   Ost LM to Mid LM lesion is 75% stenosed.   Ost RCA to Prox RCA lesion is 90% stenosed.   Dist LM to Ost LAD lesion is 100% stenosed.   Ost Cx to Dist Cx lesion is 50% stenosed.   Prox RCA to Mid RCA lesion is 50% stenosed.   The left ventricular systolic function is normal.   LV end diastolic pressure is normal.   The left ventricular ejection fraction is 50-55% by visual estimate.   There is no mitral valve regurgitation. Conclusion Status post non-STEMI presentation Left heart cath right radial approach Left ventriculogram showed preserved left ventricular function EF of at least 55% Coronaries Left main large ostial 75 hazy calcified lesion LAD 100% occluded ostially TIMI I flow IRA Circumflex medium to small diffuse 50% RCA large 90% proximal 50% mid TIMI-3 flow Right dominant system Faint collaterals Intervention deferred not indicated Recommend coronary bypass surgery at a tertiary care  center  ECHOCARDIOGRAM COMPLETE  Result Date: 10/02/2022    ECHOCARDIOGRAM REPORT   Patient Name:   Ruben [name] Date of Exam: 10/02/2022 Medical Rec #:  [id number]        Height:       68.0 in Accession #:    [id number]       Weight:       185.0 lb Date of Birth:  08-Jul-1946       BSA:          1.977 m Patient Age:    61 years         BP:           124/71 mmHg Patient Gender: M                HR:           56 bpm. Exam Location:  [facility] Procedure: 2D Echo, Cardiac Doppler and Color Doppler Indications:     NSTEMI  History:         Patient has prior history of Echocardiogram examinations, most  recent 10/29/2020. Acute MI, TIA; Arrythmias:Tachycardia.  Sonographer:     Wenda Low Referring Phys:  [id number] [place] TANG Diagnosing Phys: Yolonda Kida MD  Sonographer Comments: Technically difficult study due to poor echo windows and suboptimal parasternal window. IMPRESSIONS  1. Left ventricular ejection fraction, by estimation, is 60 to 65%. The left ventricle has normal function. The left ventricle has no regional wall motion abnormalities. Left ventricular diastolic parameters are consistent with Grade I diastolic dysfunction (impaired relaxation).  2. Right ventricular systolic function is normal. The right ventricular size is normal. There is normal pulmonary artery systolic pressure.  3. The mitral valve is normal in structure. Trivial mitral valve regurgitation.  4. The aortic valve is normal in structure. Aortic valve regurgitation is not visualized. FINDINGS  Left Ventricle: Left ventricular ejection fraction, by estimation, is 60 to 65%. The left ventricle has normal function. The left ventricle has no regional wall motion abnormalities. The left ventricular internal cavity size was normal in size. There is  no left ventricular hypertrophy. Left ventricular diastolic parameters are consistent with Grade I diastolic dysfunction (impaired relaxation). Right Ventricle: The  right ventricular size is normal. No increase in right ventricular wall thickness. Right ventricular systolic function is normal. There is normal pulmonary artery systolic pressure. The tricuspid regurgitant velocity is 1.59 m/s, and  with an assumed right atrial pressure of 3 mmHg, the estimated right ventricular systolic pressure is [id number] mmHg. Left Atrium: Left atrial size was normal in size. Right Atrium: Right atrial size was normal in size. Pericardium: There is no evidence of pericardial effusion. Mitral Valve: The mitral valve is normal in structure. There is moderate thickening of the mitral valve leaflet(s). Normal mobility of the mitral valve leaflets. Trivial mitral valve regurgitation. MV peak gradient, 3.0 mmHg. The mean mitral valve gradient is 1.0 mmHg. Tricuspid Valve: The tricuspid valve is normal in structure. Tricuspid valve regurgitation is trivial. Aortic Valve: The aortic valve is normal in structure. Aortic valve regurgitation is not visualized. Aortic valve mean gradient measures 2.0 mmHg. Aortic valve peak gradient measures 3.2 mmHg. Aortic valve area, by VTI measures 2.51 cm. Pulmonic Valve: The pulmonic valve was normal in structure. Pulmonic valve regurgitation is not visualized. Aorta: The ascending aorta was not well visualized. IAS/Shunts: No atrial level shunt detected by color flow Doppler.  LEFT VENTRICLE PLAX 2D LVIDd:         4.30 cm   Diastology LVIDs:         2.00 cm   LV e' medial:    7.18 cm/s LV PW:         1.00 cm   LV E/e' medial:  9.5 LV IVS:        0.80 cm   LV e' lateral:   8.70 cm/s LVOT diam:     1.90 cm   LV E/e' lateral: 7.8 LV SV:         50 LV SV Index:   25 LVOT Area:     2.84 cm  RIGHT VENTRICLE RV Basal diam:  2.85 cm RV Mid diam:    2.80 cm RV S prime:     15.60 cm/s TAPSE (M-mode): 2.2 cm LEFT ATRIUM             Index        RIGHT ATRIUM           Index LA diam:        3.20 cm 1.62 cm/m   RA Area:  12.10 cm LA Vol (A2C):   64.3 ml 32.52 ml/m  RA  Volume:   25.80 ml  13.05 ml/m LA Vol (A4C):   77.9 ml 39.40 ml/m LA Biplane Vol: 71.3 ml 36.06 ml/m  AORTIC VALVE                    PULMONIC VALVE AV Area (Vmax):    2.57 cm     PV Vmax:       0.63 m/s AV Area (Vmean):   2.44 cm     PV Peak grad:  1.6 mmHg AV Area (VTI):     2.51 cm AV Vmax:           88.80 cm/s AV Vmean:          61.300 cm/s AV VTI:            0.199 m AV Peak Grad:      3.2 mmHg AV Mean Grad:      2.0 mmHg LVOT Vmax:         80.60 cm/s LVOT Vmean:        52.700 cm/s LVOT VTI:          0.176 m LVOT/AV VTI ratio: 0.88  AORTA Ao Root diam: 3.10 cm MITRAL VALVE               TRICUSPID VALVE MV Area (PHT): 2.74 cm    TR Peak grad:   10.1 mmHg MV Area VTI:   1.68 cm    TR Vmax:        159.00 cm/s MV Peak grad:  3.0 mmHg MV Mean grad:  1.0 mmHg    SHUNTS MV Vmax:       0.87 m/s    Systemic VTI:  0.18 m MV Vmean:      38.9 cm/s   Systemic Diam: 1.90 cm MV Decel Time: 277 msec MV E velocity: 68.10 cm/s MV A velocity: 84.90 cm/s MV E/A ratio:  0.80 Dwayne D Callwood MD Electronically signed by Yolonda Kida MD Signature Date/Time: 10/02/2022/1:47:29 PM    Final     Cardiac Studies   Cardiac Studies & Procedures   CARDIAC CATHETERIZATION  CARDIAC CATHETERIZATION 10/02/2022  Narrative   Ost LM to Mid LM lesion is 75% stenosed.   Ost RCA to Prox RCA lesion is 90% stenosed.   Dist LM to Ost LAD lesion is 100% stenosed.   Ost Cx to Dist Cx lesion is 50% stenosed.   Prox RCA to Mid RCA lesion is 50% stenosed.   The left ventricular systolic function is normal.   LV end diastolic pressure is normal.   The left ventricular ejection fraction is 50-55% by visual estimate.   There is no mitral valve regurgitation.  Conclusion Status post non-STEMI presentation  Left heart cath right radial approach  Left ventriculogram showed preserved left ventricular function EF of at least 55%  Coronaries Left main large ostial 75 hazy calcified lesion LAD 100% occluded ostially TIMI I flow  IRA Circumflex medium to small diffuse 50% RCA large 90% proximal 50% mid TIMI-3 flow Right dominant system Faint collaterals  Intervention deferred not indicated Recommend coronary bypass surgery at a tertiary care center  Findings Coronary Findings Diagnostic  Dominance: Right  Left Main Ost LM to Mid LM lesion is 75% stenosed. Vessel is not the culprit lesion. The lesion is type C, located proximal to the major branch, focal, discrete, eccentric, irregular and ulcerative. The lesion is moderately  calcified. The lesion was not previously treated . The stenosis was measured by a visual reading. Pressure gradient was not performed on the lesion. IVUS was not performed. No optical coherence tomography (OCT) was performed. Dist LM to Ost LAD lesion is 100% stenosed. Vessel is the culprit lesion. The lesion is type C, located at the major branch, focal, discrete and irregular. The lesion is moderately calcified. The lesion was not previously treated . The stenosis was measured by a visual reading. Pressure gradient was not performed on the lesion. IVUS was not performed. No optical coherence tomography (OCT) was performed.  Left Circumflex Ost Cx to Dist Cx lesion is 50% stenosed. Vessel is not the culprit lesion. The lesion is type C, located at the major branch and concentric. The lesion is mildly calcified. The lesion was not previously treated . The stenosis was measured by a visual reading. Pressure gradient was not performed on the lesion. IVUS was not performed. No optical coherence tomography (OCT) was performed.  Right Coronary Artery Ost RCA to Prox RCA lesion is 90% stenosed. Vessel is not the culprit lesion. The lesion is type A, located at the major branch, focal, discrete and eccentric. The lesion is moderately calcified. The lesion was not previously treated . The stenosis was measured by a visual reading. Pressure gradient was not performed on the lesion. IVUS was not performed. No  optical coherence tomography (OCT) was performed. Prox RCA to Mid RCA lesion is 50% stenosed. Vessel is not the culprit lesion. The lesion is type B1 and located at the major branch. The lesion was not previously treated . The stenosis was measured by a visual reading. Pressure gradient was not performed on the lesion. IVUS was not performed. No optical coherence tomography (OCT) was performed.  Intervention  No interventions have been documented.     ECHOCARDIOGRAM  ECHOCARDIOGRAM COMPLETE 10/02/2022  Narrative ECHOCARDIOGRAM REPORT    Patient Name:   DONNELL BURDON Date of Exam: 10/02/2022 Medical Rec #:  [id number]        Height:       68.0 in Accession #:    [id number]       Weight:       185.0 lb Date of Birth:  [birth year]       BSA:          1.977 m Patient Age:    26 years         BP:           124/71 mmHg Patient Gender: M                HR:           56 bpm. Exam Location:  [facility]  Procedure: 2D Echo, Cardiac Doppler and Color Doppler  Indications:     NSTEMI  History:         Patient has prior history of Echocardiogram examinations, most recent 10/29/2020. Acute MI, TIA; Arrythmias:Tachycardia.  Sonographer:     Wenda Low Referring Phys:  [id number] [place] TANG Diagnosing Phys: Yolonda Kida MD   Sonographer Comments: Technically difficult study due to poor echo windows and suboptimal parasternal window. IMPRESSIONS   1. Left ventricular ejection fraction, by estimation, is 60 to 65%. The left ventricle has normal function. The left ventricle has no regional wall motion abnormalities. Left ventricular diastolic parameters are consistent with Grade I diastolic dysfunction (impaired relaxation). 2. Right ventricular systolic function is normal. The right ventricular size is normal. There  is normal pulmonary artery systolic pressure. 3. The mitral valve is normal in structure. Trivial mitral valve regurgitation. 4. The aortic valve is normal in  structure. Aortic valve regurgitation is not visualized.  FINDINGS Left Ventricle: Left ventricular ejection fraction, by estimation, is 60 to 65%. The left ventricle has normal function. The left ventricle has no regional wall motion abnormalities. The left ventricular internal cavity size was normal in size. There is no left ventricular hypertrophy. Left ventricular diastolic parameters are consistent with Grade I diastolic dysfunction (impaired relaxation).  Right Ventricle: The right ventricular size is normal. No increase in right ventricular wall thickness. Right ventricular systolic function is normal. There is normal pulmonary artery systolic pressure. The tricuspid regurgitant velocity is 1.59 m/s, and with an assumed right atrial pressure of 3 mmHg, the estimated right ventricular systolic pressure is [id number] mmHg.  Left Atrium: Left atrial size was normal in size.  Right Atrium: Right atrial size was normal in size.  Pericardium: There is no evidence of pericardial effusion.  Mitral Valve: The mitral valve is normal in structure. There is moderate thickening of the mitral valve leaflet(s). Normal mobility of the mitral valve leaflets. Trivial mitral valve regurgitation. MV peak gradient, 3.0 mmHg. The mean mitral valve gradient is 1.0 mmHg.  Tricuspid Valve: The tricuspid valve is normal in structure. Tricuspid valve regurgitation is trivial.  Aortic Valve: The aortic valve is normal in structure. Aortic valve regurgitation is not visualized. Aortic valve mean gradient measures 2.0 mmHg. Aortic valve peak gradient measures 3.2 mmHg. Aortic valve area, by VTI measures 2.51 cm.  Pulmonic Valve: The pulmonic valve was normal in structure. Pulmonic valve regurgitation is not visualized.  Aorta: The ascending aorta was not well visualized.  IAS/Shunts: No atrial level shunt detected by color flow Doppler.   LEFT VENTRICLE PLAX 2D LVIDd:         4.30 cm   Diastology LVIDs:          2.00 cm   LV e' medial:    7.18 cm/s LV PW:         1.00 cm   LV E/e' medial:  9.5 LV IVS:        0.80 cm   LV e' lateral:   8.70 cm/s LVOT diam:     1.90 cm   LV E/e' lateral: 7.8 LV SV:         50 LV SV Index:   25 LVOT Area:     2.84 cm   RIGHT VENTRICLE RV Basal diam:  2.85 cm RV Mid diam:    2.80 cm RV S prime:     15.60 cm/s TAPSE (M-mode): 2.2 cm  LEFT ATRIUM             Index        RIGHT ATRIUM           Index LA diam:        3.20 cm 1.62 cm/m   RA Area:     12.10 cm LA Vol (A2C):   64.3 ml 32.52 ml/m  RA Volume:   25.80 ml  13.05 ml/m LA Vol (A4C):   77.9 ml 39.40 ml/m LA Biplane Vol: 71.3 ml 36.06 ml/m AORTIC VALVE                    PULMONIC VALVE AV Area (Vmax):    2.57 cm     PV Vmax:       0.63 m/s AV Area (Vmean):  2.44 cm     PV Peak grad:  1.6 mmHg AV Area (VTI):     2.51 cm AV Vmax:           88.80 cm/s AV Vmean:          61.300 cm/s AV VTI:            0.199 m AV Peak Grad:      3.2 mmHg AV Mean Grad:      2.0 mmHg LVOT Vmax:         80.60 cm/s LVOT Vmean:        52.700 cm/s LVOT VTI:          0.176 m LVOT/AV VTI ratio: 0.88  AORTA Ao Root diam: 3.10 cm  MITRAL VALVE               TRICUSPID VALVE MV Area (PHT): 2.74 cm    TR Peak grad:   10.1 mmHg MV Area VTI:   1.68 cm    TR Vmax:        159.00 cm/s MV Peak grad:  3.0 mmHg MV Mean grad:  1.0 mmHg    SHUNTS MV Vmax:       0.87 m/s    Systemic VTI:  0.18 m MV Vmean:      38.9 cm/s   Systemic Diam: 1.90 cm MV Decel Time: 277 msec MV E velocity: 68.10 cm/s MV A velocity: 84.90 cm/s MV E/A ratio:  0.80  Dwayne D Callwood MD Electronically signed by Yolonda Kida MD Signature Date/Time: 10/02/2022/1:47:29 PM    Final              Patient Profile     77 y.o. male transferred from [place] with non-ST elevation myocardial infarction and diagnostic cardiac catheterization revealing severe multivessel coronary artery disease here for CABG.  Assessment & Plan    Non-ST  elevation myocardial infarction Severe multivessel coronary artery disease - Cardiac catheterization personally reviewed.  Performed by Dr. Clayborn Bigness at [facility].  Severe left main disease noted.  Echo with normal EF.  No significant valvular disease.  EKG originally with progressive T wave inversions V1 through V3.  Troponin 6500.  Discussed case with Dr. Coralie Common of cardiothoracic surgery.  Transferred here for bypass surgery.  Consultation note from Dr. Lavonna Monarch reviewed. -Continue with heparin IV drip.  Statin, aspirin.  He is not on Eliquis. -Low-dose metoprolol 12.5 mg twice a day.    Hyperlipidemia - Crestor previous on 5 mg, now 20 mg.  High intensity dose.  LDL goal less than 55.  Currently 144.  Repeat as outpatient  Paroxysmal atrial fibrillation - In the past has declined anticoagulation.  Had a history of GI bleed in the setting of Aleve.  Be careful with NSAIDs  History of TIA - September 2022.  Event monitor 30-day monitor discovered paroxysmal atrial fibrillation.  At the time according to history and physical he felt that he did not have sufficient evidence documenting his atrial fibrillation warrant further treatment. -Current Doppler-mild carotid plaque bilaterally without significant stenosis.  CT of chest showed 1.3 cm ill-defined low-density structure is noted within the posterior right lobe of liver. This is incompletely characterized on this exam. In the absence of a known malignancy this is likely benign. Consider follow-up imaging with nonemergent contrast enhanced outpatient liver MRI for more definitive characterization could.  Awaiting surgery Dr. Cyndia Bent Monday  N.p.o. past midnight.  For questions or updates, please contact [place] Please  consult [email or web address] for contact info under        Signed, Candee Furbish, MD  10/04/2022, 8:37 AM

## 2022-10-04 NOTE — Anesthesia Preprocedure Evaluation (Signed)
Anesthesia Evaluation  Patient identified by MRN, date of birth, ID band Patient awake    Reviewed: Allergy & Precautions, H&P , NPO status , Patient's Chart, lab work & pertinent test results  Airway Mallampati: II  TM Distance: >3 FB Neck ROM: Full    Dental no notable dental hx. (+) Teeth Intact, Dental Advisory Given   Pulmonary neg pulmonary ROS   Pulmonary exam normal breath sounds clear to auscultation       Cardiovascular Exercise Tolerance: Good + CAD and + Past MI   Rhythm:Regular Rate:Normal     Neuro/Psych TIA negative psych ROS   GI/Hepatic Neg liver ROS, PUD,,,  Endo/Other  negative endocrine ROS    Renal/GU negative Renal ROS  negative genitourinary   Musculoskeletal   Abdominal   Peds  Hematology negative hematology ROS (+)   Anesthesia Other Findings   Reproductive/Obstetrics negative OB ROS                             Anesthesia Physical Anesthesia Plan  ASA: 4  Anesthesia Plan: General   Post-op Pain Management: Tylenol PO (pre-op)*   Induction: Intravenous  PONV Risk Score and Plan: 2 and Midazolam and Ondansetron  Airway Management Planned: Oral ETT  Additional Equipment: Arterial line, CVP, PA Cath, TEE and Ultrasound Guidance Line Placement  Intra-op Plan:   Post-operative Plan: Post-operative intubation/ventilation  Informed Consent: I have reviewed the patients History and Physical, chart, labs and discussed the procedure including the risks, benefits and alternatives for the proposed anesthesia with the patient or authorized representative who has indicated his/her understanding and acceptance.     Dental advisory given  Plan Discussed with: CRNA  Anesthesia Plan Comments:        Anesthesia Quick Evaluation

## 2022-10-04 NOTE — Progress Notes (Signed)
Telemetry called at 0558 reporting patient had a 7 beat run of SVT with a HR of 128. Writer went to patient's room to assess. Patient was sleeping. No new symptoms.   Dr. Grayce Sessions was paged with above information at [phone]. Continue to monitor.

## 2022-10-04 NOTE — Progress Notes (Signed)
ANTICOAGULATION CONSULT NOTE   Pharmacy Consult for heparin Indication:  CAD awaiting CVTS consult  Allergies  Allergen Reactions   Codeine Nausea Only   Naproxen Sodium Other (See Comments)    Bleeding ulcers   Vital Signs: Temp: 98.9 F (37.2 C) (03/17 0758) Temp Source: Oral (03/17 0758) BP: 131/68 (03/17 0758) Pulse Rate: 61 (03/17 0758)  Labs: Recent Labs    10/02/22 0234 10/02/22 0429 10/02/22 1043 10/02/22 1215 10/03/22 0545 10/04/22 0016  HGB 13.0  --   --   --  11.3* 12.1*  HCT 38.6*  --   --   --  31.7* 35.4*  PLT 177  --   --   --  149* 154  APTT 34  --   --   --   --   --   LABPROT 13.1  --   --   --   --   --   INR 1.0  --   --   --   --   --   HEPARINUNFRC  --   --  0.66  --  0.57 0.63  CREATININE 0.96  --   --   --  1.11  --   TROPONINIHS 187* 1,346* 6,534* 6,270*  --   --      Estimated Creatinine Clearance: 45.7 mL/min (by C-G formula based on SCr of 1.11 mg/dL).   Medical History: Past Medical History:  Diagnosis Date   TIA (transient ischemic attack)     Assessment: 77yo with NSTEMI s/p LHC with mvCAD. Pt remains on IV heparin. Heparin level therapeutic at 0.63, CBC stable.  Goal of Therapy:  Heparin level 0.3-0.7 units/ml Monitor platelets by anticoagulation protocol: Yes   Plan:  Continue heparin at 1100 units/hr Daily heparin level and CBC  Arrie Senate, PharmD, [place], [facility] Clinical Pharmacist [phone] Please check AMION for all [facility] numbers 10/04/2022

## 2022-10-05 ENCOUNTER — Other Ambulatory Visit: Payer: Self-pay

## 2022-10-05 ENCOUNTER — Inpatient Hospital Stay (HOSPITAL_COMMUNITY): Payer: PPO

## 2022-10-05 ENCOUNTER — Inpatient Hospital Stay (HOSPITAL_COMMUNITY): Payer: PPO | Admitting: Anesthesiology

## 2022-10-05 ENCOUNTER — Inpatient Hospital Stay (HOSPITAL_COMMUNITY)
Admission: RE | Disposition: A | Discharge status: Home / Self Care | Payer: Self-pay | Source: Home / Self Care | Attending: Surgery

## 2022-10-05 DIAGNOSIS — Z951 Presence of aortocoronary bypass graft: Secondary | ICD-10-CM

## 2022-10-05 DIAGNOSIS — I2510 Atherosclerotic heart disease of native coronary artery without angina pectoris: Secondary | ICD-10-CM

## 2022-10-05 DIAGNOSIS — I480 Paroxysmal atrial fibrillation: Secondary | ICD-10-CM | POA: Diagnosis not present

## 2022-10-05 DIAGNOSIS — I25110 Atherosclerotic heart disease of native coronary artery with unstable angina pectoris: Secondary | ICD-10-CM | POA: Diagnosis not present

## 2022-10-05 DIAGNOSIS — I252 Old myocardial infarction: Secondary | ICD-10-CM

## 2022-10-05 DIAGNOSIS — I482 Chronic atrial fibrillation, unspecified: Secondary | ICD-10-CM

## 2022-10-05 DIAGNOSIS — Z1152 Encounter for screening for COVID-19: Secondary | ICD-10-CM | POA: Diagnosis not present

## 2022-10-05 DIAGNOSIS — D696 Thrombocytopenia, unspecified: Secondary | ICD-10-CM | POA: Diagnosis not present

## 2022-10-05 DIAGNOSIS — I214 Non-ST elevation (NSTEMI) myocardial infarction: Secondary | ICD-10-CM | POA: Diagnosis not present

## 2022-10-05 DIAGNOSIS — K279 Peptic ulcer, site unspecified, unspecified as acute or chronic, without hemorrhage or perforation: Secondary | ICD-10-CM

## 2022-10-05 HISTORY — PX: TEE WITHOUT CARDIOVERSION: SHX5443

## 2022-10-05 HISTORY — PX: CLIPPING OF ATRIAL APPENDAGE: SHX5773

## 2022-10-05 HISTORY — PX: CORONARY ARTERY BYPASS GRAFT: SHX141

## 2022-10-05 LAB — CBC
HCT: 34.8 % — ABNORMAL LOW (ref 39.0–52.0)
HCT: 27.2 % — ABNORMAL LOW (ref 39.0–52.0)
HCT: 24.5 % — ABNORMAL LOW (ref 39.0–52.0)
Hemoglobin: 12.3 g/dL — ABNORMAL LOW (ref 13.0–17.0)
Hemoglobin: 9.5 g/dL — ABNORMAL LOW (ref 13.0–17.0)
Hemoglobin: 8.4 g/dL — ABNORMAL LOW (ref 13.0–17.0)
MCH: 35.9 pg — ABNORMAL HIGH (ref 26.0–34.0)
MCH: 36.1 pg — ABNORMAL HIGH (ref 26.0–34.0)
MCH: 35.7 pg — ABNORMAL HIGH (ref 26.0–34.0)
MCHC: 35.3 g/dL (ref 30.0–36.0)
MCHC: 34.9 g/dL (ref 30.0–36.0)
MCHC: 34.3 g/dL (ref 30.0–36.0)
MCV: 101.5 fL — ABNORMAL HIGH (ref 80.0–100.0)
MCV: 103.4 fL — ABNORMAL HIGH (ref 80.0–100.0)
MCV: 104.3 fL — ABNORMAL HIGH (ref 80.0–100.0)
Platelets: 155 10*3/uL (ref 150–400)
Platelets: 97 10*3/uL — ABNORMAL LOW (ref 150–400)
Platelets: 118 10*3/uL — ABNORMAL LOW (ref 150–400)
RBC: 3.43 MIL/uL — ABNORMAL LOW (ref 4.22–5.81)
RBC: 2.63 MIL/uL — ABNORMAL LOW (ref 4.22–5.81)
RBC: 2.35 MIL/uL — ABNORMAL LOW (ref 4.22–5.81)
RDW: 11.9 % (ref 11.5–15.5)
RDW: 12.1 % (ref 11.5–15.5)
RDW: 12.3 % (ref 11.5–15.5)
WBC: 5.4 10*3/uL (ref 4.0–10.5)
WBC: 6.2 10*3/uL (ref 4.0–10.5)
WBC: 8.7 10*3/uL (ref 4.0–10.5)
nRBC: 0 % (ref 0.0–0.2)
nRBC: 0 % (ref 0.0–0.2)
nRBC: 0 % (ref 0.0–0.2)

## 2022-10-05 LAB — POCT I-STAT EG7
Acid-Base Excess: 0 mmol/L (ref 0.0–2.0)
Bicarbonate: 25.4 mmol/L (ref 20.0–28.0)
Calcium, Ion: 1.05 mmol/L — ABNORMAL LOW (ref 1.15–1.40)
HCT: 23 % — ABNORMAL LOW (ref 39.0–52.0)
Hemoglobin: 7.8 g/dL — ABNORMAL LOW (ref 13.0–17.0)
O2 Saturation: 81 %
Potassium: 4.8 mmol/L (ref 3.5–5.1)
Sodium: 139 mmol/L (ref 135–145)
TCO2: 27 mmol/L (ref 22–32)
pCO2, Ven: 43.4 mmHg — ABNORMAL LOW (ref 44–60)
pH, Ven: 7.375 (ref 7.25–7.43)
pO2, Ven: 46 mmHg — ABNORMAL HIGH (ref 32–45)

## 2022-10-05 LAB — POCT I-STAT 7, (LYTES, BLD GAS, ICA,H+H)
Acid-Base Excess: 0 mmol/L (ref 0.0–2.0)
Acid-base deficit: 1 mmol/L (ref 0.0–2.0)
Acid-base deficit: 2 mmol/L (ref 0.0–2.0)
Acid-base deficit: 4 mmol/L — ABNORMAL HIGH (ref 0.0–2.0)
Acid-base deficit: 5 mmol/L — ABNORMAL HIGH (ref 0.0–2.0)
Bicarbonate: 24.4 mmol/L (ref 20.0–28.0)
Bicarbonate: 21.8 mmol/L (ref 20.0–28.0)
Bicarbonate: 22.8 mmol/L (ref 20.0–28.0)
Bicarbonate: 20.5 mmol/L (ref 20.0–28.0)
Bicarbonate: 19.1 mmol/L — ABNORMAL LOW (ref 20.0–28.0)
Calcium, Ion: 1.03 mmol/L — ABNORMAL LOW (ref 1.15–1.40)
Calcium, Ion: 1.01 mmol/L — ABNORMAL LOW (ref 1.15–1.40)
Calcium, Ion: 1.02 mmol/L — ABNORMAL LOW (ref 1.15–1.40)
Calcium, Ion: 1.22 mmol/L (ref 1.15–1.40)
Calcium, Ion: 1.17 mmol/L (ref 1.15–1.40)
HCT: 22 % — ABNORMAL LOW (ref 39.0–52.0)
HCT: 21 % — ABNORMAL LOW (ref 39.0–52.0)
HCT: 21 % — ABNORMAL LOW (ref 39.0–52.0)
HCT: 27 % — ABNORMAL LOW (ref 39.0–52.0)
HCT: 24 % — ABNORMAL LOW (ref 39.0–52.0)
Hemoglobin: 7.5 g/dL — ABNORMAL LOW (ref 13.0–17.0)
Hemoglobin: 7.1 g/dL — ABNORMAL LOW (ref 13.0–17.0)
Hemoglobin: 7.1 g/dL — ABNORMAL LOW (ref 13.0–17.0)
Hemoglobin: 9.2 g/dL — ABNORMAL LOW (ref 13.0–17.0)
Hemoglobin: 8.2 g/dL — ABNORMAL LOW (ref 13.0–17.0)
O2 Saturation: 100 %
O2 Saturation: 100 %
O2 Saturation: 100 %
O2 Saturation: 100 %
O2 Saturation: 100 %
Patient temperature: 35.8
Patient temperature: 37.2
Potassium: 5.1 mmol/L (ref 3.5–5.1)
Potassium: 4.3 mmol/L (ref 3.5–5.1)
Potassium: 4.9 mmol/L (ref 3.5–5.1)
Potassium: 3.8 mmol/L (ref 3.5–5.1)
Potassium: 4.3 mmol/L (ref 3.5–5.1)
Sodium: 138 mmol/L (ref 135–145)
Sodium: 137 mmol/L (ref 135–145)
Sodium: 137 mmol/L (ref 135–145)
Sodium: 139 mmol/L (ref 135–145)
Sodium: 139 mmol/L (ref 135–145)
TCO2: 26 mmol/L (ref 22–32)
TCO2: 23 mmol/L (ref 22–32)
TCO2: 24 mmol/L (ref 22–32)
TCO2: 22 mmol/L (ref 22–32)
TCO2: 20 mmol/L — ABNORMAL LOW (ref 22–32)
pCO2 arterial: 38.3 mmHg (ref 32–48)
pCO2 arterial: 30.4 mmHg — ABNORMAL LOW (ref 32–48)
pCO2 arterial: 32.7 mmHg (ref 32–48)
pCO2 arterial: 33.2 mmHg (ref 32–48)
pCO2 arterial: 31.4 mmHg — ABNORMAL LOW (ref 32–48)
pH, Arterial: 7.413 (ref 7.35–7.45)
pH, Arterial: 7.452 — ABNORMAL HIGH (ref 7.35–7.45)
pH, Arterial: 7.464 — ABNORMAL HIGH (ref 7.35–7.45)
pH, Arterial: 7.394 (ref 7.35–7.45)
pH, Arterial: 7.392 (ref 7.35–7.45)
pO2, Arterial: 358 mmHg — ABNORMAL HIGH (ref 83–108)
pO2, Arterial: 299 mmHg — ABNORMAL HIGH (ref 83–108)
pO2, Arterial: 332 mmHg — ABNORMAL HIGH (ref 83–108)
pO2, Arterial: 225 mmHg — ABNORMAL HIGH (ref 83–108)
pO2, Arterial: 174 mmHg — ABNORMAL HIGH (ref 83–108)

## 2022-10-05 LAB — POCT I-STAT, CHEM 8
BUN: 14 mg/dL (ref 8–23)
BUN: 14 mg/dL (ref 8–23)
BUN: 13 mg/dL (ref 8–23)
BUN: 13 mg/dL (ref 8–23)
Calcium, Ion: 1.21 mmol/L (ref 1.15–1.40)
Calcium, Ion: 1.27 mmol/L (ref 1.15–1.40)
Calcium, Ion: 1.03 mmol/L — ABNORMAL LOW (ref 1.15–1.40)
Calcium, Ion: 1.04 mmol/L — ABNORMAL LOW (ref 1.15–1.40)
Chloride: 105 mmol/L (ref 98–111)
Chloride: 103 mmol/L (ref 98–111)
Chloride: 100 mmol/L (ref 98–111)
Chloride: 104 mmol/L (ref 98–111)
Creatinine, Ser: 0.9 mg/dL (ref 0.61–1.24)
Creatinine, Ser: 0.8 mg/dL (ref 0.61–1.24)
Creatinine, Ser: 0.7 mg/dL (ref 0.61–1.24)
Creatinine, Ser: 0.7 mg/dL (ref 0.61–1.24)
Glucose, Bld: 128 mg/dL — ABNORMAL HIGH (ref 70–99)
Glucose, Bld: 92 mg/dL (ref 70–99)
Glucose, Bld: 122 mg/dL — ABNORMAL HIGH (ref 70–99)
Glucose, Bld: 132 mg/dL — ABNORMAL HIGH (ref 70–99)
HCT: 33 % — ABNORMAL LOW (ref 39.0–52.0)
HCT: 31 % — ABNORMAL LOW (ref 39.0–52.0)
HCT: 24 % — ABNORMAL LOW (ref 39.0–52.0)
HCT: 26 % — ABNORMAL LOW (ref 39.0–52.0)
Hemoglobin: 11.2 g/dL — ABNORMAL LOW (ref 13.0–17.0)
Hemoglobin: 10.5 g/dL — ABNORMAL LOW (ref 13.0–17.0)
Hemoglobin: 8.2 g/dL — ABNORMAL LOW (ref 13.0–17.0)
Hemoglobin: 8.8 g/dL — ABNORMAL LOW (ref 13.0–17.0)
Potassium: 3.3 mmol/L — ABNORMAL LOW (ref 3.5–5.1)
Potassium: 3.8 mmol/L (ref 3.5–5.1)
Potassium: 4.3 mmol/L (ref 3.5–5.1)
Potassium: 4.7 mmol/L (ref 3.5–5.1)
Sodium: 140 mmol/L (ref 135–145)
Sodium: 140 mmol/L (ref 135–145)
Sodium: 138 mmol/L (ref 135–145)
Sodium: 139 mmol/L (ref 135–145)
TCO2: 26 mmol/L (ref 22–32)
TCO2: 29 mmol/L (ref 22–32)
TCO2: 24 mmol/L (ref 22–32)
TCO2: 26 mmol/L (ref 22–32)

## 2022-10-05 LAB — HEMOGLOBIN AND HEMATOCRIT, BLOOD
HCT: 23.1 % — ABNORMAL LOW (ref 39.0–52.0)
Hemoglobin: 8.2 g/dL — ABNORMAL LOW (ref 13.0–17.0)

## 2022-10-05 LAB — BASIC METABOLIC PANEL
Anion gap: 10 (ref 5–15)
Anion gap: 11 (ref 5–15)
BUN: 16 mg/dL (ref 8–23)
BUN: 12 mg/dL (ref 8–23)
CO2: 23 mmol/L (ref 22–32)
CO2: 21 mmol/L — ABNORMAL LOW (ref 22–32)
Calcium: 8.6 mg/dL — ABNORMAL LOW (ref 8.9–10.3)
Calcium: 7.9 mg/dL — ABNORMAL LOW (ref 8.9–10.3)
Chloride: 105 mmol/L (ref 98–111)
Chloride: 107 mmol/L (ref 98–111)
Creatinine, Ser: 1.07 mg/dL (ref 0.61–1.24)
Creatinine, Ser: 1.01 mg/dL (ref 0.61–1.24)
GFR, Estimated: >60 mL/min (ref >60)
GFR, Estimated: >60 mL/min (ref >60)
Glucose, Bld: 90 mg/dL (ref 70–99)
Glucose, Bld: 131 mg/dL — ABNORMAL HIGH (ref 70–99)
Potassium: 3.4 mmol/L — ABNORMAL LOW (ref 3.5–5.1)
Potassium: 4.3 mmol/L (ref 3.5–5.1)
Sodium: 138 mmol/L (ref 135–145)
Sodium: 139 mmol/L (ref 135–145)

## 2022-10-05 LAB — GLUCOSE, CAPILLARY
Glucose-Capillary: 129 mg/dL — ABNORMAL HIGH (ref 70–99)
Glucose-Capillary: 118 mg/dL — ABNORMAL HIGH (ref 70–99)
Glucose-Capillary: 128 mg/dL — ABNORMAL HIGH (ref 70–99)
Glucose-Capillary: 124 mg/dL — ABNORMAL HIGH (ref 70–99)
Glucose-Capillary: 144 mg/dL — ABNORMAL HIGH (ref 70–99)
Glucose-Capillary: 130 mg/dL — ABNORMAL HIGH (ref 70–99)
Glucose-Capillary: 136 mg/dL — ABNORMAL HIGH (ref 70–99)

## 2022-10-05 LAB — ECHO INTRAOPERATIVE TEE
Height: 68 in
Weight: 2010.6 oz

## 2022-10-05 LAB — MAGNESIUM: Magnesium: 2.9 mg/dL — ABNORMAL HIGH (ref 1.7–2.4)

## 2022-10-05 LAB — APTT: aPTT: 35 seconds (ref 24–36)

## 2022-10-05 LAB — PROTIME-INR
INR: 1.3 — ABNORMAL HIGH (ref 0.8–1.2)
Prothrombin Time: 16.1 seconds — ABNORMAL HIGH (ref 11.4–15.2)

## 2022-10-05 LAB — HEPARIN LEVEL (UNFRACTIONATED): Heparin Unfractionated: 0.77 IU/mL — ABNORMAL HIGH (ref 0.30–0.70)

## 2022-10-05 LAB — PLATELET COUNT: Platelets: 118 10*3/uL — ABNORMAL LOW (ref 150–400)

## 2022-10-05 LAB — HEMOGLOBIN A1C
Hgb A1c MFr Bld: 5.3 % (ref 4.8–5.6)
Mean Plasma Glucose: 105 mg/dL

## 2022-10-05 SURGERY — CORONARY ARTERY BYPASS GRAFTING (CABG)
Anesthesia: General | Site: Chest

## 2022-10-05 MED ORDER — POTASSIUM CHLORIDE 10 MEQ/50ML IV SOLN
10.0000 meq | INTRAVENOUS | Status: AC
  Administered 2022-10-05 (×3): 10 meq via INTRAVENOUS

## 2022-10-05 MED ORDER — ACETAMINOPHEN 160 MG/5ML PO SOLN: 1000.0000 mg | Freq: Four times a day (QID) | ORAL | Status: AC

## 2022-10-05 MED ORDER — PHENYLEPHRINE 80 MCG/ML (10ML) SYRINGE FOR IV PUSH (FOR BLOOD PRESSURE SUPPORT)
PREFILLED_SYRINGE | INTRAVENOUS | Status: DC | PRN
  Administered 2022-10-05 (×2): 40 ug via INTRAVENOUS
  Administered 2022-10-05: 80 ug via INTRAVENOUS

## 2022-10-05 MED ORDER — INSULIN REGULAR(HUMAN) IN NACL 100-0.9 UT/100ML-% IV SOLN: INTRAVENOUS | Status: DC

## 2022-10-05 MED ORDER — TRAMADOL HCL 50 MG PO TABS: 50.0000 mg | ORAL_TABLET | ORAL | Status: DC | PRN

## 2022-10-05 MED ORDER — ASPIRIN 325 MG PO TBEC
325.0000 mg | DELAYED_RELEASE_TABLET | Freq: Every day | ORAL | Status: DC
  Administered 2022-10-06: 325 mg via ORAL
  Filled 2022-10-05: qty 1

## 2022-10-05 MED ORDER — CALCIUM CHLORIDE 10 % IV SOLN
INTRAVENOUS | Status: AC
  Filled 2022-10-05: qty 10

## 2022-10-05 MED ORDER — SODIUM CHLORIDE 0.9 % IV SOLN: INTRAVENOUS | Status: DC

## 2022-10-05 MED ORDER — DOCUSATE SODIUM 100 MG PO CAPS
200.0000 mg | ORAL_CAPSULE | Freq: Every day | ORAL | Status: DC
  Administered 2022-10-06 – 2022-10-07 (×2): 200 mg via ORAL
  Filled 2022-10-05 (×2): qty 2

## 2022-10-05 MED ORDER — LACTATED RINGERS IV SOLN: INTRAVENOUS | Status: DC | PRN

## 2022-10-05 MED ORDER — OXYCODONE HCL 5 MG PO TABS
5.0000 mg | ORAL_TABLET | ORAL | Status: DC | PRN
  Administered 2022-10-06: 5 mg via ORAL
  Administered 2022-10-06: 10 mg via ORAL
  Filled 2022-10-05: qty 1
  Filled 2022-10-05: qty 2

## 2022-10-05 MED ORDER — BISACODYL 5 MG PO TBEC
10.0000 mg | DELAYED_RELEASE_TABLET | Freq: Every day | ORAL | Status: DC
  Administered 2022-10-06 – 2022-10-07 (×2): 10 mg via ORAL
  Filled 2022-10-05 (×2): qty 2

## 2022-10-05 MED ORDER — ACETAMINOPHEN 650 MG RE SUPP
650.0000 mg | Freq: Once | RECTAL | Status: AC
  Administered 2022-10-05: 650 mg via RECTAL

## 2022-10-05 MED ORDER — FENTANYL CITRATE (PF) 250 MCG/5ML IJ SOLN
INTRAMUSCULAR | Status: AC
  Filled 2022-10-05: qty 5

## 2022-10-05 MED ORDER — SODIUM BICARBONATE 8.4 % IV SOLN
50.0000 meq | Freq: Once | INTRAVENOUS | Status: AC
  Administered 2022-10-05: 50 meq via INTRAVENOUS

## 2022-10-05 MED ORDER — CEFAZOLIN SODIUM-DEXTROSE 2-4 GM/100ML-% IV SOLN
2.0000 g | Freq: Three times a day (TID) | INTRAVENOUS | Status: AC
  Administered 2022-10-05 – 2022-10-07 (×6): 2 g via INTRAVENOUS
  Filled 2022-10-05 (×6): qty 100

## 2022-10-05 MED ORDER — CHLORHEXIDINE GLUCONATE 0.12 % MT SOLN
15.0000 mL | OROMUCOSAL | Status: AC
  Administered 2022-10-05: 15 mL via OROMUCOSAL
  Filled 2022-10-05: qty 15

## 2022-10-05 MED ORDER — METOPROLOL TARTRATE 25 MG/10 ML ORAL SUSPENSION: 12.5000 mg | Freq: Two times a day (BID) | ORAL | Status: DC

## 2022-10-05 MED ORDER — MORPHINE SULFATE (PF) 2 MG/ML IV SOLN
1.0000 mg | INTRAVENOUS | Status: DC | PRN
  Administered 2022-10-05 – 2022-10-06 (×2): 2 mg via INTRAVENOUS
  Filled 2022-10-05 (×2): qty 1

## 2022-10-05 MED ORDER — ASPIRIN 81 MG PO CHEW: 324.0000 mg | CHEWABLE_TABLET | Freq: Every day | ORAL | Status: DC

## 2022-10-05 MED ORDER — LACTATED RINGERS IV SOLN
500.0000 mL | Freq: Once | INTRAVENOUS | Status: AC | PRN
  Administered 2022-10-05: 500 mL via INTRAVENOUS

## 2022-10-05 MED ORDER — LACTATED RINGERS IV SOLN: INTRAVENOUS | Status: DC

## 2022-10-05 MED ORDER — METOPROLOL TARTRATE 5 MG/5ML IV SOLN: 2.5000 mg | INTRAVENOUS | Status: DC | PRN

## 2022-10-05 MED ORDER — METOPROLOL TARTRATE 12.5 MG HALF TABLET
12.5000 mg | ORAL_TABLET | Freq: Two times a day (BID) | ORAL | Status: DC
  Administered 2022-10-06 – 2022-10-07 (×2): 12.5 mg via ORAL
  Filled 2022-10-05 (×2): qty 1

## 2022-10-05 MED ORDER — BISACODYL 10 MG RE SUPP: 10.0000 mg | Freq: Every day | RECTAL | Status: DC

## 2022-10-05 MED ORDER — ALBUMIN HUMAN 5 % IV SOLN: INTRAVENOUS | Status: DC | PRN

## 2022-10-05 MED ORDER — MAGNESIUM SULFATE 4 GM/100ML IV SOLN
4.0000 g | Freq: Once | INTRAVENOUS | Status: AC
  Administered 2022-10-05: 4 g via INTRAVENOUS
  Filled 2022-10-05: qty 100

## 2022-10-05 MED ORDER — NITROGLYCERIN IN D5W 200-5 MCG/ML-% IV SOLN: 0.0000 ug/min | INTRAVENOUS | Status: DC

## 2022-10-05 MED ORDER — ROCURONIUM BROMIDE 10 MG/ML (PF) SYRINGE
PREFILLED_SYRINGE | INTRAVENOUS | Status: AC
  Filled 2022-10-05: qty 20

## 2022-10-05 MED ORDER — PHENYLEPHRINE HCL-NACL 20-0.9 MG/250ML-% IV SOLN
0.0000 ug/min | INTRAVENOUS | Status: DC
  Administered 2022-10-05: 18 ug/min via INTRAVENOUS
  Filled 2022-10-05: qty 250

## 2022-10-05 MED ORDER — SODIUM CHLORIDE 0.45 % IV SOLN: INTRAVENOUS | Status: DC | PRN

## 2022-10-05 MED ORDER — MIDAZOLAM HCL (PF) 5 MG/ML IJ SOLN
INTRAMUSCULAR | Status: DC | PRN
  Administered 2022-10-05: 3 mg via INTRAVENOUS
  Administered 2022-10-05: 2 mg via INTRAVENOUS
  Administered 2022-10-05: 5 mg via INTRAVENOUS

## 2022-10-05 MED ORDER — ONDANSETRON HCL 4 MG/2ML IJ SOLN
4.0000 mg | Freq: Four times a day (QID) | INTRAMUSCULAR | Status: DC | PRN
  Administered 2022-10-07: 4 mg via INTRAVENOUS
  Filled 2022-10-05: qty 2

## 2022-10-05 MED ORDER — CHLORHEXIDINE GLUCONATE CLOTH 2 % EX PADS
6.0000 | MEDICATED_PAD | Freq: Once | CUTANEOUS | Status: AC
  Administered 2022-10-05: 6 via TOPICAL

## 2022-10-05 MED ORDER — CALCIUM CHLORIDE 10 % IV SOLN
INTRAVENOUS | Status: DC | PRN
  Administered 2022-10-05 (×3): 200 mg via INTRAVENOUS

## 2022-10-05 MED ORDER — PROPOFOL 10 MG/ML IV BOLUS
INTRAVENOUS | Status: AC
  Filled 2022-10-05: qty 20

## 2022-10-05 MED ORDER — SODIUM CHLORIDE 0.9% FLUSH: 3.0000 mL | INTRAVENOUS | Status: DC | PRN

## 2022-10-05 MED ORDER — ALBUMIN HUMAN 5 % IV SOLN
250.0000 mL | INTRAVENOUS | Status: AC | PRN
  Administered 2022-10-05 (×4): 12.5 g via INTRAVENOUS
  Filled 2022-10-05: qty 250

## 2022-10-05 MED ORDER — DEXMEDETOMIDINE HCL IN NACL 400 MCG/100ML IV SOLN: 0.0000 ug/kg/h | INTRAVENOUS | Status: DC

## 2022-10-05 MED ORDER — PLASMA-LYTE A IV SOLN: INTRAVENOUS | Status: DC | PRN

## 2022-10-05 MED ORDER — FENTANYL CITRATE (PF) 250 MCG/5ML IJ SOLN
INTRAMUSCULAR | Status: DC | PRN
  Administered 2022-10-05: 500 ug via INTRAVENOUS
  Administered 2022-10-05: 100 ug via INTRAVENOUS
  Administered 2022-10-05: 250 ug via INTRAVENOUS
  Administered 2022-10-05: 150 ug via INTRAVENOUS
  Administered 2022-10-05: 250 ug via INTRAVENOUS

## 2022-10-05 MED ORDER — MIDAZOLAM HCL 2 MG/2ML IJ SOLN: 2.0000 mg | INTRAMUSCULAR | Status: DC | PRN

## 2022-10-05 MED ORDER — PANTOPRAZOLE SODIUM 40 MG PO TBEC
40.0000 mg | DELAYED_RELEASE_TABLET | Freq: Every day | ORAL | Status: DC
  Administered 2022-10-07 – 2022-10-11 (×5): 40 mg via ORAL
  Filled 2022-10-05 (×5): qty 1

## 2022-10-05 MED ORDER — ACETAMINOPHEN 500 MG PO TABS
1000.0000 mg | ORAL_TABLET | Freq: Four times a day (QID) | ORAL | Status: AC
  Administered 2022-10-06 – 2022-10-10 (×18): 1000 mg via ORAL
  Filled 2022-10-05 (×18): qty 2

## 2022-10-05 MED ORDER — PHENYLEPHRINE 80 MCG/ML (10ML) SYRINGE FOR IV PUSH (FOR BLOOD PRESSURE SUPPORT)
PREFILLED_SYRINGE | INTRAVENOUS | Status: AC
  Filled 2022-10-05: qty 10

## 2022-10-05 MED ORDER — 0.9 % SODIUM CHLORIDE (POUR BTL) OPTIME
TOPICAL | Status: DC | PRN
  Administered 2022-10-05: 5000 mL

## 2022-10-05 MED ORDER — FAMOTIDINE IN NACL 20-0.9 MG/50ML-% IV SOLN
20.0000 mg | Freq: Two times a day (BID) | INTRAVENOUS | Status: AC
  Administered 2022-10-05 – 2022-10-06 (×2): 20 mg via INTRAVENOUS
  Filled 2022-10-05 (×2): qty 50

## 2022-10-05 MED ORDER — SODIUM CHLORIDE 0.9% FLUSH
3.0000 mL | Freq: Two times a day (BID) | INTRAVENOUS | Status: DC
  Administered 2022-10-06 – 2022-10-07 (×3): 3 mL via INTRAVENOUS

## 2022-10-05 MED ORDER — DEXTROSE 50 % IV SOLN: 0.0000 mL | INTRAVENOUS | Status: DC | PRN

## 2022-10-05 MED ORDER — ROCURONIUM BROMIDE 10 MG/ML (PF) SYRINGE
PREFILLED_SYRINGE | INTRAVENOUS | Status: DC | PRN
  Administered 2022-10-05: 20 mg via INTRAVENOUS
  Administered 2022-10-05 (×2): 30 mg via INTRAVENOUS
  Administered 2022-10-05: 100 mg via INTRAVENOUS

## 2022-10-05 MED ORDER — ACETAMINOPHEN 160 MG/5ML PO SOLN: 650.0000 mg | Freq: Once | ORAL | Status: AC

## 2022-10-05 MED ORDER — PROTAMINE SULFATE 10 MG/ML IV SOLN
INTRAVENOUS | Status: AC
  Filled 2022-10-05: qty 25

## 2022-10-05 MED ORDER — HEPARIN SODIUM (PORCINE) 1000 UNIT/ML IJ SOLN
INTRAMUSCULAR | Status: AC
  Filled 2022-10-05: qty 1

## 2022-10-05 MED ORDER — THROMBIN (RECOMBINANT) 20000 UNITS EX SOLR
CUTANEOUS | Status: AC
  Filled 2022-10-05: qty 20000

## 2022-10-05 MED ORDER — PROTAMINE SULFATE 10 MG/ML IV SOLN
INTRAVENOUS | Status: DC | PRN
  Administered 2022-10-05: 10 mg via INTRAVENOUS
  Administered 2022-10-05 (×2): 90 mg via INTRAVENOUS

## 2022-10-05 MED ORDER — CHLORHEXIDINE GLUCONATE CLOTH 2 % EX PADS
6.0000 | MEDICATED_PAD | Freq: Every day | CUTANEOUS | Status: DC
  Administered 2022-10-05 – 2022-10-07 (×3): 6 via TOPICAL

## 2022-10-05 MED ORDER — THROMBIN 20000 UNITS EX SOLR: OROMUCOSAL | Status: DC | PRN

## 2022-10-05 MED ORDER — SODIUM CHLORIDE 0.9 % IV SOLN
250.0000 mL | INTRAVENOUS | Status: DC
  Administered 2022-10-06: 250 mL via INTRAVENOUS

## 2022-10-05 MED ORDER — HEPARIN SODIUM (PORCINE) 1000 UNIT/ML IJ SOLN
INTRAMUSCULAR | Status: DC | PRN
  Administered 2022-10-05: 19000 [IU] via INTRAVENOUS

## 2022-10-05 MED ORDER — PROPOFOL 10 MG/ML IV BOLUS
INTRAVENOUS | Status: DC | PRN
  Administered 2022-10-05: 50 mg via INTRAVENOUS

## 2022-10-05 MED ORDER — HEMOSTATIC AGENTS (NO CHARGE) OPTIME
TOPICAL | Status: DC | PRN
  Administered 2022-10-05: 1 via TOPICAL

## 2022-10-05 MED ORDER — VANCOMYCIN HCL IN DEXTROSE 1-5 GM/200ML-% IV SOLN
1000.0000 mg | Freq: Once | INTRAVENOUS | Status: AC
  Administered 2022-10-05: 1000 mg via INTRAVENOUS
  Filled 2022-10-05: qty 200

## 2022-10-05 MED ORDER — ROSUVASTATIN CALCIUM 20 MG PO TABS
40.0000 mg | ORAL_TABLET | Freq: Every day | ORAL | Status: DC
  Administered 2022-10-06 – 2022-10-10 (×6): 40 mg via ORAL
  Filled 2022-10-05 (×6): qty 2

## 2022-10-05 MED ORDER — MIDAZOLAM HCL (PF) 10 MG/2ML IJ SOLN
INTRAMUSCULAR | Status: AC
  Filled 2022-10-05: qty 2

## 2022-10-05 SURGICAL SUPPLY — 114 items
ANTIFOG SOL W/FOAM PAD STRL (MISCELLANEOUS) ×3
ARTICLIP LAA PROCLIP II 40 (Clip) ×3 IMPLANT
BAG DECANTER FOR FLEXI CONT (MISCELLANEOUS) ×3 IMPLANT
BLADE CLIPPER SURG (BLADE) ×3 IMPLANT
BLADE STERNUM SYSTEM 6 (BLADE) ×3 IMPLANT
BLADE SURG 11 STRL SS (BLADE) IMPLANT
BNDG ELASTIC 4X5.8 VLCR STR LF (GAUZE/BANDAGES/DRESSINGS) ×3 IMPLANT
BNDG ELASTIC 6X10 VLCR STRL LF (GAUZE/BANDAGES/DRESSINGS) IMPLANT
BNDG ELASTIC 6X5.8 VLCR STR LF (GAUZE/BANDAGES/DRESSINGS) ×3 IMPLANT
BNDG GAUZE DERMACEA FLUFF 4 (GAUZE/BANDAGES/DRESSINGS) ×3 IMPLANT
CANISTER SUCT 3000ML PPV (MISCELLANEOUS) ×3 IMPLANT
CANNULA ARTERIAL VENT 3/8 20FR (CANNULA) IMPLANT
CANNULA MC2 2 STG 36/46 CONN (CANNULA) IMPLANT
CATH ROBINSON RED A/P 18FR (CATHETERS) ×6 IMPLANT
CATH THORACIC 28FR (CATHETERS) ×3 IMPLANT
CATH THORACIC 36FR (CATHETERS) ×3 IMPLANT
CATH THORACIC 36FR RT ANG (CATHETERS) ×3 IMPLANT
CLIP TI MEDIUM 24 (CLIP) IMPLANT
CLIP TI WIDE RED SMALL 24 (CLIP) IMPLANT
CONN DRAIN DUAL-IN SOMAVAC (TUBING)
CONN Y 3/8X3/8X3/8  BEN (MISCELLANEOUS) ×6
CONN Y 3/8X3/8X3/8 BEN (MISCELLANEOUS) IMPLANT
CONNECTOR DRAIN DUAL-IN SOMAVC (TUBING) IMPLANT
CONTAINER PROTECT SURGISLUSH (MISCELLANEOUS) ×6 IMPLANT
DEVICE ATRICLIP LAA PRCLPII 40 (Clip) IMPLANT
DRAPE CARDIOVASCULAR INCISE (DRAPES) ×3
DRAPE SRG 135X102X78XABS (DRAPES) ×3 IMPLANT
DRAPE WARM FLUID 44X44 (DRAPES) ×3 IMPLANT
DRSG COVADERM 4X14 (GAUZE/BANDAGES/DRESSINGS) ×3 IMPLANT
ELECT BLADE 4.0 EZ CLEAN MEGAD (MISCELLANEOUS) ×3
ELECT CAUTERY BLADE 6.4 (BLADE) ×3 IMPLANT
ELECT REM PT RETURN 9FT ADLT (ELECTROSURGICAL) ×6
ELECTRODE BLDE 4.0 EZ CLN MEGD (MISCELLANEOUS) IMPLANT
ELECTRODE REM PT RTRN 9FT ADLT (ELECTROSURGICAL) ×6 IMPLANT
FELT TEFLON 1X6 (MISCELLANEOUS) ×6 IMPLANT
GAUZE 4X4 16PLY ~~LOC~~+RFID DBL (SPONGE) ×3 IMPLANT
GAUZE SPONGE 4X4 12PLY STRL (GAUZE/BANDAGES/DRESSINGS) ×6 IMPLANT
GLOVE BIO SURGEON STRL SZ 6 (GLOVE) IMPLANT
GLOVE BIO SURGEON STRL SZ 6.5 (GLOVE) IMPLANT
GLOVE BIO SURGEON STRL SZ7 (GLOVE) IMPLANT
GLOVE BIO SURGEON STRL SZ7.5 (GLOVE) IMPLANT
GLOVE BIOGEL PI IND STRL 6 (GLOVE) IMPLANT
GLOVE BIOGEL PI IND STRL 6.5 (GLOVE) IMPLANT
GLOVE BIOGEL PI IND STRL 7.0 (GLOVE) IMPLANT
GLOVE ORTHO TXT STRL SZ7.5 (GLOVE) IMPLANT
GLOVE SURG MICRO LTX SZ7 (GLOVE) ×6 IMPLANT
GLOVE SURG SS PI 7.5 STRL IVOR (GLOVE) IMPLANT
GOWN STRL REUS W/ TWL LRG LVL3 (GOWN DISPOSABLE) ×12 IMPLANT
GOWN STRL REUS W/ TWL XL LVL3 (GOWN DISPOSABLE) ×3 IMPLANT
GOWN STRL REUS W/TWL LRG LVL3 (GOWN DISPOSABLE) ×12
GOWN STRL REUS W/TWL XL LVL3 (GOWN DISPOSABLE) ×24
HEMOSTAT POWDER SURGIFOAM 1G (HEMOSTASIS) ×9 IMPLANT
HEMOSTAT SURGICEL 2X14 (HEMOSTASIS) ×3 IMPLANT
INSERT FOGARTY 61MM (MISCELLANEOUS) IMPLANT
INSERT FOGARTY XLG (MISCELLANEOUS) IMPLANT
KIT BASIN OR (CUSTOM PROCEDURE TRAY) ×3 IMPLANT
KIT CATH CPB BARTLE (MISCELLANEOUS) ×3 IMPLANT
KIT SUCTION CATH 14FR (SUCTIONS) ×3 IMPLANT
KIT TURNOVER KIT B (KITS) ×3 IMPLANT
KIT VASOVIEW HEMOPRO 2 VH 4000 (KITS) ×3 IMPLANT
NS IRRIG 1000ML POUR BTL (IV SOLUTION) ×15 IMPLANT
PACK E OPEN HEART (SUTURE) ×3 IMPLANT
PACK OPEN HEART (CUSTOM PROCEDURE TRAY) ×3 IMPLANT
PAD ARMBOARD 7.5X6 YLW CONV (MISCELLANEOUS) ×6 IMPLANT
PAD ELECT DEFIB RADIOL ZOLL (MISCELLANEOUS) ×3 IMPLANT
PENCIL BUTTON HOLSTER BLD 10FT (ELECTRODE) ×3 IMPLANT
POSITIONER HEAD DONUT 9IN (MISCELLANEOUS) ×3 IMPLANT
PUNCH AORTIC ROTATE 4.0MM (MISCELLANEOUS) IMPLANT
PUNCH AORTIC ROTATE 4.5MM 8IN (MISCELLANEOUS) ×3 IMPLANT
PUNCH AORTIC ROTATE 5MM 8IN (MISCELLANEOUS) IMPLANT
SET MPS 3-ND DEL (MISCELLANEOUS) IMPLANT
SOLUTION ANTFG W/FOAM PAD STRL (MISCELLANEOUS) IMPLANT
SPONGE INTESTINAL PEANUT (DISPOSABLE) IMPLANT
SPONGE T-LAP 18X18 ~~LOC~~+RFID (SPONGE) ×12 IMPLANT
SPONGE T-LAP 4X18 ~~LOC~~+RFID (SPONGE) ×6 IMPLANT
SUPPORT HEART JANKE-BARRON (MISCELLANEOUS) ×3 IMPLANT
SUT BONE WAX W31G (SUTURE) ×3 IMPLANT
SUT MNCRL AB 4-0 PS2 18 (SUTURE) IMPLANT
SUT PROLENE 3 0 SH DA (SUTURE) IMPLANT
SUT PROLENE 3 0 SH1 36 (SUTURE) ×3 IMPLANT
SUT PROLENE 4 0 RB 1 (SUTURE) ×3
SUT PROLENE 4 0 SH DA (SUTURE) IMPLANT
SUT PROLENE 4-0 RB1 .5 CRCL 36 (SUTURE) IMPLANT
SUT PROLENE 5 0 C 1 36 (SUTURE) IMPLANT
SUT PROLENE 6 0 C 1 30 (SUTURE) IMPLANT
SUT PROLENE 7 0 BV 1 (SUTURE) IMPLANT
SUT PROLENE 7 0 BV1 MDA (SUTURE) ×3 IMPLANT
SUT PROLENE 8 0 BV175 6 (SUTURE) IMPLANT
SUT SILK  1 MH (SUTURE)
SUT SILK 1 MH (SUTURE) IMPLANT
SUT SILK 2 0 SH (SUTURE) IMPLANT
SUT STEEL 6MS V (SUTURE) IMPLANT
SUT STEEL STERNAL CCS#1 18IN (SUTURE) IMPLANT
SUT STEEL SZ 6 DBL 3X14 BALL (SUTURE) IMPLANT
SUT VIC AB 1 CTX 36 (SUTURE) ×6
SUT VIC AB 1 CTX36XBRD ANBCTR (SUTURE) ×6 IMPLANT
SUT VIC AB 2-0 CT1 27 (SUTURE) ×3
SUT VIC AB 2-0 CT1 TAPERPNT 27 (SUTURE) IMPLANT
SUT VIC AB 2-0 CTX 27 (SUTURE) IMPLANT
SUT VIC AB 3-0 SH 27 (SUTURE)
SUT VIC AB 3-0 SH 27X BRD (SUTURE) IMPLANT
SUT VIC AB 3-0 X1 27 (SUTURE) IMPLANT
SUT VICRYL 4-0 PS2 18IN ABS (SUTURE) IMPLANT
SYSTEM SAHARA CHEST DRAIN ATS (WOUND CARE) ×3 IMPLANT
TAPE CLOTH SURG 4X10 WHT LF (GAUZE/BANDAGES/DRESSINGS) IMPLANT
TAPE PAPER 2X10 WHT MICROPORE (GAUZE/BANDAGES/DRESSINGS) IMPLANT
TOWEL GREEN STERILE (TOWEL DISPOSABLE) ×3 IMPLANT
TOWEL GREEN STERILE FF (TOWEL DISPOSABLE) ×3 IMPLANT
TRAY FOLEY MTR SLVR 14FR STAT (SET/KITS/TRAYS/PACK) IMPLANT
TRAY FOLEY SLVR 16FR TEMP STAT (SET/KITS/TRAYS/PACK) ×3 IMPLANT
TUBE SUCT INTRACARD DLP 20F (MISCELLANEOUS) IMPLANT
TUBING LAP HI FLOW INSUFFLATIO (TUBING) ×3 IMPLANT
UNDERPAD 30X36 HEAVY ABSORB (UNDERPADS AND DIAPERS) ×3 IMPLANT
WATER STERILE IRR 1000ML POUR (IV SOLUTION) ×6 IMPLANT

## 2022-10-05 NOTE — Anesthesia Procedure Notes (Signed)
Arterial Line Insertion Start/End3/18/2024 7:00 AM, 10/05/2022 7:05 AM Performed by: CRNA  Patient location: Pre-op. Preanesthetic checklist: patient identified, IV checked, site marked, risks and benefits discussed, surgical consent, monitors and equipment checked, pre-op evaluation, timeout performed and anesthesia consent Lidocaine 1% used for infiltration Left, radial was placed Catheter size: 20 G Hand hygiene performed  and maximum sterile barriers used   Attempts: 1 Procedure performed without using ultrasound guided technique. Following insertion, dressing applied and Biopatch. Post procedure assessment: normal  Patient tolerated the procedure well with no immediate complications.

## 2022-10-05 NOTE — Transfer of Care (Signed)
Immediate Anesthesia Transfer of Care Note  Patient: Ruben [name]  Procedure(s) Performed: CORONARY ARTERY BYPASS GRAFTING (CABG) X THREE BYPASSES USING OPEN LEFT INTERNAL MAMMARY ARTERY AND ENDOSCOPIC RIGHT GREATER SAPHENOUS VEIN HARVEST; CLIPPING OF ATRIAL APPENDAGE WITH 40MM ATRICLIP (Chest) TRANSESOPHAGEAL ECHOCARDIOGRAM CLIPPING OF ATRIAL APPENDAGE  Patient Location: ICU  Anesthesia Type:General  Level of Consciousness: sedated and unresponsive  Airway & Oxygen Therapy: Patient remains intubated per anesthesia plan and Patient placed on Ventilator (see vital sign flow sheet for setting)  Post-op Assessment: Report given to RN and Post -op Vital signs reviewed and stable  Post vital signs: Reviewed and stable  Last Vitals:  Vitals Value Taken Time  BP 97/57 10/05/22 1231  Temp 35.9 C 10/05/22 1234  Pulse 80 10/05/22 1234  Resp 21 10/05/22 1234  SpO2 100 % 10/05/22 1234  Vitals shown include unvalidated device data.  Last Pain:  Vitals:   10/05/22 0328  TempSrc: Oral  PainSc:          Complications: No notable events documented.

## 2022-10-05 NOTE — Brief Op Note (Signed)
10/03/2022 - 10/05/2022  11:03 AM  PATIENT:  Ruben [name]  77 y.o. male  PRE-OPERATIVE DIAGNOSIS:  CORONARY ARTERY DISEASE                        CHRONIC ATRIAL FIBRILLATION  POST-OPERATIVE DIAGNOSIS: CORONARY ARTERY DISEASE            CHRONIC ATRIAL FIBRILLATION PROCEDURE:   CORONARY ARTERY BYPASS GRAFTING (CABG) X 3 BYPASSES USING OPEN LEFT INTERNAL MAMMARY ARTERY AND ENDOSCOPIC RIGHT GREATER SAPHENOUS VEIN HARVEST  LIMA-LAD SVG-OM SVG-PDA  Vein harvest time: 61min Vein prep time: 22min  CLIPPING OF ATRIAL APPENDAGE WITH 40MM ATRICLIP   TRANSESOPHAGEAL ECHOCARDIOGRAM   SURGEON:   Gaye Pollack, MD - Primary  PHYSICIAN ASSISTANT: Renesmae Donahey  ASSISTANTS: Page, Ander Purpura, RN, Circulator  Jac Canavan, RN, Scrub Person  Gaylyn Cheers, RN, RN First Assistant   ANESTHESIA:   general  EBL: 27ml  BLOOD ADMINISTERED: None  DRAINS:  Mediastinal and left pleural drains    LOCAL MEDICATIONS USED:  NONE  COUNTS:  Correct  DICTATION: .Dragon Dictation  PLAN OF CARE: Admit to inpatient   PATIENT DISPOSITION:  ICU - intubated and hemodynamically stable.   Delay start of Pharmacological VTE agent (>24hrs) due to surgical blood loss or risk of bleeding: yes

## 2022-10-05 NOTE — Procedures (Addendum)
Extubation Procedure Note  Patient Details:   Name: Ruben [name] DOB: 1945/10/13 MRN: [id number]   Airway Documentation:    Vent end date: 10/05/22 Vent end time: 2043   Evaluation  O2 sats: stable throughout Complications: No apparent complications Patient did tolerate procedure well. Bilateral Breath Sounds: Clear, Diminished   Yes  Pt extubated to 4L [place] per rapid wean protocol. NIF -35 VC 1.2L, cuff leak present prior to extubation, no stridor present post extubation.   Allena Napoleon 10/05/2022, 8:48 PM

## 2022-10-05 NOTE — Progress Notes (Signed)
      [address]       [place] [address]             [phone]        S/p CABG x 3, LAA clip  Intubated, just starting to wake up  BP (!) 100/45   Pulse 80   Temp 99 F (37.2 C)   Resp 14   Ht 5\' 8"  (1.727 m)   Wt 57 kg   SpO2 100%   BMI 19.11 kg/m   23/14 CI 2.3   Intake/Output Summary (Last 24 hours) at 10/05/2022 1742 Last data filed at 10/05/2022 1700 Gross per 24 hour  Intake 4179.87 ml  Output 2299 ml  Net 1880.87 ml   ~400 out from chest tubes, 20 ml last hour  Hct 27, PLT 97K  Doing well  Wean to extubate  Remo Lipps C. Roxan Hockey, MD [facility] [phone]

## 2022-10-05 NOTE — Discharge Instructions (Addendum)
Discharge Instructions:  1. You may shower, please wash incisions daily with soap and water and keep dry.  If you wish to cover wounds with dressing you may do so but please keep clean and change daily.  No tub baths or swimming until incisions have completely healed.  If your incisions become red or develop any drainage please call our office at [phone]  2. No Driving until cleared by Dr. Bartle's office and you are no longer using narcotic pain medications  3. Monitor your weight daily.. Please use the same scale and weigh at same time... If you gain 5-10 lbs in 48 hours with associated lower extremity swelling, please contact our office at [phone]  4. Fever of 101.5 for at least 24 hours with no source, please contact our office at [phone]  5. Activity- up as tolerated, please walk at least 3 times per day.  Avoid strenuous activity, no lifting, pushing, or pulling with your arms over 8-10 lbs for a minimum of 6 weeks  6. If any questions or concerns arise, please do not hesitate to contact our office at [phone]  ____________________________________________________________________________________________________________________________ Information on my medicine - ELIQUIS (apixaban)  This medication education was reviewed with me or my healthcare representative as part of my discharge preparation.  Why was Eliquis prescribed for you? Eliquis was prescribed for you to reduce the risk of a blood clot forming that can cause a stroke if you have a medical condition called atrial fibrillation (a type of irregular heartbeat).  What do You need to know about Eliquis ? Take your Eliquis TWICE DAILY - one tablet in the morning and one tablet in the evening with or without food. If you have difficulty swallowing the tablet whole please discuss with your pharmacist how to take the medication safely.  Take Eliquis exactly as prescribed by your doctor and DO NOT stop taking  Eliquis without talking to the doctor who prescribed the medication.  Stopping may increase your risk of developing a stroke.  Refill your prescription before you run out.  After discharge, you should have regular check-up appointments with your healthcare provider that is prescribing your Eliquis.  In the future your dose may need to be changed if your kidney function or weight changes by a significant amount or as you get older.  What do you do if you miss a dose? If you miss a dose, take it as soon as you remember on the same day and resume taking twice daily.  Do not take more than one dose of ELIQUIS at the same time to make up a missed dose.  Important Safety Information A possible side effect of Eliquis is bleeding. You should call your healthcare provider right away if you experience any of the following: Bleeding from an injury or your nose that does not stop. Unusual colored urine (red or dark brown) or unusual colored stools (red or black). Unusual bruising for unknown reasons. A serious fall or if you hit your head (even if there is no bleeding).  Some medicines may interact with Eliquis and might increase your risk of bleeding or clotting while on Eliquis. To help avoid this, consult your healthcare provider or pharmacist prior to using any new prescription or non-prescription medications, including herbals, vitamins, non-steroidal anti-inflammatory drugs (NSAIDs) and supplements.  This website has more information on Eliquis (apixaban): http://www.eliquis.com/eliquis/home  

## 2022-10-05 NOTE — Anesthesia Procedure Notes (Signed)
Procedure Name: Intubation Date/Time: 10/05/2022 7:39 AM  Performed by: Mariea Clonts, CRNAPre-anesthesia Checklist: Patient identified, Emergency Drugs available, Suction available and Patient being monitored Patient Re-evaluated:Patient Re-evaluated prior to induction Oxygen Delivery Method: Circle System Utilized Preoxygenation: Pre-oxygenation with 100% oxygen Induction Type: IV induction Ventilation: Mask ventilation without difficulty Laryngoscope Size: Miller and 2 Grade View: Grade I Tube type: Oral Tube size: 8.0 mm Number of attempts: 1 Airway Equipment and Method: Stylet and Oral airway Placement Confirmation: ETT inserted through vocal cords under direct vision, positive ETCO2 and breath sounds checked- equal and bilateral Tube secured with: Tape Dental Injury: Teeth and Oropharynx as per pre-operative assessment

## 2022-10-05 NOTE — Progress Notes (Signed)
  Echocardiogram Echocardiogram Transesophageal has been performed.  Ruben [name] 10/05/2022, 8:05 AM

## 2022-10-05 NOTE — Op Note (Signed)
CARDIOVASCULAR SURGERY OPERATIVE NOTE  10/05/2022  Surgeon:  Gaye Pollack, MD  First Assistant: Enid Cutter,  PA-C:   An experienced assistant was required given the complexity of this surgery and the standard of surgical care. The assistant was needed for exposure, dissection, suctioning, retraction of delicate tissues and sutures, instrument exchange and for overall help during this procedure.   Preoperative Diagnosis:  Severe multi-vessel coronary artery disease   Postoperative Diagnosis:  Same   Procedure:  Median Sternotomy Extracorporeal circulation 3.   Coronary artery bypass grafting x 3  Left internal mammary artery graft to the LAD SVG to OM1 SVG to PDA  4.   Endoscopic vein harvest from the right leg 5.   Clipping of left atrial appendage  Anesthesia:  General Endotracheal   Clinical History/Surgical Indication:  The patient is a 77 year old gentleman who presented with NSTEMI. Cath shows severe left main and 3 V CAD with occlusion of the proximal LAD with minimal visualization distally. LVEF normal. He also has a hx of PAF with TIA in the past and refused anticoagulation. I agree with the need for CABG and I would clip the LAA to decrease stroke risk. I discussed the operative procedure with the patient including alternatives, benefits and risks; including but not limited to bleeding, blood transfusion, infection, stroke, myocardial infarction, graft failure, heart block requiring a permanent pacemaker, organ dysfunction, and death.  Dallie Dad understands and agrees to proceed.   Preparation:  The patient was seen in the preoperative holding area and the correct patient, correct operation were confirmed with the patient after reviewing the medical record and catheterization. The consent was signed by me. Preoperative antibiotics were given. A pulmonary arterial  line and radial arterial line were placed by the anesthesia team. The patient was taken back to the operating room and positioned supine on the operating room table. After being placed under general endotracheal anesthesia by the anesthesia team a foley catheter was placed. The neck, chest, abdomen, and both legs were prepped with betadine soap and solution and draped in the usual sterile manner. A surgical time-out was taken and the correct patient and operative procedure were confirmed with the nursing and anesthesia staff.   Cardiopulmonary Bypass:  A median sternotomy was performed. The pericardium was opened in the midline. Right ventricular function appeared normal. The ascending aorta was of normal size and had no palpable plaque. There were no contraindications to aortic cannulation or cross-clamping. The patient was fully systemically heparinized and the ACT was maintained > 400 sec. The proximal aortic arch was cannulated with a 20 F aortic cannula for arterial inflow. Venous cannulation was performed via the right atrial appendage using a two-staged venous cannula. An antegrade cardioplegia/vent cannula was inserted into the mid-ascending aorta. Aortic occlusion was performed with a single cross-clamp. Systemic cooling to 32 degrees Centigrade and topical cooling of the heart with iced saline were used. Hyperkalemic antegrade cold blood cardioplegia was used to induce diastolic arrest and was then given at about 20 minute intervals throughout the period of arrest to maintain myocardial temperature at or below 10 degrees centigrade. A temperature probe was inserted into the interventricular septum and an insulating pad was placed in the pericardium.   Left internal mammary artery harvest:  The left side of the sternum was retracted using the Rultract retractor. The left internal mammary artery was harvested as a pedicle graft. All side branches were clipped. It was a medium-sized vessel of good  quality  with excellent blood flow. It was ligated distally and divided. It was sprayed with topical papaverine solution to prevent vasospasm.   Endoscopic vein harvest:  The right greater saphenous vein was harvested endoscopically through a 2 cm incision medial to the right knee. It was harvested from the upper thigh to below the knee. It was a medium-sized vein of good quality. The side branches were all ligated with 4-0 silk ties.    Coronary arteries:  The coronary arteries were examined.  LAD:  medium sized vessel that was graftable distally just before the last medium sized diagonal branch. The apical portion of the LAD was small and diffusely diseased. LCX:  OM1 was a large graftable vessel with no distal disease RCA:  diffusely diseased with calcific plaque. The PDA was graftable in the mid portion.   Grafts:  LIMA to the LAD: 1.75 mm. It was sewn end to side using 8-0 prolene continuous suture. SVG to OM1:  2.0 mm. It was sewn end to side using 7-0 prolene continuous suture. SVG to PDA:  1.6 mm. It was sewn end to side using 7-0 prolene continuous suture.   The proximal vein graft anastomoses were performed to the mid-ascending aorta using continuous 6-0 prolene suture. Graft markers were placed around the proximal anastomoses.  Ligation of left atrial appendage:   The base of the appendage was measured and a 40 mm Atricure Atri-clip Pro 2 was chosen. This was placed across the base of the LAA without difficulty.     Completion:  The patient was rewarmed to 37 degrees Centigrade. The clamp was removed from the LIMA pedicle and there was rapid warming of the septum and return of ventricular fibrillation. The crossclamp was removed with a time of 66 minutes. There was spontaneous return of sinus rhythm. The distal and proximal anastomoses were checked for hemostasis. The position of the grafts was satisfactory. Two temporary epicardial pacing wires were placed on the right  atrium and two on the right ventricle. The patient was weaned from CPB without difficulty on no inotropes. CPB time was 84 minutes. Cardiac output was 5 LPM. TEE showed normal LV systolic function. Heparin was fully reversed with protamine and the aortic and venous cannulas removed. Hemostasis was achieved. Mediastinal and left pleural drainage tubes were placed. The sternum was closed with  #6 stainless steel wires. The fascia was closed with continuous # 1 vicryl suture. The subcutaneous tissue was closed with 2-0 vicryl continuous suture. The skin was closed with 3-0 vicryl subcuticular suture. All sponge, needle, and instrument counts were reported correct at the end of the case. Dry sterile dressings were placed over the incisions and around the chest tubes which were connected to pleurevac suction. The patient was then transported to the surgical intensive care unit in stable condition.

## 2022-10-05 NOTE — Progress Notes (Signed)
ANTICOAGULATION CONSULT NOTE   Pharmacy Consult for heparin Indication:  CAD awaiting CVTS consult  Allergies  Allergen Reactions   Codeine Nausea Only   Naproxen Sodium Other (See Comments)    Bleeding ulcers   Vital Signs: Temp: 98.4 F (36.9 C) (03/18 0328) Temp Source: Oral (03/18 0328) BP: 137/75 (03/18 0328) Pulse Rate: 67 (03/18 0328)  Labs: Recent Labs    10/02/22 0429 10/02/22 1043 10/02/22 1043 10/02/22 1215 10/03/22 0545 10/04/22 0016 10/05/22 0330  HGB  --   --    < >  --  11.3* 12.1* 12.3*  HCT  --   --   --   --  31.7* 35.4* 34.8*  PLT  --   --   --   --  149* 154 155  HEPARINUNFRC  --  0.66   < >  --  0.57 0.63 0.77*  CREATININE  --   --   --   --  1.11  --   --   TROPONINIHS 1,346* 6,534*  --  6,270*  --   --   --    < > = values in this interval not displayed.     Estimated Creatinine Clearance: 45.7 mL/min (by C-G formula based on SCr of 1.11 mg/dL).   Medical History: Past Medical History:  Diagnosis Date   TIA (transient ischemic attack)     Assessment: 77yo with NSTEMI s/p LHC with mvCAD. Pt remains on IV heparin. Heparin level above goal at 0.77, CBC stable. No overt s/sx of bleeding per RN  Goal of Therapy:  Heparin level 0.3-0.7 units/ml Monitor platelets by anticoagulation protocol: Yes   Plan:  Reduce heparin gtt (~2 units/kg/hr) to 1000 units/hr Heparin level in 8 hours then daily with CBC  Georga Bora, PharmD Clinical Pharmacist 10/05/2022 4:28 AM Please check AMION for all [place] numbers

## 2022-10-05 NOTE — Progress Notes (Signed)
Dr. Roxan Hockey notified of ABG results. Verbal orders given to continue rapid weaning protocol.    Latest Reference Range & Units 10/05/22 20:23  Sample type  ARTERIAL  pH, Arterial 7.35 - 7.45  7.392  pCO2 arterial 32 - 48 mmHg 31.4 (L)  pO2, Arterial 83 - 108 mmHg 174 (H)  TCO2 22 - 32 mmol/L 20 (L)  Acid-base deficit 0.0 - 2.0 mmol/L 5.0 (H)  Bicarbonate 20.0 - 28.0 mmol/L 19.1 (L)  O2 Saturation % 100  Patient temperature  37.2 C  Collection site  art line  Sodium 135 - 145 mmol/L 139  Potassium 3.5 - 5.1 mmol/L 4.3  Calcium Ionized 1.15 - 1.40 mmol/L 1.17  Hemoglobin 13.0 - 17.0 g/dL 8.2 (L)  HCT 39.0 - 52.0 % 24.0 (L)

## 2022-10-05 NOTE — Anesthesia Postprocedure Evaluation (Signed)
Anesthesia Post Note  Patient: BATTAL KETTERING  Procedure(s) Performed: CORONARY ARTERY BYPASS GRAFTING (CABG) X THREE BYPASSES USING OPEN LEFT INTERNAL MAMMARY ARTERY AND ENDOSCOPIC RIGHT GREATER SAPHENOUS VEIN HARVEST; CLIPPING OF ATRIAL APPENDAGE WITH 40MM ATRICLIP (Chest) TRANSESOPHAGEAL ECHOCARDIOGRAM CLIPPING OF ATRIAL APPENDAGE     Patient location during evaluation: SICU Anesthesia Type: General Level of consciousness: sedated Pain management: pain level controlled Vital Signs Assessment: post-procedure vital signs reviewed and stable Respiratory status: patient remains intubated per anesthesia plan Cardiovascular status: stable Postop Assessment: no apparent nausea or vomiting Anesthetic complications: no  No notable events documented.  Last Vitals:  Vitals:   10/05/22 0328 10/05/22 1231  BP: 137/75 (!) 97/57  Pulse: 67 80  Resp: 15 12  Temp: 36.9 C   SpO2: (!) 85% 100%    Last Pain:  Vitals:   10/05/22 0328  TempSrc: Oral  PainSc:                  Addysin Porco,W. EDMOND

## 2022-10-05 NOTE — Anesthesia Procedure Notes (Signed)
Central Venous Catheter Insertion Performed by: Roderic Palau, MD, anesthesiologist Start/End3/18/2024 6:55 AM, 10/05/2022 7:10 AM Patient location: Pre-op. Preanesthetic checklist: patient identified, IV checked, site marked, risks and benefits discussed, surgical consent, monitors and equipment checked, pre-op evaluation, timeout performed and anesthesia consent Hand hygiene performed  and maximum sterile barriers used  PA cath was placed.Swan type:thermodilution PA Cath depth:50 Procedure performed without using ultrasound guided technique. Attempts: 1 Patient tolerated the procedure well with no immediate complications.

## 2022-10-05 NOTE — Hospital Course (Addendum)
Yolonda Kida, MD [name], MD   Chief Complaint:   NSTEMI with LM stenosis   History of Present Illness:     Pt is a very pleasant 77 yo male who has a history of paroxysmal atrial fibrillation and TIA who presented to outside hospital with substernal chest pain. Pt presented with EKG changes and positive troponins and was taken to cath lab and found to have severe LM stenosis and 3VCAD. Echo with EF 60%. Pt was pain free and sent to [facility] for surgical evaluation. Pt has been on heparin infusion and has been pain free. Pt with father with CABG in early 60s and he has been very strict on his diet from 2018 on with fruits and vegetables and no sugar. He refused eliquis for his TIAs and paroxysmal Afib. No issues since 2022 and carotid duplex at that time with no stenosis. Pt with NSTEMI and now with normal LV function and severe LM and 3 VCAD. Pt would best be served with CABG (LAD visualized by mostly collaterals but should be able to be grafted, OM and RCA grafts). Would benefit from LAA occlusion at time of surgery. All the risks and goals and recovery expectations from CABG were discussed and pt agrees to proceed.   Hospital Course:  Mr. Bredow remained stable after the left heart catheterization. He was taken to the OR on 10/05/22 where CABG x 3 was carried out by Dr. Cyndia Bent. Following the procedure he separated from cardiopulmonary bypass without any difficulty and was transferred to the ICU in stable condition. He remained hemodynamically stable and was weaned from the ventilator for extubation without difficulty. Monitoring lines and drainage tubes were removed routinely and he was mobilized. He developed atrial fibrillation that was treated with IV amiodarone loading with conversion back to SR. The amiodarone was continued by mouth. He developed an expected acute blood loss anemia and was transfused with 1 unit PRBC's on post-op day 2. He also was observed to have a mild thrombocytopenia  that did not require transfusion although the Lovenox was held until recovery of the platelet count. He was mobilized early post-op and was walking in the halls prior to transfer out of the ICU on post-op day 3. After transfer to [facility], he continued to progress with mobility.  Remained in stable sinus rhythm.  On postop day 4, diuretic was held due to mild increase in his creatinine.  Platelet count continues to show recovery back to baseline.  He had return of appropriate bowel bladder function.  His incisions are healing with no sign of complication.  He was felt to be ready for discharge to home on this date where his family will be assisting him with recovery.

## 2022-10-05 NOTE — Anesthesia Procedure Notes (Signed)
Central Venous Catheter Insertion Performed by: Roderic Palau, MD, anesthesiologist Start/End3/18/2024 6:55 AM, 10/05/2022 7:10 AM Patient location: Pre-op. Preanesthetic checklist: patient identified, IV checked, site marked, risks and benefits discussed, surgical consent, monitors and equipment checked, pre-op evaluation, timeout performed and anesthesia consent Position: Trendelenburg Lidocaine 1% used for infiltration and patient sedated Hand hygiene performed , maximum sterile barriers used  and Seldinger technique used Catheter size: 8.5 Fr Total catheter length 10. Central line was placed.Sheath introducer Procedure performed using ultrasound guided technique. Ultrasound Notes:anatomy identified, needle tip was noted to be adjacent to the nerve/plexus identified, no ultrasound evidence of intravascular and/or intraneural injection and image(s) printed for medical record Attempts: 1 Following insertion, line sutured, dressing applied and Biopatch. Post procedure assessment: blood return through all ports, free fluid flow and no air  Patient tolerated the procedure well with no immediate complications.

## 2022-10-05 NOTE — Interval H&P Note (Signed)
History and Physical Interval Note:  10/05/2022 7:00 AM  Ruben [name]  has presented today for surgery, with the diagnosis of CAD.  The various methods of treatment have been discussed with the patient and family. After consideration of risks, benefits and other options for treatment, the patient has consented to  Procedure(s): CORONARY ARTERY BYPASS GRAFTING (CABG) (N/A) TRANSESOPHAGEAL ECHOCARDIOGRAM (N/A) as a surgical intervention.  The patient's history has been reviewed, patient examined, no change in status, stable for surgery.  I have reviewed the patient's chart and labs.  Questions were answered to the patient's satisfaction.     Gaye Pollack

## 2022-10-05 NOTE — Discharge Summary (Signed)
Physician Discharge Summary  Patient ID: Ruben [name] MRN: [id number] DOB/AGE: 76/10/1945 76 y.o.  Admit date: 10/03/2022 Discharge date: 10/11/2022 Admission Diagnoses:  Acute NSTE myocardial infarction Chronic atrial fibrillation Dyslipidemia History of gastritis and PUD  Discharge Diagnoses:   Acute NSTE myocardial infarction Chronic atrial fibrillation Dyslipidemia History of gastritis and PUD S/P CABG x 3  S/P application of  left atrial appendage clip Expected acute blood loss anemia Thrombocytopenia Post-op atrial fibrillation  Patient Active Problem List   Diagnosis Date Noted   S/P CABG x 3 10/05/2022   Tachyarrhythmia 10/02/2022   NSTEMI (non-ST elevated myocardial infarction) ([place]) 10/02/2022   Hyperlipidemia 10/02/2022   Atrial fibrillation, chronic ([place]) 10/02/2022   TIA (transient ischemic attack) 10/28/2020   Gastritis 10/28/2020   PUD (peptic ulcer disease) 10/28/2020   Tongue mass 04/15/2017    Discharged Condition: good  Callwood, Loran Senters, MD [name], MD   Chief Complaint:   NSTEMI with LM stenosis   History of Present Illness:     Pt is a very pleasant 77 yo male who has a history of paroxysmal atrial fibrillation and TIA who presented to outside hospital with substernal chest pain. Pt presented with EKG changes and positive troponins and was taken to cath lab and found to have severe LM stenosis and 3VCAD. Echo with EF 60%. Pt was pain free and sent to [facility] for surgical evaluation. Pt has been on heparin infusion and has been pain free. Pt with father with CABG in early 60s and he has been very strict on his diet from 2018 on with fruits and vegetables and no sugar. He refused eliquis for his TIAs and paroxysmal Afib. No issues since 2022 and carotid duplex at that time with no stenosis. Pt with NSTEMI and now with normal LV function and severe LM and 3 VCAD. Pt would best be served with CABG (LAD visualized by mostly collaterals but  should be able to be grafted, OM and RCA grafts). Would benefit from LAA occlusion at time of surgery. All the risks and goals and recovery expectations from CABG were discussed and pt agrees to proceed.   Hospital Course:  Ruben [name] remained stable after the left heart catheterization. He was taken to the OR on 10/05/22 where CABG x 3 was carried out by Dr. Cyndia Bent. Following the procedure he separated from cardiopulmonary bypass without any difficulty and was transferred to the ICU in stable condition. He remained hemodynamically stable and was weaned from the ventilator for extubation without difficulty. Monitoring lines and drainage tubes were removed routinely and he was mobilized. He developed atrial fibrillation that was treated with IV amiodarone loading with conversion back to SR. The amiodarone was continued by mouth.  He did have some recurrence and it was determined that he should be placed on Eliquis for lower stroke risk.  He developed an expected acute blood loss anemia and was transfused with 1 unit PRBC's on post-op day 2.  He has subsequently stabilized and he has also been placed on iron supplement.  Most recent hemoglobin hematocrit dated 10/10/2022 is measured at 12.5/23.2.  He also was observed to have a mild thrombocytopenia that did not require transfusion although the Lovenox was held until recovery of the platelet count. He was mobilized early post-op and was walking in the halls prior to transfer out of the ICU on post-op day 3.After transfer to [facility], he continued to progress with mobility.  Remained in stable sinus rhythm.  On postop  day 4, diuretic was held due to mild increase in his creatinine.  Renal function has subsequently stabilized to the normal range.  Platelet count continues to show recovery back to baseline.  He had return of appropriate bowel bladder function.  His incisions are healing with no sign of complication.  He was felt to be ready for discharge to  home on this date where his family will be assisting him with recovery.  Consults: cardiology  Significant Diagnostic Studies:  DG Chest Port 1 View  Result Date: 10/07/2022 CLINICAL DATA:  History of CABG. EXAM: PORTABLE CHEST 1 VIEW COMPARISON:  10/06/2022 FINDINGS: Pulmonary artery catheter has been removed. Chest tubes have been removed. Right jugular central line is still present. Postsurgical changes compatible with CABG procedure and left atrial appendage clipping. Patchy densities at the medial left lung base are suggestive for atelectasis. Slightly increased densities in the retrocardiac space. Negative for a pneumothorax. IMPRESSION: 1. Removal of chest tubes and pulmonary artery catheter. Negative for pneumothorax. 2. Increased densities at the left lung base. Findings are suggestive for atelectasis. Electronically Signed   By: Markus Daft M.D.   On: 10/07/2022 08:18   DG Chest Port 1 View  Result Date: 10/06/2022 CLINICAL DATA:  Status post coronary bypass graft. EXAM: PORTABLE CHEST 1 VIEW COMPARISON:  October 05, 2022. FINDINGS: The heart size and mediastinal contours are within normal limits. Endotracheal and nasogastric tubes have been removed. Right internal jugular Swan-Ganz catheter is unchanged. Left-sided chest tube is noted with minimal left apical pneumothorax. Right lung is clear. The visualized skeletal structures are unremarkable. IMPRESSION: Left-sided chest tube with minimal left apical pneumothorax. Electronically Signed   By: Marijo Conception M.D.   On: 10/06/2022 08:17   DG Chest Port 1 View  Result Date: 10/05/2022 CLINICAL DATA:  Post CABG x3 EXAM: PORTABLE CHEST 1 VIEW COMPARISON:  Portable exam 1247 hours compared to 10/04/2022 FINDINGS: Tip of endotracheal tube projects 5.0 cm above carina. Nasogastric tube extends into stomach. RIGHT jugular Swan-Ganz catheter tip projects over main pulmonary artery. Epicardial pacing wires present. Mediastinal drains and LEFT  thoracostomy tube noted. Normal heart size post median sternotomy, CABG, and LEFT atrial appendage clipping. Mediastinal contours and pulmonary vascularity normal. Atherosclerotic calcification aorta. Minimal LEFT basilar atelectasis. Trace LEFT apex pneumothorax. Calcified granuloma lower RIGHT chest. IMPRESSION: Postoperative changes of CABG as above. Trace LEFT apex pneumothorax despite thoracostomy tube. Electronically Signed   By: Lavonia Dana M.D.   On: 10/05/2022 13:15   ECHO INTRAOPERATIVE TEE  Result Date: 10/05/2022  *INTRAOPERATIVE TRANSESOPHAGEAL REPORT *  Patient Name:   TRAYCE KEES Date of Exam: 10/05/2022 Medical Rec #:  [id number]        Height:       68.0 in Accession #:    [id number]       Weight:       125.7 lb Date of Birth:  Feb 28, 1946       BSA:          1.68 m Patient Age:    42 years         BP:           157/67 mmHg Patient Gender: M                HR:           89 bpm. Exam Location:  Anesthesiology Transesophogeal exam was perform intraoperatively during surgical procedure. Patient was closely monitored under general anesthesia during the entirety of  examination. Indications:     I25.110 Atherosclerotic heart disease of native coronary artery                  with unstable angina pectoris Sonographer:     Roseanna Rainbow RDCS Performing Phys: [id number] Pollack Diagnosing Phys: Roderic Palau MD Complications: No known complications during this procedure. POST-OP IMPRESSIONS Overall, there were no significant changes from pre-bypass. PRE-OP FINDINGS  Left Ventricle: The left ventricle has normal systolic function, with an ejection fraction of 60-65%. The cavity size was normal. There is moderately increased left ventricular wall thickness. No evidence of left ventricular regional wall motion abnormalities. There is moderate concentric left ventricular hypertrophy. Right Ventricle: The right ventricle has normal systolic function. The cavity was normal. There is no increase in right  ventricular wall thickness. Left Atrium: Left atrial size was not assessed. No left atrial/left atrial appendage thrombus was detected. Right Atrium: Right atrial size was not assessed. Interatrial Septum: No atrial level shunt detected by color flow Doppler. Pericardium: There is no evidence of pericardial effusion. Mitral Valve: The mitral valve is normal in structure. Mitral valve regurgitation is not visualized by color flow Doppler. Tricuspid Valve: The tricuspid valve was normal in structure. Tricuspid valve regurgitation was not visualized by color flow Doppler. Aortic Valve: The aortic valve is normal in structure. Aortic valve regurgitation was not visualized by color flow Doppler. Pulmonic Valve: The pulmonic valve was normal in structure. Pulmonic valve regurgitation is mild by color flow Doppler.  Roderic Palau MD Electronically signed by Roderic Palau MD Signature Date/Time: 10/05/2022/12:55:36 PM    Final    EP STUDY  Result Date: 10/05/2022 See surgical note for result.  DG Chest 2 View  Result Date: 10/04/2022 CLINICAL DATA:  Preop evaluation for coronary bypass grafting EXAM: CHEST - 2 VIEW COMPARISON:  10/02/2022 FINDINGS: Cardiac shadow is within normal limits. Lungs are well aerated bilaterally. No focal infiltrate or sizable effusion is seen. No acute bony abnormality is noted. IMPRESSION: No active cardiopulmonary disease. Electronically Signed   By: Inez Catalina M.D.   On: 10/04/2022 12:10   VAS US CAROTID  Result Date: 10/04/2022 Carotid Arterial Duplex Study Patient Name:  KARY FRONTINO  Date of Exam:   10/03/2022 Medical Rec #: [id number]         Accession #:    [id number] Date of Birth: 26-Apr-1946        Patient Gender: M Patient Age:   73 years Exam Location:  [facility] Procedure:      VAS US CAROTID Referring Phys: Lars Pinks --------------------------------------------------------------------------------  Indications:       TIA and pre-op. Risk  Factors:      Hypertension, no history of smoking, coronary artery                    disease. Comparison Study:  Previous 10/28/20 Performing Technologist: McKayla Maag RVT, [place]  Examination Guidelines: A complete evaluation includes B-mode imaging, spectral Doppler, color Doppler, and power Doppler as needed of all accessible portions of each vessel. Bilateral testing is considered an integral part of a complete examination. Limited examinations for reoccurring indications may be performed as noted.  Right Carotid Findings: +---------+--------+-------+--------+------------------------+-----------------+          PSV cm/sEDV    StenosisPlaque Description      Comments  cm/s                                                     +---------+--------+-------+--------+------------------------+-----------------+ CCA Prox 64      10     <50%    heterogenous and                                                          irregular                                 +---------+--------+-------+--------+------------------------+-----------------+ CCA      67      11                                     intimal           Distal                                                  thickening        +---------+--------+-------+--------+------------------------+-----------------+ ICA Prox 66      12     1-39%   heterogenous and                                                          irregular                                 +---------+--------+-------+--------+------------------------+-----------------+ ICA Mid  78      15                                                       +---------+--------+-------+--------+------------------------+-----------------+ ICA      107     25                                                       Distal                                                                     +---------+--------+-------+--------+------------------------+-----------------+ ECA      94  10                                                       +---------+--------+-------+--------+------------------------+-----------------+ +----------+--------+-------+----------------+-------------------+           PSV cm/sEDV cmsDescribe        Arm Pressure (mmHG) +----------+--------+-------+----------------+-------------------+ [id number]            Multiphasic, WNL                    +----------+--------+-------+----------------+-------------------+ +---------+--------+--+--------+-+---------+ VertebralPSV cm/s34EDV cm/s8Antegrade +---------+--------+--+--------+-+---------+  Left Carotid Findings: +---------+--------+-------+--------+------------------------+-----------------+          PSV cm/sEDV    StenosisPlaque Description      Comments                           cm/s                                                     +---------+--------+-------+--------+------------------------+-----------------+ CCA Prox 71      10                                     intimal                                                                   thickening        +---------+--------+-------+--------+------------------------+-----------------+ CCA      76      11                                     intimal           Distal                                                  thickening        +---------+--------+-------+--------+------------------------+-----------------+ ICA Prox 48      7      1-39%   heterogenous and                                                          irregular                                 +---------+--------+-------+--------+------------------------+-----------------+ ICA Mid  48      8                                                         +---------+--------+-------+--------+------------------------+-----------------+  ICA      86      27                                                       Distal                                                                    +---------+--------+-------+--------+------------------------+-----------------+ ECA      82      8                                                        +---------+--------+-------+--------+------------------------+-----------------+ +----------+--------+--------+----------------+-------------------+           PSV cm/sEDV cm/sDescribe        Arm Pressure (mmHG) +----------+--------+--------+----------------+-------------------+ Subclavian140             Multiphasic, WNL                    +----------+--------+--------+----------------+-------------------+ +---------+--------+--+--------+--+---------+ VertebralPSV cm/s70EDV cm/s14Antegrade +---------+--------+--+--------+--+---------+   Summary: Right Carotid: Velocities in the right ICA are consistent with a 1-39% stenosis.                Non-hemodynamically significant plaque <50% noted in the CCA. Left Carotid: Velocities in the left ICA are consistent with a 1-39% stenosis. Vertebrals:  Bilateral vertebral arteries demonstrate antegrade flow. Subclavians: Normal flow hemodynamics were seen in bilateral subclavian              arteries. *See table(s) above for measurements and observations.  Electronically signed by Servando Snare MD on 10/04/2022 at 10:36:53 AM.    Final    CT CHEST WO CONTRAST  Result Date: 10/04/2022 CLINICAL DATA:  Status post none ST elevation myocardial infarction. Severe multi-vessel coronary artery disease. Hyperlipidemia. EXAM: CT CHEST WITHOUT CONTRAST TECHNIQUE: Multidetector CT imaging of the chest was performed following the standard protocol without IV contrast. RADIATION DOSE REDUCTION: This exam was performed according to the departmental dose-optimization program  which includes automated exposure control, adjustment of the mA and/or kV according to patient size and/or use of iterative reconstruction technique. COMPARISON:  None Available. FINDINGS: Cardiovascular: Heart size appears normal. Aortic atherosclerosis and 3 vessel coronary artery calcifications. No pericardial effusion. Mediastinum/Nodes: No enlarged mediastinal or axillary lymph nodes. Thyroid gland, trachea, and esophagus demonstrate no significant findings. Lungs/Pleura: No pleural effusion, airspace consolidation, atelectasis, or pneumothorax. Scarring noted within the posterior lung bases. No suspicious pulmonary nodule or mass. Upper Abdomen: No acute abnormality. 1.3 cm ill-defined low-density structure is noted within the posterior right lobe of liver, image 413/4. Gallstones identified use left 10 the jaw all stable once the gains with lost Musculoskeletal: No chest wall mass or suspicious bone lesions identified. IMPRESSION: 1. No acute cardiopulmonary abnormalities. 2. Coronary artery calcifications. 3. Gallstones. 4. 1.3 cm ill-defined low-density structure is noted within the posterior right lobe of liver. This is incompletely characterized on this exam. In the  absence of a known malignancy this is likely benign. Consider follow-up imaging with nonemergent contrast enhanced outpatient liver MRI for more definitive characterization could. 5.  Aortic Atherosclerosis (ICD10-I70.0). Electronically Signed   By: Kerby Moors M.D.   On: 10/04/2022 07:56   CARDIAC CATHETERIZATION  Result Date: 10/02/2022   Ost LM to Mid LM lesion is 75% stenosed.   Ost RCA to Prox RCA lesion is 90% stenosed.   Dist LM to Ost LAD lesion is 100% stenosed.   Ost Cx to Dist Cx lesion is 50% stenosed.   Prox RCA to Mid RCA lesion is 50% stenosed.   The left ventricular systolic function is normal.   LV end diastolic pressure is normal.   The left ventricular ejection fraction is 50-55% by visual estimate.   There is no  mitral valve regurgitation. Conclusion Status post non-STEMI presentation Left heart cath right radial approach Left ventriculogram showed preserved left ventricular function EF of at least 55% Coronaries Left main large ostial 75 hazy calcified lesion LAD 100% occluded ostially TIMI I flow IRA Circumflex medium to small diffuse 50% RCA large 90% proximal 50% mid TIMI-3 flow Right dominant system Faint collaterals Intervention deferred not indicated Recommend coronary bypass surgery at a tertiary care center  ECHOCARDIOGRAM COMPLETE  Result Date: 10/02/2022    ECHOCARDIOGRAM REPORT   Patient Name:   OLIS SWANNER Date of Exam: 10/02/2022 Medical Rec #:  [id number]        Height:       68.0 in Accession #:    [id number]       Weight:       185.0 lb Date of Birth:  1946-02-08       BSA:          1.977 m Patient Age:    41 years         BP:           124/71 mmHg Patient Gender: M                HR:           56 bpm. Exam Location:  [facility] Procedure: 2D Echo, Cardiac Doppler and Color Doppler Indications:     NSTEMI  History:         Patient has prior history of Echocardiogram examinations, most                  recent 10/29/2020. Acute MI, TIA; Arrythmias:Tachycardia.  Sonographer:     Wenda Low Referring Phys:  [id number] [place] TANG Diagnosing Phys: Yolonda Kida MD  Sonographer Comments: Technically difficult study due to poor echo windows and suboptimal parasternal window. IMPRESSIONS  1. Left ventricular ejection fraction, by estimation, is 60 to 65%. The left ventricle has normal function. The left ventricle has no regional wall motion abnormalities. Left ventricular diastolic parameters are consistent with Grade I diastolic dysfunction (impaired relaxation).  2. Right ventricular systolic function is normal. The right ventricular size is normal. There is normal pulmonary artery systolic pressure.  3. The mitral valve is normal in structure. Trivial mitral valve regurgitation.  4. The aortic  valve is normal in structure. Aortic valve regurgitation is not visualized. FINDINGS  Left Ventricle: Left ventricular ejection fraction, by estimation, is 60 to 65%. The left ventricle has normal function. The left ventricle has no regional wall motion abnormalities. The left ventricular internal cavity size was normal in size. There is  no left ventricular hypertrophy. Left ventricular diastolic  parameters are consistent with Grade I diastolic dysfunction (impaired relaxation). Right Ventricle: The right ventricular size is normal. No increase in right ventricular wall thickness. Right ventricular systolic function is normal. There is normal pulmonary artery systolic pressure. The tricuspid regurgitant velocity is 1.59 m/s, and  with an assumed right atrial pressure of 3 mmHg, the estimated right ventricular systolic pressure is [id number]. Left Atrium: Left atrial size was normal in size. Right Atrium: Right atrial size was normal in size. Pericardium: There is no evidence of pericardial effusion. Mitral Valve: The mitral valve is normal in structure. There is moderate thickening of the mitral valve leaflet(s). Normal mobility of the mitral valve leaflets. Trivial mitral valve regurgitation. MV peak gradient, 3.0 mmHg. The mean mitral valve gradient is 1.0 mmHg. Tricuspid Valve: The tricuspid valve is normal in structure. Tricuspid valve regurgitation is trivial. Aortic Valve: The aortic valve is normal in structure. Aortic valve regurgitation is not visualized. Aortic valve mean gradient measures 2.0 mmHg. Aortic valve peak gradient measures 3.2 mmHg. Aortic valve area, by VTI measures 2.51 cm. Pulmonic Valve: The pulmonic valve was normal in structure. Pulmonic valve regurgitation is not visualized. Aorta: The ascending aorta was not well visualized. IAS/Shunts: No atrial level shunt detected by color flow Doppler.  LEFT VENTRICLE PLAX 2D LVIDd:         4.30 cm   Diastology LVIDs:         2.00 cm   LV e'  medial:    7.18 cm/s LV PW:         1.00 cm   LV E/e' medial:  9.5 LV IVS:        0.80 cm   LV e' lateral:   8.70 cm/s LVOT diam:     1.90 cm   LV E/e' lateral: 7.8 LV SV:         50 LV SV Index:   25 LVOT Area:     2.84 cm  RIGHT VENTRICLE RV Basal diam:  2.85 cm RV Mid diam:    2.80 cm RV S prime:     15.60 cm/s TAPSE (M-mode): 2.2 cm LEFT ATRIUM             Index        RIGHT ATRIUM           Index LA diam:        3.20 cm 1.62 cm/m   RA Area:     12.10 cm LA Vol (A2C):   64.3 ml 32.52 ml/m  RA Volume:   25.80 ml  13.05 ml/m LA Vol (A4C):   77.9 ml 39.40 ml/m LA Biplane Vol: 71.3 ml 36.06 ml/m  AORTIC VALVE                    PULMONIC VALVE AV Area (Vmax):    2.57 cm     PV Vmax:       0.63 m/s AV Area (Vmean):   2.44 cm     PV Peak grad:  1.6 mmHg AV Area (VTI):     2.51 cm AV Vmax:           88.80 cm/s AV Vmean:          61.300 cm/s AV VTI:            0.199 m AV Peak Grad:      3.2 mmHg AV Mean Grad:      2.0 mmHg LVOT Vmax:  80.60 cm/s LVOT Vmean:        52.700 cm/s LVOT VTI:          0.176 m LVOT/AV VTI ratio: 0.88  AORTA Ao Root diam: 3.10 cm MITRAL VALVE               TRICUSPID VALVE MV Area (PHT): 2.74 cm    TR Peak grad:   10.1 mmHg MV Area VTI:   1.68 cm    TR Vmax:        159.00 cm/s MV Peak grad:  3.0 mmHg MV Mean grad:  1.0 mmHg    SHUNTS MV Vmax:       0.87 m/s    Systemic VTI:  0.18 m MV Vmean:      38.9 cm/s   Systemic Diam: 1.90 cm MV Decel Time: 277 msec MV E velocity: 68.10 cm/s MV A velocity: 84.90 cm/s MV E/A ratio:  0.80 Dwayne D Callwood MD Electronically signed by Yolonda Kida MD Signature Date/Time: 10/02/2022/1:47:29 PM    Final    DG Chest Port 1 View  Result Date: 10/02/2022 CLINICAL DATA:  Palpitations EXAM: PORTABLE CHEST 1 VIEW COMPARISON:  None Available. FINDINGS: The heart size and mediastinal contours are within normal limits. Both lungs are clear. The visualized skeletal structures are unremarkable. IMPRESSION: No active disease. Electronically Signed    By: Placido Sou M.D.   On: 10/02/2022 02:46     Results for orders placed or performed during the hospital encounter of 10/03/22 (from the past 72 hour(s))  Basic metabolic panel     Status: Abnormal   Collection Time: 10/10/22 12:55 AM  Result Value Ref Range   Sodium 138 135 - 145 mmol/L   Potassium 4.0 3.5 - 5.1 mmol/L   Chloride 107 98 - 111 mmol/L   CO2 22 22 - 32 mmol/L   Glucose, Bld 95 70 - 99 mg/dL    Comment: Glucose reference range applies only to samples taken after fasting for at least 8 hours.   BUN 18 8 - 23 mg/dL   Creatinine, Ser 1.15 0.61 - 1.24 mg/dL   Calcium 8.2 (L) 8.9 - 10.3 mg/dL   GFR, Estimated >60 >60 mL/min    Comment: (NOTE) Calculated using the CKD-EPI Creatinine Equation (2021)    Anion gap 9 5 - 15    Comment: Performed at [facility] [address], [place], [place] [address]  CBC     Status: Abnormal   Collection Time: 10/10/22 12:55 AM  Result Value Ref Range   WBC 5.9 4.0 - 10.5 K/uL   RBC 2.39 (L) 4.22 - 5.81 MIL/uL   Hemoglobin 7.9 (L) 13.0 - 17.0 g/dL   HCT 23.2 (L) 39.0 - 52.0 %   MCV 97.1 80.0 - 100.0 fL   MCH 33.1 26.0 - 34.0 pg   MCHC 34.1 30.0 - 36.0 g/dL   RDW 21.1 (H) 11.5 - 15.5 %   Platelets 138 (L) 150 - 400 K/uL   nRBC 0.0 0.0 - 0.2 %    Comment: Performed at [facility], [address] [place], [place] [address]       Treatments:  CARDIOVASCULAR SURGERY OPERATIVE NOTE   10/05/2022   Surgeon:  Gaye Pollack, MD   First Assistant: Enid Cutter,  PA-C:     Preoperative Diagnosis:  Severe multi-vessel coronary artery disease     Postoperative Diagnosis:  Same     Procedure:   Median Sternotomy Extracorporeal circulation 3.   Coronary artery bypass grafting x 3   Left internal mammary artery graft to the LAD SVG to OM1 SVG to PDA   4.   Endoscopic vein harvest from the right leg 5.    Clipping of left atrial appendage   Anesthesia:  General Endotracheal    Discharge Exam: Blood pressure (!) 138/55, pulse 69, temperature 98 F (36.7 C), temperature source Oral, resp. rate 20, height 5\' 8"  (1.727 m), weight 59.1 kg, SpO2 100 %.    General appearance: alert, cooperative, and no distress Heart: regular rate and rhythm Lungs: mildly dim in bases Abdomen: benign Extremities: no edema Wound: incis healing well   Disposition: Discharge disposition: 01-Home or Self Care       Discharge Instructions     Amb Referral to Cardiac Rehabilitation   Complete by: As directed    Diagnosis: CABG   CABG X ___: 3   After initial evaluation and assessments completed: Virtual Based Care may be provided alone or in conjunction with Phase 2 Cardiac Rehab based on patient barriers.: Yes   Intensive Cardiac Rehabilitation (ICR) [place] location only OR Traditional Cardiac Rehabilitation (TCR) *If criteria for ICR are not met will enroll in TCR [facility] only): Yes   Discharge patient   Complete by: As directed    Discharge disposition: 01-Home or Self Care   Discharge patient date: 10/11/2022      Allergies as of 10/11/2022       Reactions   Codeine Nausea Only   Naproxen Sodium Other (See Comments)   Bleeding ulcers        Medication List     STOP taking these medications    diphenhydramine-acetaminophen 25-500 MG Tabs tablet Commonly known as: TYLENOL PM   heparin 25000 UT/250ML infusion       TAKE these medications    amiodarone 200 MG tablet Commonly known as: PACERONE Take 1 tablet (200 mg total) by mouth 2 (two) times daily. For 7 days then reduce the dose to 1 tablet (200 mg) twice daily.   apixaban 5 MG Tabs tablet Commonly known as: ELIQUIS Take 1 tablet (5 mg total) by mouth 2 (two) times daily.   aspirin EC 81 MG tablet Take 1 tablet (81 mg total) by mouth daily. Swallow whole. What changed:  how much to take when to take this reasons to take  this additional instructions   cholecalciferol 25 MCG (1000 UNIT) tablet Commonly known as: VITAMIN D3 Take 1,000 Units by mouth daily.   cyanocobalamin 1000 MCG tablet Commonly known as: VITAMIN B12 Take 1,000 mcg by mouth daily.   Fe Fum-Vit C-Vit B12-FA Caps capsule Commonly known as: TRIGELS-F FORTE Take 1 capsule by mouth daily after breakfast.   metoprolol tartrate 25 MG tablet Commonly known as: LOPRESSOR Take 1 tablet (25 mg total) by mouth 2 (two) times daily.   REFRESH OP Place 1 drop into both eyes as needed (dry eye).   rosuvastatin 20 MG tablet Commonly known as: CRESTOR Take 2 tablets (40 mg total) by mouth at bedtime. What changed: Another medication with the same name was removed. Continue taking this medication, and follow the directions you see here.  traMADol 50 MG tablet Commonly known as: ULTRAM Take 1 tablet (50 mg total) by mouth every 6 (six) hours as needed for up to 7 days for moderate pain.   vitamin C 250 MG tablet Commonly known as: ASCORBIC ACID Take 250 mg by mouth daily.        Follow-up Information     [facility]. Go on 10/19/2022.   Specialty: Cardiothoracic Surgery Why: Your appointment time for suture removal is 11am. Contact information: [place], [place]        Gaye Pollack, MD. Go on 11/04/2022.   Specialty: Cardiothoracic Surgery Why: Your appointment time is 9:30am. Please obtain a chest x-ray 1 hour before the appointment at [facility] located at [address] Contact information: [address] [place] [address] [phone]         Isaias Cowman, MD. Daphane Shepherd on 10/22/2022.   Specialty: Cardiology Why: Your follow-up cardiology appointment time is 10 AM. Contact information: [facility] [place] [address] [phone]          [facility] Follow up.   Specialty: Home Health Services Why: Latricia Heft)- [facility] referral pre-op from MD office - they will contact you post discharge to follow up and schedule Contact information: [place] [id number] [phone]         [facility] Follow up.   Why: ([facility])- rolling walker arranged- to be delivered to room prior to discharge Contact information: [address] [place] [address] [phone]                 The patient has been discharged on:   1.Beta Blocker:  Yes [ x ]                              No   [   ]                              If No, reason:  2.Ace Inhibitor/ARB: Yes [   ]                                     No  [ x]                                     If No, reason:Soft BP, starting beta blocker  3.Statin:   Yes [ x ]                  No  [   ]                  If No, reason:  4.Ecasa:  Yes  [ x ]                  No   [   ]                  If No, reason:  5. ACS on Admission?  Yes  P2Y12 Inhibitor:  Yes  [  ]  No  [ x ]- was placed on apixiban so plavix felt not to be necessary    Signed: John Giovanni, PA-C 10/12/2022, 10:18 AM

## 2022-10-05 NOTE — Plan of Care (Signed)
  Problem: Education: Goal: Knowledge of General Education information will improve Description: Including pain rating scale, medication(s)/side effects and non-pharmacologic comfort measures Outcome: Progressing   Problem: Health Behavior/Discharge Planning: Goal: Ability to manage health-related needs will improve Outcome: Progressing   Problem: Nutrition: Goal: Adequate nutrition will be maintained Outcome: Progressing   

## 2022-10-05 NOTE — Progress Notes (Signed)
Pt extubated at 2043. RT at bedside.

## 2022-10-06 ENCOUNTER — Inpatient Hospital Stay (HOSPITAL_COMMUNITY): Payer: PPO

## 2022-10-06 DIAGNOSIS — Z1152 Encounter for screening for COVID-19: Secondary | ICD-10-CM | POA: Diagnosis not present

## 2022-10-06 DIAGNOSIS — I214 Non-ST elevation (NSTEMI) myocardial infarction: Secondary | ICD-10-CM | POA: Diagnosis not present

## 2022-10-06 DIAGNOSIS — I25110 Atherosclerotic heart disease of native coronary artery with unstable angina pectoris: Secondary | ICD-10-CM | POA: Diagnosis not present

## 2022-10-06 DIAGNOSIS — D696 Thrombocytopenia, unspecified: Secondary | ICD-10-CM | POA: Diagnosis not present

## 2022-10-06 LAB — BASIC METABOLIC PANEL
Anion gap: 11 (ref 5–15)
Anion gap: 6 (ref 5–15)
BUN: 15 mg/dL (ref 8–23)
BUN: 19 mg/dL (ref 8–23)
CO2: 20 mmol/L — ABNORMAL LOW (ref 22–32)
CO2: 24 mmol/L (ref 22–32)
Calcium: 7.9 mg/dL — ABNORMAL LOW (ref 8.9–10.3)
Calcium: 8 mg/dL — ABNORMAL LOW (ref 8.9–10.3)
Chloride: 105 mmol/L (ref 98–111)
Chloride: 105 mmol/L (ref 98–111)
Creatinine, Ser: 1.06 mg/dL (ref 0.61–1.24)
Creatinine, Ser: 1.1 mg/dL (ref 0.61–1.24)
GFR, Estimated: >60 mL/min (ref >60)
GFR, Estimated: >60 mL/min (ref >60)
Glucose, Bld: 146 mg/dL — ABNORMAL HIGH (ref 70–99)
Glucose, Bld: 146 mg/dL — ABNORMAL HIGH (ref 70–99)
Potassium: 4.3 mmol/L (ref 3.5–5.1)
Potassium: 3.8 mmol/L (ref 3.5–5.1)
Sodium: 136 mmol/L (ref 135–145)
Sodium: 135 mmol/L (ref 135–145)

## 2022-10-06 LAB — CBC
HCT: 22.4 % — ABNORMAL LOW (ref 39.0–52.0)
HCT: 20.7 % — ABNORMAL LOW (ref 39.0–52.0)
Hemoglobin: 7.9 g/dL — ABNORMAL LOW (ref 13.0–17.0)
Hemoglobin: 7.2 g/dL — ABNORMAL LOW (ref 13.0–17.0)
MCH: 36.6 pg — ABNORMAL HIGH (ref 26.0–34.0)
MCH: 36.4 pg — ABNORMAL HIGH (ref 26.0–34.0)
MCHC: 35.3 g/dL (ref 30.0–36.0)
MCHC: 34.8 g/dL (ref 30.0–36.0)
MCV: 103.7 fL — ABNORMAL HIGH (ref 80.0–100.0)
MCV: 104.5 fL — ABNORMAL HIGH (ref 80.0–100.0)
Platelets: 93 10*3/uL — ABNORMAL LOW (ref 150–400)
Platelets: 89 10*3/uL — ABNORMAL LOW (ref 150–400)
RBC: 2.16 MIL/uL — ABNORMAL LOW (ref 4.22–5.81)
RBC: 1.98 MIL/uL — ABNORMAL LOW (ref 4.22–5.81)
RDW: 12.3 % (ref 11.5–15.5)
RDW: 12.8 % (ref 11.5–15.5)
WBC: 6.5 10*3/uL (ref 4.0–10.5)
WBC: 6.9 10*3/uL (ref 4.0–10.5)
nRBC: 0 % (ref 0.0–0.2)
nRBC: 0 % (ref 0.0–0.2)

## 2022-10-06 LAB — GLUCOSE, CAPILLARY
Glucose-Capillary: 121 mg/dL — ABNORMAL HIGH (ref 70–99)
Glucose-Capillary: 140 mg/dL — ABNORMAL HIGH (ref 70–99)
Glucose-Capillary: 112 mg/dL — ABNORMAL HIGH (ref 70–99)
Glucose-Capillary: 167 mg/dL — ABNORMAL HIGH (ref 70–99)
Glucose-Capillary: 117 mg/dL — ABNORMAL HIGH (ref 70–99)
Glucose-Capillary: 125 mg/dL — ABNORMAL HIGH (ref 70–99)
Glucose-Capillary: 108 mg/dL — ABNORMAL HIGH (ref 70–99)

## 2022-10-06 LAB — MAGNESIUM
Magnesium: 2.4 mg/dL (ref 1.7–2.4)
Magnesium: 2.3 mg/dL (ref 1.7–2.4)

## 2022-10-06 MED ORDER — ENOXAPARIN SODIUM 40 MG/0.4ML IJ SOSY
40.0000 mg | PREFILLED_SYRINGE | Freq: Every day | INTRAMUSCULAR | Status: DC
  Administered 2022-10-06 – 2022-10-07 (×2): 40 mg via SUBCUTANEOUS
  Filled 2022-10-06 (×2): qty 0.4

## 2022-10-06 MED ORDER — POTASSIUM CHLORIDE CRYS ER 20 MEQ PO TBCR
20.0000 meq | EXTENDED_RELEASE_TABLET | Freq: Two times a day (BID) | ORAL | Status: AC
  Administered 2022-10-06 (×2): 20 meq via ORAL
  Filled 2022-10-06 (×2): qty 1

## 2022-10-06 MED ORDER — INSULIN ASPART 100 UNIT/ML IJ SOLN
0.0000 [IU] | INTRAMUSCULAR | Status: DC
  Administered 2022-10-06: 4 [IU] via SUBCUTANEOUS
  Administered 2022-10-06 – 2022-10-07 (×2): 2 [IU] via SUBCUTANEOUS

## 2022-10-06 MED ORDER — TRAMADOL HCL 50 MG PO TABS: 50.0000 mg | ORAL_TABLET | ORAL | Status: DC | PRN

## 2022-10-06 MED ORDER — FUROSEMIDE 10 MG/ML IJ SOLN
40.0000 mg | Freq: Two times a day (BID) | INTRAMUSCULAR | Status: AC
  Administered 2022-10-06 (×2): 40 mg via INTRAVENOUS
  Filled 2022-10-06 (×2): qty 4

## 2022-10-06 MED ORDER — FE FUM-VIT C-VIT B12-FA 460-60-0.01-1 MG PO CAPS
1.0000 | ORAL_CAPSULE | Freq: Every day | ORAL | Status: DC
  Administered 2022-10-06 – 2022-10-11 (×6): 1 via ORAL
  Filled 2022-10-06 (×6): qty 1

## 2022-10-06 MED FILL — Potassium Chloride Inj 2 mEq/ML: INTRAVENOUS | Qty: 40 | Status: AC

## 2022-10-06 MED FILL — Heparin Sodium (Porcine) Inj 1000 Unit/ML: Qty: 1000 | Status: AC

## 2022-10-06 MED FILL — Magnesium Sulfate Inj 50%: INTRAMUSCULAR | Qty: 10 | Status: AC

## 2022-10-06 MED FILL — Thrombin (Recombinant) For Soln 20000 Unit: CUTANEOUS | Qty: 1 | Status: AC

## 2022-10-06 NOTE — Progress Notes (Signed)
EVENING ROUNDS NOTE :     [address]       [place] [address]             [phone]                 1 Day Post-Op Procedure(s) (LRB): CORONARY ARTERY BYPASS GRAFTING (CABG) X THREE BYPASSES USING OPEN LEFT INTERNAL MAMMARY ARTERY AND ENDOSCOPIC RIGHT GREATER SAPHENOUS VEIN HARVEST; CLIPPING OF ATRIAL APPENDAGE WITH 40MM ATRICLIP (N/A) TRANSESOPHAGEAL ECHOCARDIOGRAM (N/A) CLIPPING OF ATRIAL APPENDAGE   Total Length of Stay:  LOS: 3 days  Events:   Resting comfortably Stable day    BP (!) 110/46   Pulse 78   Temp 98.1 F (36.7 C) (Oral)   Resp (!) 21   Ht 5\' 8"  (1.727 m)   Wt 61.2 kg   SpO2 96%   BMI 20.51 kg/m   PAP: (11-25)/(2-14) 13/3 CO:  [3.5 L/min-4.4 L/min] 4.1 L/min CI:  [2.1 L/min/m2-2.6 L/min/m2] 2.4 L/min/m2  Vent Mode: PSV;CPAP FiO2 (%):  [40 %] 40 % Set Rate:  [4 bmp] 4 bmp PEEP:  [5 cmH20] 5 cmH20 Pressure Support:  [10 cmH20] 10 cmH20   sodium chloride     sodium chloride 10 mL/hr at 10/06/22 1300   sodium chloride      ceFAZolin (ANCEF) IV 2 g (10/06/22 1339)   lactated ringers     lactated ringers Stopped (10/06/22 0626)   nitroGLYCERIN     phenylephrine (NEO-SYNEPHRINE) Adult infusion 18 mcg/min (10/06/22 0400)    I/O last 3 completed shifts: In: 4990.2 [I.V.:3091.4; Blood:310; IV Piggyback:1588.8] Out: 3289 [Urine:2175; Blood:564; Chest Tube:550]      Latest Ref Rng & Units 10/06/2022    3:24 AM 10/05/2022    8:23 PM 10/05/2022    5:53 PM  CBC  WBC 4.0 - 10.5 K/uL 6.5   8.7   Hemoglobin 13.0 - 17.0 g/dL 7.9  8.2  8.4   Hematocrit 39.0 - 52.0 % 22.4  24.0  24.5   Platelets 150 - 400 K/uL 93   118        Latest Ref Rng & Units 10/06/2022    3:24 AM 10/05/2022    8:23 PM 10/05/2022    5:53 PM  BMP  Glucose 70 - 99 mg/dL 146   131   BUN 8 - 23 mg/dL 15   12   Creatinine 0.61 - 1.24 mg/dL 1.06   1.01   Sodium 135 - 145 mmol/L 136  139  139   Potassium 3.5 - 5.1 mmol/L 4.3  4.3  4.3   Chloride 98 - 111 mmol/L  105   107   CO2 22 - 32 mmol/L 20   21   Calcium 8.9 - 10.3 mg/dL 7.9   7.9     ABG    Component Value Date/Time   PHART 7.392 10/05/2022 2023   PCO2ART 31.4 (L) 10/05/2022 2023   PO2ART 174 (H) 10/05/2022 2023   HCO3 19.1 (L) 10/05/2022 2023   TCO2 20 (L) 10/05/2022 2023   ACIDBASEDEF 5.0 (H) 10/05/2022 2023   O2SAT 100 10/05/2022 2023       Melodie Bouillon, MD 10/06/2022 4:45 PM

## 2022-10-06 NOTE — Progress Notes (Signed)
    Progressing well. Spoke to he and wife. They were appreciative of the team.   Candee Furbish, MD

## 2022-10-06 NOTE — Progress Notes (Signed)
1 Day Post-Op Procedure(s) (LRB): CORONARY ARTERY BYPASS GRAFTING (CABG) X THREE BYPASSES USING OPEN LEFT INTERNAL MAMMARY ARTERY AND ENDOSCOPIC RIGHT GREATER SAPHENOUS VEIN HARVEST; CLIPPING OF ATRIAL APPENDAGE WITH 40MM ATRICLIP (N/A) TRANSESOPHAGEAL ECHOCARDIOGRAM (N/A) CLIPPING OF ATRIAL APPENDAGE Subjective: No complaints  Objective: Vital signs in last 24 hours: Temp:  [96.3 F (35.7 C)-99.7 F (37.6 C)] 99.7 F (37.6 C) (03/19 0500) Pulse Rate:  [72-89] 77 (03/19 0500) Cardiac Rhythm: Atrial paced (03/18 2330) Resp:  [12-27] 18 (03/19 0500) BP: (97-127)/(45-60) 108/53 (03/19 0500) SpO2:  [100 %] 100 % (03/19 0500) Arterial Line BP: (58-186)/(32-74) 156/35 (03/19 0500) FiO2 (%):  [40 %-50 %] 40 % (03/18 1951)  Hemodynamic parameters for last 24 hours: PAP: (8-28)/(-1-15) 15/5 CVP:  [0 mmHg] 0 mmHg CO:  [2.2 L/min-4.4 L/min] 4.1 L/min CI:  [1.3 L/min/m2-2.6 L/min/m2] 2.4 L/min/m2  Intake/Output from previous day: 03/18 0701 - 03/19 0700 In: 4990.2 [I.V.:3091.4; Blood:310; IV Piggyback:1588.8] Out: 3289 [Urine:2175; Blood:564; Chest Tube:550] Intake/Output this shift: Total I/O In: 581.4 [I.V.:382.7; IV Piggyback:198.8] Out: 755 [Urine:625; Chest Tube:130]  General appearance: alert and cooperative Neurologic: intact Heart: regular rate and rhythm, S1, S2 normal, no murmur Lungs: clear to auscultation bilaterally Extremities: edema mild Wound: dressings dry  Lab Results: Recent Labs    10/05/22 1753 10/05/22 2023 10/06/22 0324  WBC 8.7  --  6.5  HGB 8.4* 8.2* 7.9*  HCT 24.5* 24.0* 22.4*  PLT 118*  --  93*   BMET:  Recent Labs    10/05/22 1753 10/05/22 2023 10/06/22 0324  NA 139 139 136  K 4.3 4.3 4.3  CL 107  --  105  CO2 21*  --  20*  GLUCOSE 131*  --  146*  BUN 12  --  15  CREATININE 1.01  --  1.06  CALCIUM 7.9*  --  7.9*    PT/INR:  Recent Labs    10/05/22 1246  LABPROT 16.1*  INR 1.3*   ABG    Component Value Date/Time   PHART  7.392 10/05/2022 2023   HCO3 19.1 (L) 10/05/2022 2023   TCO2 20 (L) 10/05/2022 2023   ACIDBASEDEF 5.0 (H) 10/05/2022 2023   O2SAT 100 10/05/2022 2023   CBG (last 3)  Recent Labs    10/05/22 2307 10/06/22 0100 10/06/22 0328  GLUCAP 121* 140* 112*   CXR clear  ECG: pending Assessment/Plan: S/P Procedure(s) (LRB): CORONARY ARTERY BYPASS GRAFTING (CABG) X THREE BYPASSES USING OPEN LEFT INTERNAL MAMMARY ARTERY AND ENDOSCOPIC RIGHT GREATER SAPHENOUS VEIN HARVEST; CLIPPING OF ATRIAL APPENDAGE WITH 40MM ATRICLIP (N/A) TRANSESOPHAGEAL ECHOCARDIOGRAM (N/A) CLIPPING OF ATRIAL APPENDAGE  POD 1 Hemodynamically stable in sinus rhythm. Continue Lopressor. DC pacing wires. DC swan and arterial line today.  Volume excess; start diuresis.  Anemia due to periop blood loss and dilution from postop fluid. Start diuresis. Start iron.  DC chest tubes today after pacing wires if stable.  IS, OOB, ambulate.  Glucose under good control on SSI. Preop Hgb A1c 5.3 on no meds.   LOS: 3 days    Gaye Pollack 10/06/2022

## 2022-10-07 ENCOUNTER — Encounter (HOSPITAL_COMMUNITY): Payer: Self-pay | Admitting: Surgery

## 2022-10-07 ENCOUNTER — Inpatient Hospital Stay (HOSPITAL_COMMUNITY): Payer: PPO

## 2022-10-07 LAB — CBC
HCT: 20.7 % — ABNORMAL LOW (ref 39.0–52.0)
HCT: 25.5 % — ABNORMAL LOW (ref 39.0–52.0)
Hemoglobin: 6.8 g/dL — CL (ref 13.0–17.0)
Hemoglobin: 8.7 g/dL — ABNORMAL LOW (ref 13.0–17.0)
MCH: 35.4 pg — ABNORMAL HIGH (ref 26.0–34.0)
MCH: 32.8 pg (ref 26.0–34.0)
MCHC: 32.9 g/dL (ref 30.0–36.0)
MCHC: 34.1 g/dL (ref 30.0–36.0)
MCV: 107.8 fL — ABNORMAL HIGH (ref 80.0–100.0)
MCV: 96.2 fL (ref 80.0–100.0)
Platelets: 84 10*3/uL — ABNORMAL LOW (ref 150–400)
Platelets: 111 10*3/uL — ABNORMAL LOW (ref 150–400)
RBC: 1.92 MIL/uL — ABNORMAL LOW (ref 4.22–5.81)
RBC: 2.65 MIL/uL — ABNORMAL LOW (ref 4.22–5.81)
RDW: 12.9 % (ref 11.5–15.5)
WBC: 5.1 10*3/uL (ref 4.0–10.5)
WBC: 6.8 10*3/uL (ref 4.0–10.5)
nRBC: 0 % (ref 0.0–0.2)
nRBC: 0 % (ref 0.0–0.2)

## 2022-10-07 LAB — BASIC METABOLIC PANEL
Anion gap: 7 (ref 5–15)
Anion gap: 7 (ref 5–15)
BUN: 24 mg/dL — ABNORMAL HIGH (ref 8–23)
BUN: 26 mg/dL — ABNORMAL HIGH (ref 8–23)
CO2: 25 mmol/L (ref 22–32)
CO2: 27 mmol/L (ref 22–32)
Calcium: 8 mg/dL — ABNORMAL LOW (ref 8.9–10.3)
Calcium: 8.1 mg/dL — ABNORMAL LOW (ref 8.9–10.3)
Chloride: 105 mmol/L (ref 98–111)
Chloride: 102 mmol/L (ref 98–111)
Creatinine, Ser: 1.23 mg/dL (ref 0.61–1.24)
Creatinine, Ser: 1.28 mg/dL — ABNORMAL HIGH (ref 0.61–1.24)
GFR, Estimated: >60 mL/min (ref >60)
GFR, Estimated: 58 mL/min — ABNORMAL LOW (ref >60)
Glucose, Bld: 127 mg/dL — ABNORMAL HIGH (ref 70–99)
Glucose, Bld: 141 mg/dL — ABNORMAL HIGH (ref 70–99)
Potassium: 4.1 mmol/L (ref 3.5–5.1)
Potassium: 3.7 mmol/L (ref 3.5–5.1)
Sodium: 137 mmol/L (ref 135–145)
Sodium: 136 mmol/L (ref 135–145)

## 2022-10-07 LAB — GLUCOSE, CAPILLARY
Glucose-Capillary: 117 mg/dL — ABNORMAL HIGH (ref 70–99)
Glucose-Capillary: 129 mg/dL — ABNORMAL HIGH (ref 70–99)
Glucose-Capillary: 132 mg/dL — ABNORMAL HIGH (ref 70–99)
Glucose-Capillary: 132 mg/dL — ABNORMAL HIGH (ref 70–99)
Glucose-Capillary: 133 mg/dL — ABNORMAL HIGH (ref 70–99)

## 2022-10-07 LAB — MAGNESIUM: Magnesium: 2.2 mg/dL (ref 1.7–2.4)

## 2022-10-07 LAB — PREPARE RBC (CROSSMATCH)

## 2022-10-07 MED ORDER — AMIODARONE LOAD VIA INFUSION
150.0000 mg | Freq: Once | INTRAVENOUS | Status: AC
  Administered 2022-10-07: 150 mg via INTRAVENOUS
  Filled 2022-10-07: qty 83.34

## 2022-10-07 MED ORDER — ~~LOC~~ CARDIAC SURGERY, PATIENT & FAMILY EDUCATION: Freq: Once | Status: AC

## 2022-10-07 MED ORDER — ASPIRIN 81 MG PO CHEW: 81.0000 mg | CHEWABLE_TABLET | Freq: Every day | ORAL | Status: DC

## 2022-10-07 MED ORDER — FUROSEMIDE 40 MG PO TABS
40.0000 mg | ORAL_TABLET | Freq: Every day | ORAL | Status: DC
  Administered 2022-10-08: 40 mg via ORAL
  Filled 2022-10-07: qty 1

## 2022-10-07 MED ORDER — SENNOSIDES-DOCUSATE SODIUM 8.6-50 MG PO TABS: 1.0000 | ORAL_TABLET | Freq: Two times a day (BID) | ORAL | Status: DC | PRN

## 2022-10-07 MED ORDER — CHLORHEXIDINE GLUCONATE CLOTH 2 % EX PADS
6.0000 | MEDICATED_PAD | Freq: Every day | CUTANEOUS | Status: DC
  Administered 2022-10-07 – 2022-10-09 (×3): 6 via TOPICAL

## 2022-10-07 MED ORDER — AMIODARONE HCL IN DEXTROSE 360-4.14 MG/200ML-% IV SOLN
60.0000 mg/h | INTRAVENOUS | Status: AC
  Administered 2022-10-07 (×2): 60 mg/h via INTRAVENOUS
  Filled 2022-10-07 (×2): qty 200

## 2022-10-07 MED ORDER — FUROSEMIDE 10 MG/ML IJ SOLN
40.0000 mg | Freq: Once | INTRAMUSCULAR | Status: AC
  Administered 2022-10-07: 40 mg via INTRAVENOUS
  Filled 2022-10-07: qty 4

## 2022-10-07 MED ORDER — SODIUM CHLORIDE 0.9 % IV SOLN: 250.0000 mL | INTRAVENOUS | Status: DC | PRN

## 2022-10-07 MED ORDER — SODIUM CHLORIDE 0.9% IV SOLUTION: Freq: Once | INTRAVENOUS | Status: AC

## 2022-10-07 MED ORDER — ONDANSETRON HCL 4 MG PO TABS: 4.0000 mg | ORAL_TABLET | Freq: Four times a day (QID) | ORAL | Status: DC | PRN

## 2022-10-07 MED ORDER — ASPIRIN 81 MG PO TBEC
81.0000 mg | DELAYED_RELEASE_TABLET | Freq: Every day | ORAL | Status: DC
  Administered 2022-10-08 – 2022-10-11 (×4): 81 mg via ORAL
  Filled 2022-10-07 (×4): qty 1

## 2022-10-07 MED ORDER — POTASSIUM CHLORIDE CRYS ER 20 MEQ PO TBCR
20.0000 meq | EXTENDED_RELEASE_TABLET | Freq: Once | ORAL | Status: AC
  Administered 2022-10-07: 20 meq via ORAL
  Filled 2022-10-07: qty 1

## 2022-10-07 MED ORDER — METOPROLOL TARTRATE 12.5 MG HALF TABLET: 12.5000 mg | ORAL_TABLET | Freq: Two times a day (BID) | ORAL | Status: DC

## 2022-10-07 MED ORDER — ONDANSETRON HCL 4 MG/2ML IJ SOLN: 4.0000 mg | Freq: Four times a day (QID) | INTRAMUSCULAR | Status: DC | PRN

## 2022-10-07 MED ORDER — POTASSIUM CHLORIDE CRYS ER 20 MEQ PO TBCR
20.0000 meq | EXTENDED_RELEASE_TABLET | Freq: Every day | ORAL | Status: DC
  Administered 2022-10-08: 20 meq via ORAL
  Filled 2022-10-07: qty 1

## 2022-10-07 MED ORDER — ASPIRIN 81 MG PO TBEC
81.0000 mg | DELAYED_RELEASE_TABLET | Freq: Every day | ORAL | Status: DC
  Administered 2022-10-07: 81 mg via ORAL
  Filled 2022-10-07: qty 1

## 2022-10-07 MED ORDER — SODIUM CHLORIDE 0.9% FLUSH: 3.0000 mL | INTRAVENOUS | Status: DC | PRN

## 2022-10-07 MED ORDER — AMIODARONE HCL IN DEXTROSE 360-4.14 MG/200ML-% IV SOLN
30.0000 mg/h | INTRAVENOUS | Status: DC
  Administered 2022-10-07 (×2): 30 mg/h via INTRAVENOUS
  Filled 2022-10-07: qty 200

## 2022-10-07 MED ORDER — SODIUM CHLORIDE 0.9% FLUSH
3.0000 mL | Freq: Two times a day (BID) | INTRAVENOUS | Status: DC
  Administered 2022-10-07: 3 mL via INTRAVENOUS

## 2022-10-07 NOTE — Progress Notes (Signed)
2 Days Post-Op Procedure(s) (LRB): CORONARY ARTERY BYPASS GRAFTING (CABG) X THREE BYPASSES USING OPEN LEFT INTERNAL MAMMARY ARTERY AND ENDOSCOPIC RIGHT GREATER SAPHENOUS VEIN HARVEST; CLIPPING OF ATRIAL APPENDAGE WITH 40MM ATRICLIP (N/A) TRANSESOPHAGEAL ECHOCARDIOGRAM (N/A) CLIPPING OF ATRIAL APPENDAGE Subjective: No complaints.  Objective: Vital signs in last 24 hours: Temp:  [98.1 F (36.7 C)-99.3 F (37.4 C)] 98.5 F (36.9 C) (03/20 0658) Pulse Rate:  [69-93] 72 (03/20 0658) Cardiac Rhythm: Normal sinus rhythm (03/20 0400) Resp:  [11-26] 20 (03/20 0658) BP: (81-126)/(31-60) 111/51 (03/20 0630) SpO2:  [90 %-100 %] 96 % (03/20 0658) Arterial Line BP: (169-179)/(41) 169/41 (03/19 0730)  Hemodynamic parameters for last 24 hours: PAP: (13)/(3) 13/3  Intake/Output from previous day: 03/19 0701 - 03/20 0700 In: 525.3 [I.V.:295.3; Blood:30; IV Piggyback:200] Out: [id number] Intake/Output this shift: No intake/output data recorded.  General appearance: alert and cooperative Neurologic: intact Heart: regular rate and rhythm, S1, S2 normal, no murmur Lungs: clear to auscultation bilaterally Extremities: edema mild Wound: dressing dry  Lab Results: Recent Labs    10/06/22 1651 10/07/22 0439  WBC 6.9 5.1  HGB 7.2* 6.8*  HCT 20.7* 20.7*  PLT 89* 84*   BMET:  Recent Labs    10/06/22 1651 10/07/22 0439  NA 135 137  K 3.8 4.1  CL 105 105  CO2 24 25  GLUCOSE 146* 127*  BUN 19 24*  CREATININE 1.10 1.23  CALCIUM 8.0* 8.0*    PT/INR:  Recent Labs    10/05/22 1246  LABPROT 16.1*  INR 1.3*   ABG    Component Value Date/Time   PHART 7.392 10/05/2022 2023   HCO3 19.1 (L) 10/05/2022 2023   TCO2 20 (L) 10/05/2022 2023   ACIDBASEDEF 5.0 (H) 10/05/2022 2023   O2SAT 100 10/05/2022 2023   CBG (last 3)  Recent Labs    10/06/22 1957 10/07/22 0005 10/07/22 0446  GLUCAP 108* 117* 129*    Assessment/Plan: S/P Procedure(s) (LRB): CORONARY ARTERY BYPASS  GRAFTING (CABG) X THREE BYPASSES USING OPEN LEFT INTERNAL MAMMARY ARTERY AND ENDOSCOPIC RIGHT GREATER SAPHENOUS VEIN HARVEST; CLIPPING OF ATRIAL APPENDAGE WITH 40MM ATRICLIP (N/A) TRANSESOPHAGEAL ECHOCARDIOGRAM (N/A) CLIPPING OF ATRIAL APPENDAGE  POD 2 Hemodynamically stable with low normal BP. Will continue low dose BB as tolerated.  Anemia due to periop blood loss and dilution in a small frame man. Preop Hgb 12.1. He is being transfused 1 unit this am. Will continue diuresis and repeat Hgb in am. Continue iron.  Volume excess: No wt yet. Continue diuresis.  Will transfer to 4E and continue IS, ambulation.   LOS: 4 days    Gaye Pollack 10/07/2022

## 2022-10-07 NOTE — Progress Notes (Signed)
Patient ID: Ruben [name], male   DOB: 11/15/1945, 77 y.o.   MRN: [id number]  TCTS Evening Rounds:  Hemodynamically stable with low normal BP. Back in sinus on IV amio.  Diuresed well today.   Hgb improved to 8.7 after transfusion.  BMET    Component Value Date/Time   NA 136 10/07/2022 1624   NA 145 06/11/2014 0101   K 3.7 10/07/2022 1624   K 3.7 06/11/2014 0101   CL 102 10/07/2022 1624   CL 113 (H) 06/11/2014 0101   CO2 27 10/07/2022 1624   CO2 26 06/11/2014 0101   GLUCOSE 141 (H) 10/07/2022 1624   GLUCOSE 101 (H) 06/11/2014 0101   BUN 26 (H) 10/07/2022 1624   BUN 16 06/11/2014 0101   CREATININE 1.28 (H) 10/07/2022 1624   CREATININE 1.09 06/11/2014 0101   CALCIUM 8.1 (L) 10/07/2022 1624   CALCIUM 7.4 (L) 06/11/2014 0101   GFRNONAA 58 (L) 10/07/2022 1624   GFRNONAA >60 06/11/2014 0101   GFRNONAA >60 11/03/2013 0501   CBC    Component Value Date/Time   WBC 6.8 10/07/2022 1624   RBC 2.65 (L) 10/07/2022 1624   HGB 8.7 (L) 10/07/2022 1624   HGB 12.5 (L) 08/14/2014 1229   HCT 25.5 (L) 10/07/2022 1624   HCT 39.2 (L) 08/14/2014 1229   PLT 111 (L) 10/07/2022 1624   PLT 200 08/14/2014 1229   MCV 96.2 10/07/2022 1624   MCV 94 08/14/2014 1229   MCH 32.8 10/07/2022 1624   MCHC 34.1 10/07/2022 1624   RDW Not Measured 10/07/2022 1624   RDW 14.4 08/14/2014 1229   LYMPHSABS 1.0 08/14/2014 1229   MONOABS 0.3 08/14/2014 1229   EOSABS 0.0 08/14/2014 1229   BASOSABS 0.0 08/14/2014 1229

## 2022-10-07 NOTE — Progress Notes (Signed)
Rounding Note    Patient Name: Ruben [name] Date of Encounter: 10/07/2022  [place] Cardiologist: None   Subjective   Resting, PRBC x 1, progressing  Inpatient Medications    Scheduled Meds:  acetaminophen  1,000 mg Oral Q6H   Or   acetaminophen (TYLENOL) oral liquid 160 mg/5 mL  1,000 mg Per Tube Q6H   aspirin EC  81 mg Oral Daily   Or   aspirin  81 mg Per Tube Daily   bisacodyl  10 mg Oral Daily   Or   bisacodyl  10 mg Rectal Daily   Chlorhexidine Gluconate Cloth  6 each Topical Daily   docusate sodium  200 mg Oral Daily   enoxaparin (LOVENOX) injection  40 mg Subcutaneous QHS   Fe Fum-Vit C-Vit B12-FA  1 capsule Oral QPC breakfast   furosemide  40 mg Intravenous Once   metoprolol tartrate  12.5 mg Oral BID   Or   metoprolol tartrate  12.5 mg Per Tube BID   pantoprazole  40 mg Oral Daily   potassium chloride  20 mEq Oral Once   rosuvastatin  40 mg Oral QHS   sodium chloride flush  3 mL Intravenous Q12H   Continuous Infusions:  sodium chloride Stopped (10/06/22 1518)   PRN Meds: metoprolol tartrate, ondansetron (ZOFRAN) IV, oxyCODONE, sodium chloride flush, traMADol   Vital Signs    Vitals:   10/07/22 0701 10/07/22 0715 10/07/22 0730 10/07/22 0840  BP:  (!) 113/58  (!) 113/54  Pulse:  71  72  Resp:  17  16  Temp:   98.5 F (36.9 C)   TempSrc:   Oral Oral  SpO2:  97%  93%  Weight: 60.2 kg     Height:        Intake/Output Summary (Last 24 hours) at 10/07/2022 0906 Last data filed at 10/07/2022 0840 Gross per 24 hour  Intake 713.06 ml  Output 1835 ml  Net -1121.94 ml      10/07/2022    7:01 AM 10/06/2022    6:50 AM 10/05/2022    4:58 AM  Last 3 Weights  Weight (lbs) 132 lb 11.5 oz 134 lb 14.7 oz 125 lb 10.6 oz  Weight (kg) 60.2 kg 61.2 kg 57 kg      Telemetry    No adverse rhythm, NSR - Personally Reviewed  ECG    NSR, subtle ST elevation inferior leads - Personally Reviewed  Physical Exam   GEN: Thin No acute  distress.   Neck: No JVD, IJ Cardiac: RRR, no murmurs, rubs, or gallops.  Respiratory: Clear to auscultation bilaterally. GI: Soft, nontender, non-distended  MS: No edema; No deformity. Neuro:  Nonfocal  Psych: Normal affect   Labs    High Sensitivity Troponin:   Recent Labs  Lab 10/02/22 0234 10/02/22 0429 10/02/22 1043 10/02/22 1215  TROPONINIHS 187* 1,346* 6,534* 6,270*     Chemistry Recent Labs  Lab 10/03/22 0545 10/05/22 0330 10/05/22 1753 10/05/22 2023 10/06/22 0324 10/06/22 1651 10/07/22 0439  NA 140   < > 139   < > 136 135 137  K 3.7   < > 4.3   < > 4.3 3.8 4.1  CL 108   < > 107  --  105 105 105  CO2 23   < > 21*  --  20* 24 25  GLUCOSE 82   < > 131*  --  146* 146* 127*  BUN 16   < > 12  --  15 19 24*  CREATININE 1.11   < > 1.01  --  1.06 1.10 1.23  CALCIUM 8.6*   < > 7.9*  --  7.9* 8.0* 8.0*  MG  --   --  2.9*  --  2.4 2.3  --   PROT 4.9*  --   --   --   --   --   --   ALBUMIN 3.1*  --   --   --   --   --   --   AST 36  --   --   --   --   --   --   ALT 18  --   --   --   --   --   --   ALKPHOS 12*  --   --   --   --   --   --   BILITOT 1.8*  --   --   --   --   --   --   GFRNONAA >60   < > >60  --  >60 >60 >60  ANIONGAP 9   < > 11  --  11 6 7    < > = values in this interval not displayed.    Lipids No results for input(s): "CHOL", "TRIG", "HDL", "LABVLDL", "LDLCALC", "CHOLHDL" in the last 168 hours.  Hematology Recent Labs  Lab 10/06/22 0324 10/06/22 1651 10/07/22 0439  WBC 6.5 6.9 5.1  RBC 2.16* 1.98* 1.92*  HGB 7.9* 7.2* 6.8*  HCT 22.4* 20.7* 20.7*  MCV 103.7* 104.5* 107.8*  MCH 36.6* 36.4* 35.4*  MCHC 35.3 34.8 32.9  RDW 12.3 12.8 12.9  PLT 93* 89* 84*   Thyroid No results for input(s): "TSH", "FREET4" in the last 168 hours.  BNPNo results for input(s): "BNP", "PROBNP" in the last 168 hours.  DDimer No results for input(s): "DDIMER" in the last 168 hours.   Radiology    DG Chest Port 1 View  Result Date: 10/07/2022 CLINICAL DATA:   History of CABG. EXAM: PORTABLE CHEST 1 VIEW COMPARISON:  10/06/2022 FINDINGS: Pulmonary artery catheter has been removed. Chest tubes have been removed. Right jugular central line is still present. Postsurgical changes compatible with CABG procedure and left atrial appendage clipping. Patchy densities at the medial left lung base are suggestive for atelectasis. Slightly increased densities in the retrocardiac space. Negative for a pneumothorax. IMPRESSION: 1. Removal of chest tubes and pulmonary artery catheter. Negative for pneumothorax. 2. Increased densities at the left lung base. Findings are suggestive for atelectasis. Electronically Signed   By: Markus Daft M.D.   On: 10/07/2022 08:18   DG Chest Port 1 View  Result Date: 10/06/2022 CLINICAL DATA:  Status post coronary bypass graft. EXAM: PORTABLE CHEST 1 VIEW COMPARISON:  October 05, 2022. FINDINGS: The heart size and mediastinal contours are within normal limits. Endotracheal and nasogastric tubes have been removed. Right internal jugular Swan-Ganz catheter is unchanged. Left-sided chest tube is noted with minimal left apical pneumothorax. Right lung is clear. The visualized skeletal structures are unremarkable. IMPRESSION: Left-sided chest tube with minimal left apical pneumothorax. Electronically Signed   By: Marijo Conception M.D.   On: 10/06/2022 08:17   DG Chest Port 1 View  Result Date: 10/05/2022 CLINICAL DATA:  Post CABG x3 EXAM: PORTABLE CHEST 1 VIEW COMPARISON:  Portable exam 1247 hours compared to 10/04/2022 FINDINGS: Tip of endotracheal tube projects 5.0 cm above carina. Nasogastric tube extends into stomach. RIGHT jugular Swan-Ganz catheter tip projects  over main pulmonary artery. Epicardial pacing wires present. Mediastinal drains and LEFT thoracostomy tube noted. Normal heart size post median sternotomy, CABG, and LEFT atrial appendage clipping. Mediastinal contours and pulmonary vascularity normal. Atherosclerotic calcification aorta.  Minimal LEFT basilar atelectasis. Trace LEFT apex pneumothorax. Calcified granuloma lower RIGHT chest. IMPRESSION: Postoperative changes of CABG as above. Trace LEFT apex pneumothorax despite thoracostomy tube. Electronically Signed   By: Lavonia Dana M.D.   On: 10/05/2022 13:15   EP STUDY  Result Date: 10/05/2022 See surgical note for result.   Cardiac Studies   Cardiac Studies & Procedures   CARDIAC CATHETERIZATION  CARDIAC CATHETERIZATION 10/02/2022  Narrative   Ost LM to Mid LM lesion is 75% stenosed.   Ost RCA to Prox RCA lesion is 90% stenosed.   Dist LM to Ost LAD lesion is 100% stenosed.   Ost Cx to Dist Cx lesion is 50% stenosed.   Prox RCA to Mid RCA lesion is 50% stenosed.   The left ventricular systolic function is normal.   LV end diastolic pressure is normal.   The left ventricular ejection fraction is 50-55% by visual estimate.   There is no mitral valve regurgitation.  Conclusion Status post non-STEMI presentation  Left heart cath right radial approach  Left ventriculogram showed preserved left ventricular function EF of at least 55%  Coronaries Left main large ostial 75 hazy calcified lesion LAD 100% occluded ostially TIMI I flow IRA Circumflex medium to small diffuse 50% RCA large 90% proximal 50% mid TIMI-3 flow Right dominant system Faint collaterals  Intervention deferred not indicated Recommend coronary bypass surgery at a tertiary care center  Findings Coronary Findings Diagnostic  Dominance: Right  Left Main Ost LM to Mid LM lesion is 75% stenosed. Vessel is not the culprit lesion. The lesion is type C, located proximal to the major branch, focal, discrete, eccentric, irregular and ulcerative. The lesion is moderately calcified. The lesion was not previously treated . The stenosis was measured by a visual reading. Pressure gradient was not performed on the lesion. IVUS was not performed. No optical coherence tomography (OCT) was performed. Dist LM  to Ost LAD lesion is 100% stenosed. Vessel is the culprit lesion. The lesion is type C, located at the major branch, focal, discrete and irregular. The lesion is moderately calcified. The lesion was not previously treated . The stenosis was measured by a visual reading. Pressure gradient was not performed on the lesion. IVUS was not performed. No optical coherence tomography (OCT) was performed.  Left Circumflex Ost Cx to Dist Cx lesion is 50% stenosed. Vessel is not the culprit lesion. The lesion is type C, located at the major branch and concentric. The lesion is mildly calcified. The lesion was not previously treated . The stenosis was measured by a visual reading. Pressure gradient was not performed on the lesion. IVUS was not performed. No optical coherence tomography (OCT) was performed.  Right Coronary Artery Ost RCA to Prox RCA lesion is 90% stenosed. Vessel is not the culprit lesion. The lesion is type A, located at the major branch, focal, discrete and eccentric. The lesion is moderately calcified. The lesion was not previously treated . The stenosis was measured by a visual reading. Pressure gradient was not performed on the lesion. IVUS was not performed. No optical coherence tomography (OCT) was performed. Prox RCA to Mid RCA lesion is 50% stenosed. Vessel is not the culprit lesion. The lesion is type B1 and located at the major branch. The lesion was  not previously treated . The stenosis was measured by a visual reading. Pressure gradient was not performed on the lesion. IVUS was not performed. No optical coherence tomography (OCT) was performed.  Intervention  No interventions have been documented.     ECHOCARDIOGRAM  ECHOCARDIOGRAM COMPLETE 10/02/2022  Narrative ECHOCARDIOGRAM REPORT    Patient Name:   Ruben [name] Date of Exam: 10/02/2022 Medical Rec #:  [id number]        Height:       68.0 in Accession #:    [id number]       Weight:       185.0 lb Date of Birth:   05-27-1946       BSA:          1.977 m Patient Age:    76 years         BP:           124/71 mmHg Patient Gender: M                HR:           56 bpm. Exam Location:  [facility]  Procedure: 2D Echo, Cardiac Doppler and Color Doppler  Indications:     NSTEMI  History:         Patient has prior history of Echocardiogram examinations, most recent 10/29/2020. Acute MI, TIA; Arrythmias:Tachycardia.  Sonographer:     Wenda Low Referring Phys:  [id number] [place] TANG Diagnosing Phys: Yolonda Kida MD   Sonographer Comments: Technically difficult study due to poor echo windows and suboptimal parasternal window. IMPRESSIONS   1. Left ventricular ejection fraction, by estimation, is 60 to 65%. The left ventricle has normal function. The left ventricle has no regional wall motion abnormalities. Left ventricular diastolic parameters are consistent with Grade I diastolic dysfunction (impaired relaxation). 2. Right ventricular systolic function is normal. The right ventricular size is normal. There is normal pulmonary artery systolic pressure. 3. The mitral valve is normal in structure. Trivial mitral valve regurgitation. 4. The aortic valve is normal in structure. Aortic valve regurgitation is not visualized.  FINDINGS Left Ventricle: Left ventricular ejection fraction, by estimation, is 60 to 65%. The left ventricle has normal function. The left ventricle has no regional wall motion abnormalities. The left ventricular internal cavity size was normal in size. There is no left ventricular hypertrophy. Left ventricular diastolic parameters are consistent with Grade I diastolic dysfunction (impaired relaxation).  Right Ventricle: The right ventricular size is normal. No increase in right ventricular wall thickness. Right ventricular systolic function is normal. There is normal pulmonary artery systolic pressure. The tricuspid regurgitant velocity is 1.59 m/s, and with an assumed right  atrial pressure of 3 mmHg, the estimated right ventricular systolic pressure is [id number].  Left Atrium: Left atrial size was normal in size.  Right Atrium: Right atrial size was normal in size.  Pericardium: There is no evidence of pericardial effusion.  Mitral Valve: The mitral valve is normal in structure. There is moderate thickening of the mitral valve leaflet(s). Normal mobility of the mitral valve leaflets. Trivial mitral valve regurgitation. MV peak gradient, 3.0 mmHg. The mean mitral valve gradient is 1.0 mmHg.  Tricuspid Valve: The tricuspid valve is normal in structure. Tricuspid valve regurgitation is trivial.  Aortic Valve: The aortic valve is normal in structure. Aortic valve regurgitation is not visualized. Aortic valve mean gradient measures 2.0 mmHg. Aortic valve peak gradient measures 3.2 mmHg. Aortic valve area, by VTI measures 2.51 cm.  Pulmonic Valve: The pulmonic valve was normal in structure. Pulmonic valve regurgitation is not visualized.  Aorta: The ascending aorta was not well visualized.  IAS/Shunts: No atrial level shunt detected by color flow Doppler.   LEFT VENTRICLE PLAX 2D LVIDd:         4.30 cm   Diastology LVIDs:         2.00 cm   LV e' medial:    7.18 cm/s LV PW:         1.00 cm   LV E/e' medial:  9.5 LV IVS:        0.80 cm   LV e' lateral:   8.70 cm/s LVOT diam:     1.90 cm   LV E/e' lateral: 7.8 LV SV:         50 LV SV Index:   25 LVOT Area:     2.84 cm   RIGHT VENTRICLE RV Basal diam:  2.85 cm RV Mid diam:    2.80 cm RV S prime:     15.60 cm/s TAPSE (M-mode): 2.2 cm  LEFT ATRIUM             Index        RIGHT ATRIUM           Index LA diam:        3.20 cm 1.62 cm/m   RA Area:     12.10 cm LA Vol (A2C):   64.3 ml 32.52 ml/m  RA Volume:   25.80 ml  13.05 ml/m LA Vol (A4C):   77.9 ml 39.40 ml/m LA Biplane Vol: 71.3 ml 36.06 ml/m AORTIC VALVE                    PULMONIC VALVE AV Area (Vmax):    2.57 cm     PV Vmax:       0.63  m/s AV Area (Vmean):   2.44 cm     PV Peak grad:  1.6 mmHg AV Area (VTI):     2.51 cm AV Vmax:           88.80 cm/s AV Vmean:          61.300 cm/s AV VTI:            0.199 m AV Peak Grad:      3.2 mmHg AV Mean Grad:      2.0 mmHg LVOT Vmax:         80.60 cm/s LVOT Vmean:        52.700 cm/s LVOT VTI:          0.176 m LVOT/AV VTI ratio: 0.88  AORTA Ao Root diam: 3.10 cm  MITRAL VALVE               TRICUSPID VALVE MV Area (PHT): 2.74 cm    TR Peak grad:   10.1 mmHg MV Area VTI:   1.68 cm    TR Vmax:        159.00 cm/s MV Peak grad:  3.0 mmHg MV Mean grad:  1.0 mmHg    SHUNTS MV Vmax:       0.87 m/s    Systemic VTI:  0.18 m MV Vmean:      38.9 cm/s   Systemic Diam: 1.90 cm MV Decel Time: 277 msec MV E velocity: 68.10 cm/s MV A velocity: 84.90 cm/s MV E/A ratio:  0.80  Dwayne D Callwood MD Electronically signed by Yolonda Kida MD Signature Date/Time: 10/02/2022/1:47:29 PM  Final   TEE  ECHO INTRAOPERATIVE TEE 10/05/2022  Narrative *INTRAOPERATIVE TRANSESOPHAGEAL REPORT *    Patient Name:   Ruben [name] Date of Exam: 10/05/2022 Medical Rec #:  [id number]        Height:       68.0 in Accession #:    [id number]       Weight:       125.7 lb Date of Birth:  August 27, 1945       BSA:          1.68 m Patient Age:    41 years         BP:           157/67 mmHg Patient Gender: M                HR:           89 bpm. Exam Location:  Anesthesiology  Transesophogeal exam was perform intraoperatively during surgical procedure. Patient was closely monitored under general anesthesia during the entirety of examination.  Indications:     I25.110 Atherosclerotic heart disease of native coronary artery with unstable angina pectoris Sonographer:     Roseanna Rainbow RDCS Performing Phys: [id number] Diagnosing Phys: Roderic Palau MD  Complications: No known complications during this procedure. POST-OP IMPRESSIONS Overall, there were no significant changes from  pre-bypass.  PRE-OP FINDINGS Left Ventricle: The left ventricle has normal systolic function, with an ejection fraction of 60-65%. The cavity size was normal. There is moderately increased left ventricular wall thickness. No evidence of left ventricular regional wall motion abnormalities. There is moderate concentric left ventricular hypertrophy.   Right Ventricle: The right ventricle has normal systolic function. The cavity was normal. There is no increase in right ventricular wall thickness.  Left Atrium: Left atrial size was not assessed. No left atrial/left atrial appendage thrombus was detected.  Right Atrium: Right atrial size was not assessed.  Interatrial Septum: No atrial level shunt detected by color flow Doppler.  Pericardium: There is no evidence of pericardial effusion.  Mitral Valve: The mitral valve is normal in structure. Mitral valve regurgitation is not visualized by color flow Doppler.  Tricuspid Valve: The tricuspid valve was normal in structure. Tricuspid valve regurgitation was not visualized by color flow Doppler.  Aortic Valve: The aortic valve is normal in structure. Aortic valve regurgitation was not visualized by color flow Doppler.   Pulmonic Valve: The pulmonic valve was normal in structure. Pulmonic valve regurgitation is mild by color flow Doppler.    Roderic Palau MD Electronically signed by Roderic Palau MD Signature Date/Time: 10/05/2022/12:55:36 PM    Final             Patient Profile     77 y.o. male post CABG 10/05/22, NSTEMI  Assessment & Plan    Anemia  - 1 unit PRBC  NSTEMI (65% EF)  - post CABG, ASA, STATIN, Bb low dose due to BP  - Lasix 40 IV  Encourage protein use (BMI 20) Spoke to daughter, nurse.     For questions or updates, please contact [place] Please consult [email or web address] for contact info under        Signed, Candee Furbish, MD  10/07/2022, 9:06 AM

## 2022-10-07 NOTE — Plan of Care (Signed)
  Problem: Education: Goal: Knowledge of General Education information will improve Description: Including pain rating scale, medication(s)/side effects and non-pharmacologic comfort measures Outcome: Progressing   Problem: Health Behavior/Discharge Planning: Goal: Ability to manage health-related needs will improve Outcome: Progressing   Problem: Clinical Measurements: Goal: Ability to maintain clinical measurements within normal limits will improve Outcome: Progressing Goal: Will remain free from infection Outcome: Progressing Goal: Diagnostic test results will improve Outcome: Progressing Goal: Respiratory complications will improve Outcome: Progressing Goal: Cardiovascular complication will be avoided Outcome: Progressing   Problem: Activity: Goal: Risk for activity intolerance will decrease Outcome: Progressing   Problem: Nutrition: Goal: Adequate nutrition will be maintained Outcome: Progressing   Problem: Coping: Goal: Level of anxiety will decrease Outcome: Progressing   Problem: Elimination: Goal: Will not experience complications related to bowel motility Outcome: Progressing Goal: Will not experience complications related to urinary retention Outcome: Progressing   Problem: Pain Managment: Goal: General experience of comfort will improve Outcome: Progressing   Problem: Safety: Goal: Ability to remain free from injury will improve Outcome: Progressing   Problem: Skin Integrity: Goal: Risk for impaired skin integrity will decrease Outcome: Progressing   Problem: Education: Goal: Understanding of cardiac disease, CV risk reduction, and recovery process will improve Outcome: Progressing Goal: Individualized Educational Video(s) Outcome: Progressing   Problem: Activity: Goal: Ability to tolerate increased activity will improve Outcome: Progressing   Problem: Cardiac: Goal: Ability to achieve and maintain adequate cardiovascular perfusion will  improve Outcome: Progressing   Problem: Health Behavior/Discharge Planning: Goal: Ability to safely manage health-related needs after discharge will improve Outcome: Progressing   Problem: Education: Goal: Understanding of CV disease, CV risk reduction, and recovery process will improve Outcome: Progressing Goal: Individualized Educational Video(s) Outcome: Progressing   Problem: Activity: Goal: Ability to return to baseline activity level will improve Outcome: Progressing   Problem: Cardiovascular: Goal: Ability to achieve and maintain adequate cardiovascular perfusion will improve Outcome: Progressing Goal: Vascular access site(s) Level 0-1 will be maintained Outcome: Progressing   Problem: Health Behavior/Discharge Planning: Goal: Ability to safely manage health-related needs after discharge will improve Outcome: Progressing   Problem: Education: Goal: Will demonstrate proper wound care and an understanding of methods to prevent future damage Outcome: Progressing Goal: Knowledge of disease or condition will improve Outcome: Progressing Goal: Knowledge of the prescribed therapeutic regimen will improve Outcome: Progressing Goal: Individualized Educational Video(s) Outcome: Progressing   Problem: Activity: Goal: Risk for activity intolerance will decrease Outcome: Progressing   Problem: Cardiac: Goal: Will achieve and/or maintain hemodynamic stability Outcome: Progressing   Problem: Clinical Measurements: Goal: Postoperative complications will be avoided or minimized Outcome: Progressing   Problem: Respiratory: Goal: Respiratory status will improve Outcome: Progressing   Problem: Skin Integrity: Goal: Wound healing without signs and symptoms of infection Outcome: Progressing Goal: Risk for impaired skin integrity will decrease Outcome: Progressing   Problem: Urinary Elimination: Goal: Ability to achieve and maintain adequate renal perfusion and functioning  will improve Outcome: Progressing   

## 2022-10-08 LAB — CBC
HCT: 21.7 % — ABNORMAL LOW (ref 39.0–52.0)
Hemoglobin: 7.4 g/dL — ABNORMAL LOW (ref 13.0–17.0)
MCH: 33 pg (ref 26.0–34.0)
MCHC: 34.1 g/dL (ref 30.0–36.0)
MCV: 96.9 fL (ref 80.0–100.0)
Platelets: 87 10*3/uL — ABNORMAL LOW (ref 150–400)
RBC: 2.24 MIL/uL — ABNORMAL LOW (ref 4.22–5.81)
WBC: 5.7 10*3/uL (ref 4.0–10.5)
nRBC: 0 % (ref 0.0–0.2)

## 2022-10-08 LAB — BASIC METABOLIC PANEL
Anion gap: 8 (ref 5–15)
BUN: 27 mg/dL — ABNORMAL HIGH (ref 8–23)
CO2: 25 mmol/L (ref 22–32)
Calcium: 7.9 mg/dL — ABNORMAL LOW (ref 8.9–10.3)
Chloride: 104 mmol/L (ref 98–111)
Creatinine, Ser: 1.29 mg/dL — ABNORMAL HIGH (ref 0.61–1.24)
GFR, Estimated: 58 mL/min — ABNORMAL LOW (ref >60)
Glucose, Bld: 88 mg/dL (ref 70–99)
Potassium: 3.6 mmol/L (ref 3.5–5.1)
Sodium: 137 mmol/L (ref 135–145)

## 2022-10-08 LAB — TYPE AND SCREEN
ABO/RH(D): O NEG
Antibody Screen: NEGATIVE
Unit division: 0

## 2022-10-08 LAB — BPAM RBC
Blood Product Expiration Date: 202404012359
ISSUE DATE / TIME: 202403200632
Unit Type and Rh: 9500

## 2022-10-08 MED ORDER — AMIODARONE HCL 200 MG PO TABS
400.0000 mg | ORAL_TABLET | Freq: Two times a day (BID) | ORAL | Status: DC
  Administered 2022-10-08 – 2022-10-11 (×7): 400 mg via ORAL
  Filled 2022-10-08 (×7): qty 2

## 2022-10-08 MED ORDER — DIPHENHYDRAMINE HCL 25 MG PO CAPS
25.0000 mg | ORAL_CAPSULE | Freq: Every evening | ORAL | Status: DC | PRN
  Administered 2022-10-08 – 2022-10-09 (×2): 25 mg via ORAL
  Filled 2022-10-08 (×2): qty 1

## 2022-10-08 NOTE — Progress Notes (Signed)
Rounding Note    Patient Name: Ruben [name] Date of Encounter: 10/08/2022  [place] Cardiologist: None   Subjective   Resting, PRBC x 1 yesterday. Walked previously. Progressing. Brief AFIB. On AMIO IV overnight, now PO.   Inpatient Medications    Scheduled Meds:  acetaminophen  1,000 mg Oral Q6H   Or   acetaminophen (TYLENOL) oral liquid 160 mg/5 mL  1,000 mg Per Tube Q6H   amiodarone  400 mg Oral BID   aspirin EC  81 mg Oral Daily   Chlorhexidine Gluconate Cloth  6 each Topical Daily   Fe Fum-Vit C-Vit B12-FA  1 capsule Oral QPC breakfast   furosemide  40 mg Oral Daily   pantoprazole  40 mg Oral Daily   potassium chloride  20 mEq Oral Daily   rosuvastatin  40 mg Oral QHS   Continuous Infusions:   PRN Meds: ondansetron **OR** ondansetron (ZOFRAN) IV, oxyCODONE, senna-docusate, traMADol   Vital Signs    Vitals:   10/08/22 0500 10/08/22 0600 10/08/22 0700 10/08/22 0747  BP: (!) 114/54 119/60 (!) 120/53   Pulse: 64 70 77   Resp: 19 19 (!) 22   Temp:    98.1 F (36.7 C)  TempSrc:    Oral  SpO2: 92% 94% 97%   Weight: 59.6 kg     Height:        Intake/Output Summary (Last 24 hours) at 10/08/2022 0955 Last data filed at 10/08/2022 0000 Gross per 24 hour  Intake 681.14 ml  Output 2330 ml  Net -1648.86 ml      10/08/2022    5:00 AM 10/07/2022    7:01 AM 10/06/2022    6:50 AM  Last 3 Weights  Weight (lbs) 131 lb 6.4 oz 132 lb 11.5 oz 134 lb 14.7 oz  Weight (kg) 59.603 kg 60.2 kg 61.2 kg      Telemetry    No adverse rhythm, NSR - Personally Reviewed  ECG    NSR, subtle ST elevation inferior leads - Personally Reviewed  Physical Exam   GEN: Thin No acute distress.   Neck: No JVD, IJ Cardiac: RRR, no murmurs, rubs, or gallops. CABG scar Respiratory: Clear to auscultation bilaterally. GI: Soft, nontender, non-distended  MS: No edema; No deformity. Neuro:  Nonfocal  Psych: Normal affect   Labs    High Sensitivity Troponin:    Recent Labs  Lab 10/02/22 0234 10/02/22 0429 10/02/22 1043 10/02/22 1215  TROPONINIHS 187* 1,346* 6,534* 6,270*     Chemistry Recent Labs  Lab 10/03/22 0545 10/05/22 0330 10/06/22 0324 10/06/22 1651 10/07/22 0439 10/07/22 1624 10/08/22 0346  NA 140   < > 136 135 137 136 137  K 3.7   < > 4.3 3.8 4.1 3.7 3.6  CL 108   < > 105 105 105 102 104  CO2 23   < > 20* 24 25 27 25   GLUCOSE 82   < > 146* 146* 127* 141* 88  BUN 16   < > 15 19 24* 26* 27*  CREATININE 1.11   < > 1.06 1.10 1.23 1.28* 1.29*  CALCIUM 8.6*   < > 7.9* 8.0* 8.0* 8.1* 7.9*  MG  --    < > 2.4 2.3  --  2.2  --   PROT 4.9*  --   --   --   --   --   --   ALBUMIN 3.1*  --   --   --   --   --   --  AST 36  --   --   --   --   --   --   ALT 18  --   --   --   --   --   --   ALKPHOS 12*  --   --   --   --   --   --   BILITOT 1.8*  --   --   --   --   --   --   GFRNONAA >60   < > >60 >60 >60 58* 58*  ANIONGAP 9   < > 11 6 7 7 8    < > = values in this interval not displayed.    Lipids No results for input(s): "CHOL", "TRIG", "HDL", "LABVLDL", "LDLCALC", "CHOLHDL" in the last 168 hours.  Hematology Recent Labs  Lab 10/07/22 0439 10/07/22 1624 10/08/22 0346  WBC 5.1 6.8 5.7  RBC 1.92* 2.65* 2.24*  HGB 6.8* 8.7* 7.4*  HCT 20.7* 25.5* 21.7*  MCV 107.8* 96.2 96.9  MCH 35.4* 32.8 33.0  MCHC 32.9 34.1 34.1  RDW 12.9 Not Measured Not Measured  PLT 84* 111* 87*   Thyroid No results for input(s): "TSH", "FREET4" in the last 168 hours.  BNPNo results for input(s): "BNP", "PROBNP" in the last 168 hours.  DDimer No results for input(s): "DDIMER" in the last 168 hours.   Radiology    DG Chest Port 1 View  Result Date: 10/07/2022 CLINICAL DATA:  History of CABG. EXAM: PORTABLE CHEST 1 VIEW COMPARISON:  10/06/2022 FINDINGS: Pulmonary artery catheter has been removed. Chest tubes have been removed. Right jugular central line is still present. Postsurgical changes compatible with CABG procedure and left atrial  appendage clipping. Patchy densities at the medial left lung base are suggestive for atelectasis. Slightly increased densities in the retrocardiac space. Negative for a pneumothorax. IMPRESSION: 1. Removal of chest tubes and pulmonary artery catheter. Negative for pneumothorax. 2. Increased densities at the left lung base. Findings are suggestive for atelectasis. Electronically Signed   By: Markus Daft M.D.   On: 10/07/2022 08:18    Cardiac Studies   Cardiac Studies & Procedures   CARDIAC CATHETERIZATION  CARDIAC CATHETERIZATION 10/02/2022  Narrative   Ost LM to Mid LM lesion is 75% stenosed.   Ost RCA to Prox RCA lesion is 90% stenosed.   Dist LM to Ost LAD lesion is 100% stenosed.   Ost Cx to Dist Cx lesion is 50% stenosed.   Prox RCA to Mid RCA lesion is 50% stenosed.   The left ventricular systolic function is normal.   LV end diastolic pressure is normal.   The left ventricular ejection fraction is 50-55% by visual estimate.   There is no mitral valve regurgitation.  Conclusion Status post non-STEMI presentation  Left heart cath right radial approach  Left ventriculogram showed preserved left ventricular function EF of at least 55%  Coronaries Left main large ostial 75 hazy calcified lesion LAD 100% occluded ostially TIMI I flow IRA Circumflex medium to small diffuse 50% RCA large 90% proximal 50% mid TIMI-3 flow Right dominant system Faint collaterals  Intervention deferred not indicated Recommend coronary bypass surgery at a tertiary care center  Findings Coronary Findings Diagnostic  Dominance: Right  Left Main Ost LM to Mid LM lesion is 75% stenosed. Vessel is not the culprit lesion. The lesion is type C, located proximal to the major branch, focal, discrete, eccentric, irregular and ulcerative. The lesion is moderately calcified. The lesion was not previously  treated . The stenosis was measured by a visual reading. Pressure gradient was not performed on the lesion.  IVUS was not performed. No optical coherence tomography (OCT) was performed. Dist LM to Ost LAD lesion is 100% stenosed. Vessel is the culprit lesion. The lesion is type C, located at the major branch, focal, discrete and irregular. The lesion is moderately calcified. The lesion was not previously treated . The stenosis was measured by a visual reading. Pressure gradient was not performed on the lesion. IVUS was not performed. No optical coherence tomography (OCT) was performed.  Left Circumflex Ost Cx to Dist Cx lesion is 50% stenosed. Vessel is not the culprit lesion. The lesion is type C, located at the major branch and concentric. The lesion is mildly calcified. The lesion was not previously treated . The stenosis was measured by a visual reading. Pressure gradient was not performed on the lesion. IVUS was not performed. No optical coherence tomography (OCT) was performed.  Right Coronary Artery Ost RCA to Prox RCA lesion is 90% stenosed. Vessel is not the culprit lesion. The lesion is type A, located at the major branch, focal, discrete and eccentric. The lesion is moderately calcified. The lesion was not previously treated . The stenosis was measured by a visual reading. Pressure gradient was not performed on the lesion. IVUS was not performed. No optical coherence tomography (OCT) was performed. Prox RCA to Mid RCA lesion is 50% stenosed. Vessel is not the culprit lesion. The lesion is type B1 and located at the major branch. The lesion was not previously treated . The stenosis was measured by a visual reading. Pressure gradient was not performed on the lesion. IVUS was not performed. No optical coherence tomography (OCT) was performed.  Intervention  No interventions have been documented.     ECHOCARDIOGRAM  ECHOCARDIOGRAM COMPLETE 10/02/2022  Narrative ECHOCARDIOGRAM REPORT    Patient Name:   RAYGAN CHINCHILLA Date of Exam: 10/02/2022 Medical Rec #:  [id number]        Height:       68.0  in Accession #:    [id number]       Weight:       185.0 lb Date of Birth:  Jul 18, 1946       BSA:          1.977 m Patient Age:    77 years         BP:           124/71 mmHg Patient Gender: M                HR:           56 bpm. Exam Location:  [facility]  Procedure: 2D Echo, Cardiac Doppler and Color Doppler  Indications:     NSTEMI  History:         Patient has prior history of Echocardiogram examinations, most recent 10/29/2020. Acute MI, TIA; Arrythmias:Tachycardia.  Sonographer:     Wenda Low Referring Phys:  [id number] [place] TANG Diagnosing Phys: Yolonda Kida MD   Sonographer Comments: Technically difficult study due to poor echo windows and suboptimal parasternal window. IMPRESSIONS   1. Left ventricular ejection fraction, by estimation, is 60 to 65%. The left ventricle has normal function. The left ventricle has no regional wall motion abnormalities. Left ventricular diastolic parameters are consistent with Grade I diastolic dysfunction (impaired relaxation). 2. Right ventricular systolic function is normal. The right ventricular size is normal. There is normal pulmonary artery systolic  pressure. 3. The mitral valve is normal in structure. Trivial mitral valve regurgitation. 4. The aortic valve is normal in structure. Aortic valve regurgitation is not visualized.  FINDINGS Left Ventricle: Left ventricular ejection fraction, by estimation, is 60 to 65%. The left ventricle has normal function. The left ventricle has no regional wall motion abnormalities. The left ventricular internal cavity size was normal in size. There is no left ventricular hypertrophy. Left ventricular diastolic parameters are consistent with Grade I diastolic dysfunction (impaired relaxation).  Right Ventricle: The right ventricular size is normal. No increase in right ventricular wall thickness. Right ventricular systolic function is normal. There is normal pulmonary artery systolic pressure.  The tricuspid regurgitant velocity is 1.59 m/s, and with an assumed right atrial pressure of 3 mmHg, the estimated right ventricular systolic pressure is [id number].  Left Atrium: Left atrial size was normal in size.  Right Atrium: Right atrial size was normal in size.  Pericardium: There is no evidence of pericardial effusion.  Mitral Valve: The mitral valve is normal in structure. There is moderate thickening of the mitral valve leaflet(s). Normal mobility of the mitral valve leaflets. Trivial mitral valve regurgitation. MV peak gradient, 3.0 mmHg. The mean mitral valve gradient is 1.0 mmHg.  Tricuspid Valve: The tricuspid valve is normal in structure. Tricuspid valve regurgitation is trivial.  Aortic Valve: The aortic valve is normal in structure. Aortic valve regurgitation is not visualized. Aortic valve mean gradient measures 2.0 mmHg. Aortic valve peak gradient measures 3.2 mmHg. Aortic valve area, by VTI measures 2.51 cm.  Pulmonic Valve: The pulmonic valve was normal in structure. Pulmonic valve regurgitation is not visualized.  Aorta: The ascending aorta was not well visualized.  IAS/Shunts: No atrial level shunt detected by color flow Doppler.   LEFT VENTRICLE PLAX 2D LVIDd:         4.30 cm   Diastology LVIDs:         2.00 cm   LV e' medial:    7.18 cm/s LV PW:         1.00 cm   LV E/e' medial:  9.5 LV IVS:        0.80 cm   LV e' lateral:   8.70 cm/s LVOT diam:     1.90 cm   LV E/e' lateral: 7.8 LV SV:         50 LV SV Index:   25 LVOT Area:     2.84 cm   RIGHT VENTRICLE RV Basal diam:  2.85 cm RV Mid diam:    2.80 cm RV S prime:     15.60 cm/s TAPSE (M-mode): 2.2 cm  LEFT ATRIUM             Index        RIGHT ATRIUM           Index LA diam:        3.20 cm 1.62 cm/m   RA Area:     12.10 cm LA Vol (A2C):   64.3 ml 32.52 ml/m  RA Volume:   25.80 ml  13.05 ml/m LA Vol (A4C):   77.9 ml 39.40 ml/m LA Biplane Vol: 71.3 ml 36.06 ml/m AORTIC VALVE                     PULMONIC VALVE AV Area (Vmax):    2.57 cm     PV Vmax:       0.63 m/s AV Area (Vmean):   2.44 cm  PV Peak grad:  1.6 mmHg AV Area (VTI):     2.51 cm AV Vmax:           88.80 cm/s AV Vmean:          61.300 cm/s AV VTI:            0.199 m AV Peak Grad:      3.2 mmHg AV Mean Grad:      2.0 mmHg LVOT Vmax:         80.60 cm/s LVOT Vmean:        52.700 cm/s LVOT VTI:          0.176 m LVOT/AV VTI ratio: 0.88  AORTA Ao Root diam: 3.10 cm  MITRAL VALVE               TRICUSPID VALVE MV Area (PHT): 2.74 cm    TR Peak grad:   10.1 mmHg MV Area VTI:   1.68 cm    TR Vmax:        159.00 cm/s MV Peak grad:  3.0 mmHg MV Mean grad:  1.0 mmHg    SHUNTS MV Vmax:       0.87 m/s    Systemic VTI:  0.18 m MV Vmean:      38.9 cm/s   Systemic Diam: 1.90 cm MV Decel Time: 277 msec MV E velocity: 68.10 cm/s MV A velocity: 84.90 cm/s MV E/A ratio:  0.80  Yolonda Kida MD Electronically signed by Yolonda Kida MD Signature Date/Time: 10/02/2022/1:47:29 PM    Final   TEE  ECHO INTRAOPERATIVE TEE 10/05/2022  Narrative *INTRAOPERATIVE TRANSESOPHAGEAL REPORT *    Patient Name:   DARRIE SMUTNY Date of Exam: 10/05/2022 Medical Rec #:  [id number]        Height:       68.0 in Accession #:    [id number]       Weight:       125.7 lb Date of Birth:  1946-02-11       BSA:          1.68 m Patient Age:    69 years         BP:           157/67 mmHg Patient Gender: M                HR:           89 bpm. Exam Location:  Anesthesiology  Transesophogeal exam was perform intraoperatively during surgical procedure. Patient was closely monitored under general anesthesia during the entirety of examination.  Indications:     I25.110 Atherosclerotic heart disease of native coronary artery with unstable angina pectoris Sonographer:     Roseanna Rainbow RDCS Performing Phys: [id number] Pollack Diagnosing Phys: Roderic Palau MD  Complications: No known complications during this  procedure. POST-OP IMPRESSIONS Overall, there were no significant changes from pre-bypass.  PRE-OP FINDINGS Left Ventricle: The left ventricle has normal systolic function, with an ejection fraction of 60-65%. The cavity size was normal. There is moderately increased left ventricular wall thickness. No evidence of left ventricular regional wall motion abnormalities. There is moderate concentric left ventricular hypertrophy.   Right Ventricle: The right ventricle has normal systolic function. The cavity was normal. There is no increase in right ventricular wall thickness.  Left Atrium: Left atrial size was not assessed. No left atrial/left atrial appendage thrombus was detected.  Right Atrium: Right atrial size was not assessed.  Interatrial  Septum: No atrial level shunt detected by color flow Doppler.  Pericardium: There is no evidence of pericardial effusion.  Mitral Valve: The mitral valve is normal in structure. Mitral valve regurgitation is not visualized by color flow Doppler.  Tricuspid Valve: The tricuspid valve was normal in structure. Tricuspid valve regurgitation was not visualized by color flow Doppler.  Aortic Valve: The aortic valve is normal in structure. Aortic valve regurgitation was not visualized by color flow Doppler.   Pulmonic Valve: The pulmonic valve was normal in structure. Pulmonic valve regurgitation is mild by color flow Doppler.    Roderic Palau MD Electronically signed by Roderic Palau MD Signature Date/Time: 10/05/2022/12:55:36 PM    Final             Patient Profile     77 y.o. male post CABG 10/05/22, NSTEMI, post op AFIB, post op anemia.   Assessment & Plan    Anemia  - 1 unit PRBC 10/07/22  PAF  - IV amio, now PO. NSR. Post op AFIB.   NSTEMI (65% EF)  - post CABG, ASA, STATIN, Bb low dose due to BP  - Lasix 40 IV - 2.3 L out yesterday  Encourage protein use (BMI 20) Spoke to daughter, nurse.     For questions or  updates, please contact [facility] Please consult [email or web address] for contact info under        Signed, Candee Furbish, MD  10/08/2022, 9:55 AM

## 2022-10-08 NOTE — Progress Notes (Signed)
Pt arrived to unit from 2 heart VSS, A/O x 4,  CCMD called ,CHG given, pt oriented to unit,Will continue to monitor.   Albin Felling Jasiel Apachito, RN    10/08/22 1119  Vitals  Temp 98.1 F (36.7 C)  Temp Source Oral  BP 121/68  MAP (mmHg) 83  BP Location Left Arm  BP Method Automatic  Patient Position (if appropriate) Sitting  Pulse Rate 65  Pulse Rate Source Monitor  ECG Heart Rate 67  Resp 16  Level of Consciousness  Level of Consciousness Alert  Oxygen Therapy  SpO2 100 %  O2 Device Room Air  Pain Assessment  Pain Scale 0-10  Pain Score 0  MEWS Score  MEWS Temp 0  MEWS Systolic 0  MEWS Pulse 0  MEWS RR 0  MEWS LOC 0  MEWS Score 0  MEWS Score Color Green

## 2022-10-08 NOTE — Progress Notes (Signed)
CARDIAC REHAB PHASE I    Pt resting in bed, feeling better today compared to yesterday. Ambulated once today and sat in chair most of the morning. Just returned to bed for line removal per pt. Encouraged continued ambulation, IS use and PO intake. Will continue to follow.   [id number]  Vanessa Barbara, RN BSN 10/08/2022 10:48 AM

## 2022-10-08 NOTE — Progress Notes (Signed)
3 Days Post-Op Procedure(s) (LRB): CORONARY ARTERY BYPASS GRAFTING (CABG) X THREE BYPASSES USING OPEN LEFT INTERNAL MAMMARY ARTERY AND ENDOSCOPIC RIGHT GREATER SAPHENOUS VEIN HARVEST; CLIPPING OF ATRIAL APPENDAGE WITH 40MM ATRICLIP (N/A) TRANSESOPHAGEAL ECHOCARDIOGRAM (N/A) CLIPPING OF ATRIAL APPENDAGE Subjective: No complaints. Slept well and feels better. Walked 1.5 laps around the ICU this am. Sitting up eating breakfast.  Objective: Vital signs in last 24 hours: Temp:  [97.6 F (36.4 C)-98.5 F (36.9 C)] 98 F (36.7 C) (03/21 0400) Pulse Rate:  [57-115] 64 (03/21 0500) Cardiac Rhythm: Normal sinus rhythm (03/21 0400) Resp:  [15-27] 19 (03/21 0500) BP: (83-122)/(41-74) 114/54 (03/21 0500) SpO2:  [90 %-98 %] 92 % (03/21 0500) Weight:  [60.2 kg] 60.2 kg (03/20 0701)  Hemodynamic parameters for last 24 hours:    Intake/Output from previous day: 03/20 0701 - 03/21 0700 In: 1227.1 [P.O.:240; I.V.:447.1; Blood:340; IV Piggyback:200] Out: 2330 [Urine:2330] Intake/Output this shift: Total I/O In: 85.9 [I.V.:85.9] Out: 580 [Urine:580]  General appearance: alert and cooperative Neurologic: intact Heart: regular rate and rhythm, S1, S2 normal, no murmur Lungs: clear to auscultation bilaterally Extremities: no edema Wound: incision ok  Lab Results: Recent Labs    10/07/22 1624 10/08/22 0346  WBC 6.8 5.7  HGB 8.7* 7.4*  HCT 25.5* 21.7*  PLT 111* 87*   BMET:  Recent Labs    10/07/22 1624 10/08/22 0346  NA 136 137  K 3.7 3.6  CL 102 104  CO2 27 25  GLUCOSE 141* 88  BUN 26* 27*  CREATININE 1.28* 1.29*  CALCIUM 8.1* 7.9*    PT/INR:  Recent Labs    10/05/22 1246  LABPROT 16.1*  INR 1.3*   ABG    Component Value Date/Time   PHART 7.392 10/05/2022 2023   HCO3 19.1 (L) 10/05/2022 2023   TCO2 20 (L) 10/05/2022 2023   ACIDBASEDEF 5.0 (H) 10/05/2022 2023   O2SAT 100 10/05/2022 2023   CBG (last 3)  Recent Labs    10/07/22 0754 10/07/22 1128 10/07/22 1550   GLUCAP 133* 132* 132*    Assessment/Plan: S/P Procedure(s) (LRB): CORONARY ARTERY BYPASS GRAFTING (CABG) X THREE BYPASSES USING OPEN LEFT INTERNAL MAMMARY ARTERY AND ENDOSCOPIC RIGHT GREATER SAPHENOUS VEIN HARVEST; CLIPPING OF ATRIAL APPENDAGE WITH 40MM ATRICLIP (N/A) TRANSESOPHAGEAL ECHOCARDIOGRAM (N/A) CLIPPING OF ATRIAL APPENDAGE  POD 3  Hemodynamically stable in sinus rhythm on IV amio. Brief AF last night. Will switch to po today and treat any further AF with bolus of amio. Hold off on Lopressor today until we are sure BP is stable. Can resume tomorrow if BP ok.  Wt down 1.3 lbs. Still about 6 lbs over preop. Continue oral lasix.  Anemia: Hgb improved last night to 8.7 after transfusion but down slightly to 7.4 this am. He is asymptomatic so will hold off on any further transfusion for now and repeat in am. Continue iron.   Thrombocytopenia: stop Lovenox.  DC sleeve and transfer to 4E. Continue IS, ambulation.   LOS: 5 days    Gaye Pollack 10/08/2022

## 2022-10-08 NOTE — Progress Notes (Signed)
   10/08/22 2035  Mobility  Activity Ambulated with assistance in hallway  Range of Motion/Exercises Active;All extremities  Level of Assistance Standby assist, set-up cues, supervision of patient - no hands on  Assistive Device Front wheel walker  Distance Ambulated (ft) 700 ft  Activity Response Tolerated well  Hygiene  Hygiene Bath  Level of Assistance Independent  Skin Care Foam skin cleanser;Lotion/Skin conditioner (comment)  Linen Change Gown changed;Full linen changed   3rd walk of the day. Tolerated well. Assisted w/ bath and oral care before returning to bed.

## 2022-10-09 LAB — BASIC METABOLIC PANEL
Anion gap: 11 (ref 5–15)
BUN: 21 mg/dL (ref 8–23)
CO2: 23 mmol/L (ref 22–32)
Calcium: 8.1 mg/dL — ABNORMAL LOW (ref 8.9–10.3)
Chloride: 105 mmol/L (ref 98–111)
Creatinine, Ser: 1.39 mg/dL — ABNORMAL HIGH (ref 0.61–1.24)
GFR, Estimated: 53 mL/min — ABNORMAL LOW (ref >60)
Glucose, Bld: 96 mg/dL (ref 70–99)
Potassium: 3.4 mmol/L — ABNORMAL LOW (ref 3.5–5.1)
Sodium: 139 mmol/L (ref 135–145)

## 2022-10-09 LAB — PATHOLOGIST SMEAR REVIEW

## 2022-10-09 LAB — CBC
HCT: 24.7 % — ABNORMAL LOW (ref 39.0–52.0)
Hemoglobin: 8 g/dL — ABNORMAL LOW (ref 13.0–17.0)
MCH: 31.9 pg (ref 26.0–34.0)
MCHC: 32.4 g/dL (ref 30.0–36.0)
MCV: 98.4 fL (ref 80.0–100.0)
Platelets: 125 10*3/uL — ABNORMAL LOW (ref 150–400)
RBC: 2.51 MIL/uL — ABNORMAL LOW (ref 4.22–5.81)
RDW: 21.3 % — ABNORMAL HIGH (ref 11.5–15.5)
WBC: 5 10*3/uL (ref 4.0–10.5)
nRBC: 0 % (ref 0.0–0.2)

## 2022-10-09 MED ORDER — METOPROLOL TARTRATE 12.5 MG HALF TABLET
12.5000 mg | ORAL_TABLET | Freq: Two times a day (BID) | ORAL | Status: DC
  Administered 2022-10-09 – 2022-10-10 (×3): 12.5 mg via ORAL
  Filled 2022-10-09 (×3): qty 1

## 2022-10-09 MED ORDER — POTASSIUM CHLORIDE CRYS ER 20 MEQ PO TBCR
20.0000 meq | EXTENDED_RELEASE_TABLET | Freq: Three times a day (TID) | ORAL | Status: AC
  Administered 2022-10-09 (×3): 20 meq via ORAL
  Filled 2022-10-09: qty 1
  Filled 2022-10-09: qty 2
  Filled 2022-10-09: qty 1

## 2022-10-09 MED ORDER — POLYETHYLENE GLYCOL 3350 17 G PO PACK
17.0000 g | PACK | Freq: Once | ORAL | Status: AC
  Administered 2022-10-09: 17 g via ORAL
  Filled 2022-10-09: qty 1

## 2022-10-09 NOTE — Progress Notes (Signed)
CARDIAC REHAB PHASE I   PRE:  Rate/Rhythm: 63 SR  BP:  Sitting: 136/60      SaO2: 96 RA  MODE:  Ambulation: 470 ft   POST:  Rate/Rhythm: 76 SR  BP:  Sitting: 142/.53      SaO2: 98 RA  Pt ambulated in hall with minimal assist, using front wheel walker. Pt moving at slow steady pace, tolerated well with no pain, SOB or dizziness. Returned to chair with call bell and bedside table in reach. Post OHS education including site care, restrictions, risk factors, sternal precautions, IS use at home, needs for home at discharge and CRP2 reviewed with pt and wife. All questions and concerns addressed. Will refer to [facility] for CRP2. Will continue to follow.   [id number]  Vanessa Barbara, RN BSN 10/09/2022 10:13 AM

## 2022-10-09 NOTE — Plan of Care (Signed)

## 2022-10-09 NOTE — Progress Notes (Addendum)
Rounding Note    Patient Name: Ruben [name] Date of Encounter: 10/09/2022  Cardiologist: Dr. Saralyn Pilar   Subjective   Progressing well ambulating well, no further atrial fibrillation. Wife at bedside. Very appreciative.   Inpatient Medications    Scheduled Meds:  acetaminophen  1,000 mg Oral Q6H   Or   acetaminophen (TYLENOL) oral liquid 160 mg/5 mL  1,000 mg Per Tube Q6H   amiodarone  400 mg Oral BID   aspirin EC  81 mg Oral Daily   Chlorhexidine Gluconate Cloth  6 each Topical Daily   Fe Fum-Vit C-Vit B12-FA  1 capsule Oral QPC breakfast   pantoprazole  40 mg Oral Daily   polyethylene glycol  17 g Oral Once   potassium chloride  20 mEq Oral TID   rosuvastatin  40 mg Oral QHS   Continuous Infusions:   PRN Meds: diphenhydrAMINE, ondansetron **OR** ondansetron (ZOFRAN) IV, oxyCODONE, senna-docusate, traMADol   Vital Signs    Vitals:   10/08/22 2025 10/09/22 0005 10/09/22 0620 10/09/22 0830  BP: (!) 125/53 (!) 119/44 (!) 122/56 136/60  Pulse: 77 83  72  Resp: 20 18 17 19   Temp: 98.1 F (36.7 C) 98.5 F (36.9 C) 98.4 F (36.9 C) 98.3 F (36.8 C)  TempSrc: Oral Oral Oral Oral  SpO2: 100% 96% 98% 99%  Weight:   58.5 kg   Height:        Intake/Output Summary (Last 24 hours) at 10/09/2022 0957 Last data filed at 10/08/2022 2343 Gross per 24 hour  Intake --  Output 400 ml  Net -400 ml      10/09/2022    6:20 AM 10/08/2022    5:00 AM 10/07/2022    7:01 AM  Last 3 Weights  Weight (lbs) 128 lb 15.5 oz 131 lb 6.4 oz 132 lb 11.5 oz  Weight (kg) 58.5 kg 59.603 kg 60.2 kg      Telemetry    No adverse rhythm, NSR, unchanged- Personally Reviewed  ECG    NSR, subtle ST elevation inferior leads - Personally Reviewed  Physical Exam   GEN: Thin No acute distress.   Neck: No JVD, IJ removed Cardiac: RRR, no murmurs, rubs, or gallops. CABG scar Respiratory: Clear to auscultation bilaterally. GI: Soft, nontender, non-distended  MS: No edema; No  deformity. Neuro:  Nonfocal  Psych: Normal affect     Labs    High Sensitivity Troponin:   Recent Labs  Lab 10/02/22 0234 10/02/22 0429 10/02/22 1043 10/02/22 1215  TROPONINIHS 187* 1,346* 6,534* 6,270*     Chemistry Recent Labs  Lab 10/03/22 0545 10/05/22 0330 10/06/22 0324 10/06/22 1651 10/07/22 0439 10/07/22 1624 10/08/22 0346 10/09/22 0615  NA 140   < > 136 135   < > 136 137 139  K 3.7   < > 4.3 3.8   < > 3.7 3.6 3.4*  CL 108   < > 105 105   < > 102 104 105  CO2 23   < > 20* 24   < > 27 25 23   GLUCOSE 82   < > 146* 146*   < > 141* 88 96  BUN 16   < > 15 19   < > 26* 27* 21  CREATININE 1.11   < > 1.06 1.10   < > 1.28* 1.29* 1.39*  CALCIUM 8.6*   < > 7.9* 8.0*   < > 8.1* 7.9* 8.1*  MG  --    < > 2.4 2.3  --  2.2  --   --   PROT 4.9*  --   --   --   --   --   --   --   ALBUMIN 3.1*  --   --   --   --   --   --   --   AST 36  --   --   --   --   --   --   --   ALT 18  --   --   --   --   --   --   --   ALKPHOS 12*  --   --   --   --   --   --   --   BILITOT 1.8*  --   --   --   --   --   --   --   GFRNONAA >60   < > >60 >60   < > 58* 58* 53*  ANIONGAP 9   < > 11 6   < > 7 8 11    < > = values in this interval not displayed.    Lipids No results for input(s): "CHOL", "TRIG", "HDL", "LABVLDL", "LDLCALC", "CHOLHDL" in the last 168 hours.  Hematology Recent Labs  Lab 10/07/22 1624 10/08/22 0346 10/09/22 0615  WBC 6.8 5.7 5.0  RBC 2.65* 2.24* 2.51*  HGB 8.7* 7.4* 8.0*  HCT 25.5* 21.7* 24.7*  MCV 96.2 96.9 98.4  MCH 32.8 33.0 31.9  MCHC 34.1 34.1 32.4  RDW Not Measured Not Measured 21.3*  PLT 111* 87* 125*   Thyroid No results for input(s): "TSH", "FREET4" in the last 168 hours.  BNPNo results for input(s): "BNP", "PROBNP" in the last 168 hours.  DDimer No results for input(s): "DDIMER" in the last 168 hours.   Radiology    No results found.  Cardiac Studies   Cardiac Studies & Procedures   CARDIAC CATHETERIZATION  CARDIAC CATHETERIZATION  10/02/2022  Narrative   Ost LM to Mid LM lesion is 75% stenosed.   Ost RCA to Prox RCA lesion is 90% stenosed.   Dist LM to Ost LAD lesion is 100% stenosed.   Ost Cx to Dist Cx lesion is 50% stenosed.   Prox RCA to Mid RCA lesion is 50% stenosed.   The left ventricular systolic function is normal.   LV end diastolic pressure is normal.   The left ventricular ejection fraction is 50-55% by visual estimate.   There is no mitral valve regurgitation.  Conclusion Status post non-STEMI presentation  Left heart cath right radial approach  Left ventriculogram showed preserved left ventricular function EF of at least 55%  Coronaries Left main large ostial 75 hazy calcified lesion LAD 100% occluded ostially TIMI I flow IRA Circumflex medium to small diffuse 50% RCA large 90% proximal 50% mid TIMI-3 flow Right dominant system Faint collaterals  Intervention deferred not indicated Recommend coronary bypass surgery at a tertiary care center  Findings Coronary Findings Diagnostic  Dominance: Right  Left Main Ost LM to Mid LM lesion is 75% stenosed. Vessel is not the culprit lesion. The lesion is type C, located proximal to the major branch, focal, discrete, eccentric, irregular and ulcerative. The lesion is moderately calcified. The lesion was not previously treated . The stenosis was measured by a visual reading. Pressure gradient was not performed on the lesion. IVUS was not performed. No optical coherence tomography (OCT) was performed. Dist LM to Ost LAD lesion is 100% stenosed. Vessel  is the culprit lesion. The lesion is type C, located at the major branch, focal, discrete and irregular. The lesion is moderately calcified. The lesion was not previously treated . The stenosis was measured by a visual reading. Pressure gradient was not performed on the lesion. IVUS was not performed. No optical coherence tomography (OCT) was performed.  Left Circumflex Ost Cx to Dist Cx lesion is 50%  stenosed. Vessel is not the culprit lesion. The lesion is type C, located at the major branch and concentric. The lesion is mildly calcified. The lesion was not previously treated . The stenosis was measured by a visual reading. Pressure gradient was not performed on the lesion. IVUS was not performed. No optical coherence tomography (OCT) was performed.  Right Coronary Artery Ost RCA to Prox RCA lesion is 90% stenosed. Vessel is not the culprit lesion. The lesion is type A, located at the major branch, focal, discrete and eccentric. The lesion is moderately calcified. The lesion was not previously treated . The stenosis was measured by a visual reading. Pressure gradient was not performed on the lesion. IVUS was not performed. No optical coherence tomography (OCT) was performed. Prox RCA to Mid RCA lesion is 50% stenosed. Vessel is not the culprit lesion. The lesion is type B1 and located at the major branch. The lesion was not previously treated . The stenosis was measured by a visual reading. Pressure gradient was not performed on the lesion. IVUS was not performed. No optical coherence tomography (OCT) was performed.  Intervention  No interventions have been documented.     ECHOCARDIOGRAM  ECHOCARDIOGRAM COMPLETE 10/02/2022  Narrative ECHOCARDIOGRAM REPORT    Patient Name:   Ruben [name] Date of Exam: 10/02/2022 Medical Rec #:  [id number]        Height:       68.0 in Accession #:    [id number]       Weight:       185.0 lb Date of Birth:  08/08/1945       BSA:          1.977 m Patient Age:    54 years         BP:           124/71 mmHg Patient Gender: M                HR:           56 bpm. Exam Location:  [facility]  Procedure: 2D Echo, Cardiac Doppler and Color Doppler  Indications:     NSTEMI  History:         Patient has prior history of Echocardiogram examinations, most recent 10/29/2020. Acute MI, TIA; Arrythmias:Tachycardia.  Sonographer:     Wenda Low Referring Phys:   [id number] Butler TANG Diagnosing Phys: Yolonda Kida MD   Sonographer Comments: Technically difficult study due to poor echo windows and suboptimal parasternal window. IMPRESSIONS   1. Left ventricular ejection fraction, by estimation, is 60 to 65%. The left ventricle has normal function. The left ventricle has no regional wall motion abnormalities. Left ventricular diastolic parameters are consistent with Grade I diastolic dysfunction (impaired relaxation). 2. Right ventricular systolic function is normal. The right ventricular size is normal. There is normal pulmonary artery systolic pressure. 3. The mitral valve is normal in structure. Trivial mitral valve regurgitation. 4. The aortic valve is normal in structure. Aortic valve regurgitation is not visualized.  FINDINGS Left Ventricle: Left ventricular ejection fraction, by estimation, is 60  to 65%. The left ventricle has normal function. The left ventricle has no regional wall motion abnormalities. The left ventricular internal cavity size was normal in size. There is no left ventricular hypertrophy. Left ventricular diastolic parameters are consistent with Grade I diastolic dysfunction (impaired relaxation).  Right Ventricle: The right ventricular size is normal. No increase in right ventricular wall thickness. Right ventricular systolic function is normal. There is normal pulmonary artery systolic pressure. The tricuspid regurgitant velocity is 1.59 m/s, and with an assumed right atrial pressure of 3 mmHg, the estimated right ventricular systolic pressure is [id number].  Left Atrium: Left atrial size was normal in size.  Right Atrium: Right atrial size was normal in size.  Pericardium: There is no evidence of pericardial effusion.  Mitral Valve: The mitral valve is normal in structure. There is moderate thickening of the mitral valve leaflet(s). Normal mobility of the mitral valve leaflets. Trivial mitral valve regurgitation.  MV peak gradient, 3.0 mmHg. The mean mitral valve gradient is 1.0 mmHg.  Tricuspid Valve: The tricuspid valve is normal in structure. Tricuspid valve regurgitation is trivial.  Aortic Valve: The aortic valve is normal in structure. Aortic valve regurgitation is not visualized. Aortic valve mean gradient measures 2.0 mmHg. Aortic valve peak gradient measures 3.2 mmHg. Aortic valve area, by VTI measures 2.51 cm.  Pulmonic Valve: The pulmonic valve was normal in structure. Pulmonic valve regurgitation is not visualized.  Aorta: The ascending aorta was not well visualized.  IAS/Shunts: No atrial level shunt detected by color flow Doppler.   LEFT VENTRICLE PLAX 2D LVIDd:         4.30 cm   Diastology LVIDs:         2.00 cm   LV e' medial:    7.18 cm/s LV PW:         1.00 cm   LV E/e' medial:  9.5 LV IVS:        0.80 cm   LV e' lateral:   8.70 cm/s LVOT diam:     1.90 cm   LV E/e' lateral: 7.8 LV SV:         50 LV SV Index:   25 LVOT Area:     2.84 cm   RIGHT VENTRICLE RV Basal diam:  2.85 cm RV Mid diam:    2.80 cm RV S prime:     15.60 cm/s TAPSE (M-mode): 2.2 cm  LEFT ATRIUM             Index        RIGHT ATRIUM           Index LA diam:        3.20 cm 1.62 cm/m   RA Area:     12.10 cm LA Vol (A2C):   64.3 ml 32.52 ml/m  RA Volume:   25.80 ml  13.05 ml/m LA Vol (A4C):   77.9 ml 39.40 ml/m LA Biplane Vol: 71.3 ml 36.06 ml/m AORTIC VALVE                    PULMONIC VALVE AV Area (Vmax):    2.57 cm     PV Vmax:       0.63 m/s AV Area (Vmean):   2.44 cm     PV Peak grad:  1.6 mmHg AV Area (VTI):     2.51 cm AV Vmax:           88.80 cm/s AV Vmean:  61.300 cm/s AV VTI:            0.199 m AV Peak Grad:      3.2 mmHg AV Mean Grad:      2.0 mmHg LVOT Vmax:         80.60 cm/s LVOT Vmean:        52.700 cm/s LVOT VTI:          0.176 m LVOT/AV VTI ratio: 0.88  AORTA Ao Root diam: 3.10 cm  MITRAL VALVE               TRICUSPID VALVE MV Area (PHT): 2.74 cm    TR  Peak grad:   10.1 mmHg MV Area VTI:   1.68 cm    TR Vmax:        159.00 cm/s MV Peak grad:  3.0 mmHg MV Mean grad:  1.0 mmHg    SHUNTS MV Vmax:       0.87 m/s    Systemic VTI:  0.18 m MV Vmean:      38.9 cm/s   Systemic Diam: 1.90 cm MV Decel Time: 277 msec MV E velocity: 68.10 cm/s MV A velocity: 84.90 cm/s MV E/A ratio:  0.80  Yolonda Kida MD Electronically signed by Yolonda Kida MD Signature Date/Time: 10/02/2022/1:47:29 PM    Final   TEE  ECHO INTRAOPERATIVE TEE 10/05/2022  Narrative *INTRAOPERATIVE TRANSESOPHAGEAL REPORT *    Patient Name:   Ruben [name] Date of Exam: 10/05/2022 Medical Rec #:  [id number]        Height:       68.0 in Accession #:    [id number]       Weight:       125.7 lb Date of Birth:  10-15-1945       BSA:          1.68 m Patient Age:    23 years         BP:           157/67 mmHg Patient Gender: M                HR:           89 bpm. Exam Location:  Anesthesiology  Transesophogeal exam was perform intraoperatively during surgical procedure. Patient was closely monitored under general anesthesia during the entirety of examination.  Indications:     I25.110 Atherosclerotic heart disease of native coronary artery with unstable angina pectoris Sonographer:     Roseanna Rainbow RDCS Performing Phys: [id number] Pollack Diagnosing Phys: Roderic Palau MD  Complications: No known complications during this procedure. POST-OP IMPRESSIONS Overall, there were no significant changes from pre-bypass.  PRE-OP FINDINGS Left Ventricle: The left ventricle has normal systolic function, with an ejection fraction of 60-65%. The cavity size was normal. There is moderately increased left ventricular wall thickness. No evidence of left ventricular regional wall motion abnormalities. There is moderate concentric left ventricular hypertrophy.   Right Ventricle: The right ventricle has normal systolic function. The cavity was normal. There is no increase  in right ventricular wall thickness.  Left Atrium: Left atrial size was not assessed. No left atrial/left atrial appendage thrombus was detected.  Right Atrium: Right atrial size was not assessed.  Interatrial Septum: No atrial level shunt detected by color flow Doppler.  Pericardium: There is no evidence of pericardial effusion.  Mitral Valve: The mitral valve is normal in structure. Mitral valve regurgitation is not visualized by color flow Doppler.  Tricuspid Valve: The tricuspid valve was normal in structure. Tricuspid valve regurgitation was not visualized by color flow Doppler.  Aortic Valve: The aortic valve is normal in structure. Aortic valve regurgitation was not visualized by color flow Doppler.   Pulmonic Valve: The pulmonic valve was normal in structure. Pulmonic valve regurgitation is mild by color flow Doppler.    Roderic Palau MD Electronically signed by Roderic Palau MD Signature Date/Time: 10/05/2022/12:55:36 PM    Final             Patient Profile     77 y.o. male post CABG 10/05/22, NSTEMI, post op AFIB, post op anemia.   Assessment & Plan    Anemia  - 1 unit PRBC 10/07/22.  Last hemoglobin 8.0  PAF  - IV amio, now PO 400 mg twice daily. NSR. Post op AFIB.   - On DC would give AMIO 200 bid for a week and then 200 mg a day thereafter. If in NSR in 2 months, DC amiodarone.   NSTEMI (65% EF)  - post CABG, ASA, STATIN, Bb low dose due to BP  - Now off of Lasix 40 IV -creatinine stable around 1.28, 1.29, 1.39  Encouraged protein use (BMI 20) Spoke to daughter, nurse. He is eating well.   He will continue to follow up with Dr. Saralyn Pilar in [place] as outpatient  For questions or updates, please contact [place] Please consult [email or web address] for contact info under        Signed, Candee Furbish, MD  10/09/2022, 9:57 AM

## 2022-10-09 NOTE — Care Management Important Message (Signed)
Important Message  Patient Details  Name: Ruben [name] MRN: [id number] Date of Birth: 03-04-1946   Medicare Important Message Given:  Yes     Orbie Pyo 10/09/2022, 12:42 PM

## 2022-10-09 NOTE — Progress Notes (Signed)
[address]       [place] [address]             [phone]      4 Days Post-Op Procedure(s) (LRB): CORONARY ARTERY BYPASS GRAFTING (CABG) X THREE BYPASSES USING OPEN LEFT INTERNAL MAMMARY ARTERY AND ENDOSCOPIC RIGHT GREATER SAPHENOUS VEIN HARVEST; CLIPPING OF ATRIAL APPENDAGE WITH 40MM ATRICLIP (N/A) TRANSESOPHAGEAL ECHOCARDIOGRAM (N/A) CLIPPING OF ATRIAL APPENDAGE Subjective: Transferred from ICU yesterday.  Awake, resting in bed. Feels he is progressing, minimal pain, no shortness of breath on RA.   Passing gas, no BM yet.  Objective: Vital signs in last 24 hours: Temp:  [98.1 F (36.7 C)-98.8 F (37.1 C)] 98.4 F (36.9 C) (03/22 0620) Pulse Rate:  [65-83] 83 (03/22 0005) Cardiac Rhythm: Normal sinus rhythm (03/21 1900) Resp:  [16-27] 17 (03/22 0620) BP: (119-126)/(44-68) 122/56 (03/22 0620) SpO2:  [96 %-100 %] 98 % (03/22 0620) Weight:  [58.5 kg] 58.5 kg (03/22 0620)     Intake/Output from previous day: 03/21 0701 - 03/22 0700 In: 131.9 [I.V.:131.9] Out: 400 [Urine:400] Intake/Output this shift: No intake/output data recorded.  General appearance: alert, cooperative, and no distress Neurologic: intact Heart: RRR, no further a-fib recorded on monitor Lungs: breath sounds are full and clear Abdomen: soft, no tenderness Wound: the sternal incision is well approximated and dry, RLE EVH incision is intact, expected bruising in right thigh. CT sutures are in place.  Lab Results: Recent Labs    10/08/22 0346 10/09/22 0615  WBC 5.7 5.0  HGB 7.4* 8.0*  HCT 21.7* 24.7*  PLT 87* 125*   BMET:  Recent Labs    10/08/22 0346 10/09/22 0615  NA 137 139  K 3.6 3.4*  CL 104 105  CO2 25 23  GLUCOSE 88 96  BUN 27* 21  CREATININE 1.29* 1.39*  CALCIUM 7.9* 8.1*    PT/INR: No results for input(s): "LABPROT", "INR" in the last 72 hours. ABG    Component Value Date/Time   PHART 7.392 10/05/2022 2023   HCO3 19.1 (L) 10/05/2022 2023    TCO2 20 (L) 10/05/2022 2023   ACIDBASEDEF 5.0 (H) 10/05/2022 2023   O2SAT 100 10/05/2022 2023   CBG (last 3)  Recent Labs    10/07/22 0754 10/07/22 1128 10/07/22 1550  GLUCAP 133* 132* 132*    Assessment/Plan: S/P Procedure(s) (LRB): CORONARY ARTERY BYPASS GRAFTING (CABG) X THREE BYPASSES USING OPEN LEFT INTERNAL MAMMARY ARTERY AND ENDOSCOPIC RIGHT GREATER SAPHENOUS VEIN HARVEST; CLIPPING OF ATRIAL APPENDAGE WITH 40MM ATRICLIP (N/A) TRANSESOPHAGEAL ECHOCARDIOGRAM (N/A) CLIPPING OF ATRIAL APPENDAGE  -POD 4 CABG x 3 and LAA clip after presenting with NSTEMI and recent episodes of atrial fibrillation. On ASA 81mg , Crestor.  Beta blocker has been held due to marginal BP but MAP now  65-75. Will start metoprolol at low dose. Add Plavix for NTEMI after Plt count has further recovered.     -Pre-and post-op atrial fibrillation- maintaining SR on oral amio. Correct K+.  -RENAL- Creat trending up likely relate to diuresis. Wt now ~3 Lbs above pre-op.   -GI- tolerating PO's and abdomen benign but no BM yet. Miralax today.   -HEME- expected acute blood loss anemia. Transfused 1 unit PRBC's in the ICU. Hct and plt count recovering. On Fe replacement.   -Disposition-planning for discharge to home where his wife will be assisting him. Anticipate he will be ready tomorrow.    LOS: 6 days    Antony Odea, PA-C 10/09/2022

## 2022-10-10 LAB — BASIC METABOLIC PANEL
Anion gap: 9 (ref 5–15)
BUN: 18 mg/dL (ref 8–23)
CO2: 22 mmol/L (ref 22–32)
Calcium: 8.2 mg/dL — ABNORMAL LOW (ref 8.9–10.3)
Chloride: 107 mmol/L (ref 98–111)
Creatinine, Ser: 1.15 mg/dL (ref 0.61–1.24)
GFR, Estimated: >60 mL/min (ref >60)
Glucose, Bld: 95 mg/dL (ref 70–99)
Potassium: 4 mmol/L (ref 3.5–5.1)
Sodium: 138 mmol/L (ref 135–145)

## 2022-10-10 LAB — CBC
HCT: 23.2 % — ABNORMAL LOW (ref 39.0–52.0)
Hemoglobin: 7.9 g/dL — ABNORMAL LOW (ref 13.0–17.0)
MCH: 33.1 pg (ref 26.0–34.0)
MCHC: 34.1 g/dL (ref 30.0–36.0)
MCV: 97.1 fL (ref 80.0–100.0)
Platelets: 138 10*3/uL — ABNORMAL LOW (ref 150–400)
RBC: 2.39 MIL/uL — ABNORMAL LOW (ref 4.22–5.81)
RDW: 21.1 % — ABNORMAL HIGH (ref 11.5–15.5)
WBC: 5.9 10*3/uL (ref 4.0–10.5)
nRBC: 0 % (ref 0.0–0.2)

## 2022-10-10 MED ORDER — APIXABAN 5 MG PO TABS
5.0000 mg | ORAL_TABLET | Freq: Two times a day (BID) | ORAL | Status: DC
  Administered 2022-10-10 – 2022-10-11 (×3): 5 mg via ORAL
  Filled 2022-10-10 (×3): qty 1

## 2022-10-10 MED ORDER — ZOLPIDEM TARTRATE 5 MG PO TABS
5.0000 mg | ORAL_TABLET | Freq: Every evening | ORAL | Status: DC | PRN
  Administered 2022-10-10: 5 mg via ORAL
  Filled 2022-10-10: qty 1

## 2022-10-10 MED ORDER — METOPROLOL TARTRATE 25 MG PO TABS
25.0000 mg | ORAL_TABLET | Freq: Two times a day (BID) | ORAL | Status: DC
  Administered 2022-10-10 – 2022-10-11 (×2): 25 mg via ORAL
  Filled 2022-10-10 (×2): qty 1

## 2022-10-10 NOTE — Progress Notes (Addendum)
Cardiac Rehab Phase 1 [phone]- Returned to ambulate patient.  Patient declined at this time stating he was up and around in room with his wife and tired from that, not ready to ambulate again in hall at this time. Patient states he was told he can ambulate with his wife, and he plans to walk again later when ready.  Moved patient's bed with assist from NT per his request for a better angle to the TV.  Sol Passer, MS, ACSM CEP

## 2022-10-10 NOTE — Progress Notes (Addendum)
ANTICOAGULATION CONSULT NOTE -   Pharmacy Consult for Eliquis  Indication: atrial fibrillation  Allergies  Allergen Reactions   Codeine Nausea Only   Naproxen Sodium Other (See Comments)    Bleeding ulcers    Patient Measurements: Height: 5\' 8"  (172.7 cm) Weight: 59.8 kg (131 lb 12.8 oz) IBW/kg (Calculated) : 68.4  Vital Signs: Temp: 98.1 F (36.7 C) (03/23 0800) Temp Source: Oral (03/23 0800) BP: 135/65 (03/23 0800) Pulse Rate: 72 (03/23 0800)  Labs: Recent Labs    10/08/22 0346 10/09/22 0615 10/10/22 0055  HGB 7.4* 8.0* 7.9*  HCT 21.7* 24.7* 23.2*  PLT 87* 125* 138*  CREATININE 1.29* 1.39* 1.15    Estimated Creatinine Clearance: 46.9 mL/min (by C-G formula based on SCr of 1.15 mg/dL).   Medical History: Past Medical History:  Diagnosis Date   TIA (transient ischemic attack)     Assessment: 36 YOM with hx of PAF with TIA in the past and refused anticoagulation, NSTEMI s/p CABG and LAA clipping on 10/05/22. Pharmacy consulted to start Eliquis per CTS on 3/22, planning to continue aspirin 81 mg daily and not adding clopidogrel. Scr 1.15, weight 59.8 kg. Hgb 7.9 stable, PLT 138.   Goal of Therapy:  Therapeutic anticoagulation Monitor platelets by anticoagulation protocol: Yes   Plan:  Start Eliquis 5 mg PO BID  Monitor renal function, s/sx bleeding  Eliseo Gum, PharmD PGY1 Pharmacy Resident   10/10/2022  10:48 AM

## 2022-10-10 NOTE — Progress Notes (Signed)
Cardiac Rehab Phase 1 [id number]- Attempted ambulation with patient. Patient states he walked at 0700 with Harle Battiest, NT. Patient states his feet hurt right now, and he would like to wait for ambulation. Encouraged patient to walk again with nursing staff when ready, and patient is amenable to this. Patient states he can't sleep due to monitor beeping. Right limb lead disconnected, reconnected lead and acknowledged alarm.  Sol Passer, MS, ACSM CEP

## 2022-10-10 NOTE — Progress Notes (Signed)
   Rounding Note    Patient Name: Ruben [name] Date of Encounter: 10/10/2022  [place] Cardiologist: None   Subjective   NAEO. Complains of poor sleep. In and out of AFL.  Vital Signs    Vitals:   10/09/22 1954 10/09/22 2319 10/10/22 0538 10/10/22 0800  BP: (!) 123/55 (!) 93/40 133/63 135/65  Pulse: 100 61 65 72  Resp: 16 16 19  (!) 21  Temp: 98.3 F (36.8 C) 98.3 F (36.8 C) 98.1 F (36.7 C) 98.1 F (36.7 C)  TempSrc: Oral Oral Oral Oral  SpO2: 96% 96% 94% 99%  Weight:   59.8 kg   Height:        Intake/Output Summary (Last 24 hours) at 10/10/2022 0832 Last data filed at 10/10/2022 [id number] Gross per 24 hour  Intake 600 ml  Output 925 ml  Net -325 ml      10/10/2022    5:38 AM 10/09/2022    6:20 AM 10/08/2022    5:00 AM  Last 3 Weights  Weight (lbs) 131 lb 12.8 oz 128 lb 15.5 oz 131 lb 6.4 oz  Weight (kg) 59.784 kg 58.5 kg 59.603 kg      Telemetry    Sinus and salvos of AF/AFL - Personally Reviewed  ECG    Personally Reviewed  Physical Exam   GEN: No acute distress.   Cardiac: irregularly irregular Psych: Normal affect   Assessment & Plan    #pAF/pAFL Seems like this predates the CABG.  Continue amiodarone 400mg  PO BID. At discharge, decrease to amiodarone 200mg  PO BID x 1 week then 200mg  PO daily thereafter.  Follow up with Dr Saralyn Pilar  #NSTEMI #CAD #s/p CABG Per CTS Cont ASA, statin, BB    For questions or updates, please contact [place] Please consult [email or web address] for contact info under        Signed, Vickie Epley, MD  10/10/2022, 8:32 AM

## 2022-10-10 NOTE — Progress Notes (Addendum)
[address]       [place] [address]             [phone]     5 Days Post-Op Procedure(s) (LRB): CORONARY ARTERY BYPASS GRAFTING (CABG) X THREE BYPASSES USING OPEN LEFT INTERNAL MAMMARY ARTERY AND ENDOSCOPIC RIGHT GREATER SAPHENOUS VEIN HARVEST; CLIPPING OF ATRIAL APPENDAGE WITH 40MM ATRICLIP (N/A) TRANSESOPHAGEAL ECHOCARDIOGRAM (N/A) CLIPPING OF ATRIAL APPENDAGE Subjective: Biggest c/o is unable to sleep  Objective: Vital signs in last 24 hours: Temp:  [97.7 F (36.5 C)-98.5 F (36.9 C)] 98.1 F (36.7 C) (03/23 0538) Pulse Rate:  [61-100] 65 (03/23 0538) Cardiac Rhythm: Normal sinus rhythm (03/23 0330) Resp:  [16-20] 19 (03/23 0538) BP: (93-136)/(40-64) 133/63 (03/23 0538) SpO2:  [93 %-99 %] 94 % (03/23 0538) Weight:  [59.8 kg] 59.8 kg (03/23 0538)  Hemodynamic parameters for last 24 hours:    Intake/Output from previous day: 03/22 0701 - 03/23 0700 In: 600 [P.O.:600] Out: 925 [Urine:925] Intake/Output this shift: No intake/output data recorded.  General appearance: alert, cooperative, and no distress Heart: regular rate and rhythm Lungs: clear to auscultation bilaterally Abdomen: benign Extremities: no edema Wound: incis healing well  Lab Results: Recent Labs    10/09/22 0615 10/10/22 0055  WBC 5.0 5.9  HGB 8.0* 7.9*  HCT 24.7* 23.2*  PLT 125* 138*   BMET:  Recent Labs    10/09/22 0615 10/10/22 0055  NA 139 138  K 3.4* 4.0  CL 105 107  CO2 23 22  GLUCOSE 96 95  BUN 21 18  CREATININE 1.39* 1.15  CALCIUM 8.1* 8.2*    PT/INR: No results for input(s): "LABPROT", "INR" in the last 72 hours. ABG    Component Value Date/Time   PHART 7.392 10/05/2022 2023   HCO3 19.1 (L) 10/05/2022 2023   TCO2 20 (L) 10/05/2022 2023   ACIDBASEDEF 5.0 (H) 10/05/2022 2023   O2SAT 100 10/05/2022 2023   CBG (last 3)  Recent Labs    10/07/22 0754 10/07/22 1128 10/07/22 1550  GLUCAP 133* 132* 132*    Meds Scheduled Meds:   acetaminophen  1,000 mg Oral Q6H   Or   acetaminophen (TYLENOL) oral liquid 160 mg/5 mL  1,000 mg Per Tube Q6H   amiodarone  400 mg Oral BID   aspirin EC  81 mg Oral Daily   Chlorhexidine Gluconate Cloth  6 each Topical Daily   Fe Fum-Vit C-Vit B12-FA  1 capsule Oral QPC breakfast   metoprolol tartrate  12.5 mg Oral BID   pantoprazole  40 mg Oral Daily   rosuvastatin  40 mg Oral QHS   Continuous Infusions: PRN Meds:.diphenhydrAMINE, ondansetron **OR** ondansetron (ZOFRAN) IV, oxyCODONE, senna-docusate, traMADol  Xrays No results found.  Assessment/Plan: S/P Procedure(s) (LRB): CORONARY ARTERY BYPASS GRAFTING (CABG) X THREE BYPASSES USING OPEN LEFT INTERNAL MAMMARY ARTERY AND ENDOSCOPIC RIGHT GREATER SAPHENOUS VEIN HARVEST; CLIPPING OF ATRIAL APPENDAGE WITH 40MM ATRICLIP (N/A) TRANSESOPHAGEAL ECHOCARDIOGRAM (N/A) CLIPPING OF ATRIAL APPENDAGE  POD#5  1 afeb, VSS , in afib/NSR- elev rate at times, on po amio and lopressor, most recent K+/Mg++ ok 2 O2 sats good on RA 3 voiding- not measured, weight about 3 kg>preop if accurate 4 renal fxn in normal range now 5 expected ABLA is pretty stable at H/H 7.9/23.2- on iron supplement 6 thrombocytopenia - reactive- trending higher over time, platelets138K today , low was 87K- will add plavix for non- STEMI soon 7 will try ambien for sleep aid tonight 8 pulm  hygiene and rehab, hopefully home tomorrow   D/W MD no plavix- will start eliquis, cont baby asa   LOS: 7 days    John Giovanni PA-C Pager [phone] 10/10/2022   Chart reviewed, patient examined, agree with above. He is doing well but still having some episodes of AF with RVR. Baseline is sinus 60's. Will continue amio loading and will try to increase Lopressor to 25 bid since his BP is fine. Start Eliquis. Plan home tomorrow.

## 2022-10-11 ENCOUNTER — Other Ambulatory Visit: Payer: Self-pay | Admitting: Surgical

## 2022-10-11 MED ORDER — CLOPIDOGREL BISULFATE 75 MG PO TABS: 75.0000 mg | ORAL_TABLET | Freq: Every day | ORAL | 11 refills | Status: DC

## 2022-10-11 MED ORDER — TRAMADOL HCL 50 MG PO TABS: 50.0000 mg | ORAL_TABLET | Freq: Four times a day (QID) | ORAL | 0 refills | Status: DC | PRN

## 2022-10-11 MED ORDER — ASPIRIN 81 MG PO TBEC: 81.0000 mg | DELAYED_RELEASE_TABLET | Freq: Every day | ORAL | 12 refills | Status: AC

## 2022-10-11 MED ORDER — METOPROLOL TARTRATE 25 MG PO TABS: 25.0000 mg | ORAL_TABLET | Freq: Two times a day (BID) | ORAL | 1 refills | Status: DC

## 2022-10-11 MED ORDER — APIXABAN 5 MG PO TABS: 5.0000 mg | ORAL_TABLET | Freq: Two times a day (BID) | ORAL | 1 refills | Status: AC

## 2022-10-11 MED ORDER — FE FUM-VIT C-VIT B12-FA 460-60-0.01-1 MG PO CAPS: 1.0000 | ORAL_CAPSULE | Freq: Every day | ORAL | 0 refills | Status: AC

## 2022-10-11 MED ORDER — AMIODARONE HCL 200 MG PO TABS: 200.0000 mg | ORAL_TABLET | Freq: Two times a day (BID) | ORAL | 2 refills | Status: AC

## 2022-10-11 NOTE — TOC Transition Note (Signed)
Transition of Care (TOC) - CM/SW Discharge Note Marvetta Gibbons RN, BSN Transitions of Care Unit 4E- RN Case Manager See Treatment Team for direct phone #   Patient Details  Name: Ruben [name] MRN: [id number] Date of Birth: 1946/07/02  Transition of Care [facility]) CM/SW Contact:  Dawayne Patricia, RN Phone Number: 10/11/2022, 4:16 PM   Clinical Narrative:    Pt stable for transition home today, [place] and DME needs have been arranged. [employer] delivered RW to room for discharge.  [place] arranged- with pre-op office protocol for [facility] needs with [facility]- Liaison has been following for discharge and will contact pt for scheduling.   No further TOC needs noted. Family to transport home.   Final next level of care: [place] Barriers to Discharge: Barriers Resolved   Patient Goals and CMS Choice   Choice offered to / list presented to : NA  Discharge Placement                 Home w/ [facility]        Discharge Plan and Services Additional resources added to the After Visit Summary for                  DME Arranged: [place] rolling DME Agency: [place]: [facility]     Representative spoke with at [place]: [place] ([employer]) Interventions SDOH Screenings   Tobacco Use: Low Risk  (10/07/2022)     Readmission Risk Interventions     No data to display

## 2022-10-11 NOTE — Progress Notes (Signed)
      [address]       [place] [address]             [phone]     6 Days Post-Op Procedure(s) (LRB): CORONARY ARTERY BYPASS GRAFTING (CABG) X THREE BYPASSES USING OPEN LEFT INTERNAL MAMMARY ARTERY AND ENDOSCOPIC RIGHT GREATER SAPHENOUS VEIN HARVEST; CLIPPING OF ATRIAL APPENDAGE WITH 40MM ATRICLIP (N/A) TRANSESOPHAGEAL ECHOCARDIOGRAM (N/A) CLIPPING OF ATRIAL APPENDAGE Subjective: Feels well, got a good nights sleep  Objective: Vital signs in last 24 hours: Temp:  [98.1 F (36.7 C)-98.4 F (36.9 C)] 98.4 F (36.9 C) (03/24 0610) Pulse Rate:  [65-72] 65 (03/24 0610) Cardiac Rhythm: Normal sinus rhythm (03/23 1936) Resp:  [18-21] 18 (03/24 0610) BP: (110-142)/(64-87) 142/64 (03/24 0610) SpO2:  [90 %-99 %] 96 % (03/24 0610) Weight:  [59.1 kg] 59.1 kg (03/24 0610)  Hemodynamic parameters for last 24 hours:    Intake/Output from previous day: 03/23 0701 - 03/24 0700 In: -  Out: 500 [Urine:500] Intake/Output this shift: No intake/output data recorded.  General appearance: alert, cooperative, and no distress Heart: regular rate and rhythm Lungs: mildly dim in bases Abdomen: benign Extremities: no edema Wound: incis healing well  Lab Results: Recent Labs    10/09/22 0615 10/10/22 0055  WBC 5.0 5.9  HGB 8.0* 7.9*  HCT 24.7* 23.2*  PLT 125* 138*   BMET:  Recent Labs    10/09/22 0615 10/10/22 0055  NA 139 138  K 3.4* 4.0  CL 105 107  CO2 23 22  GLUCOSE 96 95  BUN 21 18  CREATININE 1.39* 1.15  CALCIUM 8.1* 8.2*    PT/INR: No results for input(s): "LABPROT", "INR" in the last 72 hours. ABG    Component Value Date/Time   PHART 7.392 10/05/2022 2023   HCO3 19.1 (L) 10/05/2022 2023   TCO2 20 (L) 10/05/2022 2023   ACIDBASEDEF 5.0 (H) 10/05/2022 2023   O2SAT 100 10/05/2022 2023   CBG (last 3)  No results for input(s): "GLUCAP" in the last 72 hours.  Meds Scheduled Meds:  amiodarone  400 mg Oral BID   apixaban  5 mg Oral BID    aspirin EC  81 mg Oral Daily   Fe Fum-Vit C-Vit B12-FA  1 capsule Oral QPC breakfast   metoprolol tartrate  25 mg Oral BID   pantoprazole  40 mg Oral Daily   rosuvastatin  40 mg Oral QHS   Continuous Infusions: PRN Meds:.ondansetron **OR** ondansetron (ZOFRAN) IV, oxyCODONE, senna-docusate, traMADol, zolpidem  Xrays No results found.  Assessment/Plan: S/P Procedure(s) (LRB): CORONARY ARTERY BYPASS GRAFTING (CABG) X THREE BYPASSES USING OPEN LEFT INTERNAL MAMMARY ARTERY AND ENDOSCOPIC RIGHT GREATER SAPHENOUS VEIN HARVEST; CLIPPING OF ATRIAL APPENDAGE WITH 40MM ATRICLIP (N/A) TRANSESOPHAGEAL ECHOCARDIOGRAM (N/A) CLIPPING OF ATRIAL APPENDAGE POD#6  1 afeb, VSS, sinus rhythm 2 O2 sats good on RA 3 weight stable , voiding- not all measured 4 no new labs or Xrays 5 stable for d/c    LOS: 8 days    John Giovanni PA-C Pager [phone] 10/11/2022

## 2022-10-12 ENCOUNTER — Telehealth: Payer: Self-pay | Admitting: *Deleted

## 2022-10-12 NOTE — Telephone Encounter (Signed)
Patient's wife contacted the answering service over the weekend with concerns regarding amiodarone rx. Per wife, she spoke with on call provider who confirmed dosing to be 2 tablets BID for 7 days then decrease to 1 BID thereafter. Patient's wife also requesting rx for Ambien. Advised wife to contact patient's PCP for prescription. Wife verbalized understanding.

## 2022-10-13 DIAGNOSIS — Z8673 Personal history of transient ischemic attack (TIA), and cerebral infarction without residual deficits: Secondary | ICD-10-CM | POA: Diagnosis not present

## 2022-10-13 DIAGNOSIS — I214 Non-ST elevation (NSTEMI) myocardial infarction: Secondary | ICD-10-CM | POA: Diagnosis not present

## 2022-10-13 DIAGNOSIS — I480 Paroxysmal atrial fibrillation: Secondary | ICD-10-CM | POA: Diagnosis not present

## 2022-10-13 DIAGNOSIS — Z48812 Encounter for surgical aftercare following surgery on the circulatory system: Secondary | ICD-10-CM | POA: Diagnosis not present

## 2022-10-13 DIAGNOSIS — Z951 Presence of aortocoronary bypass graft: Secondary | ICD-10-CM | POA: Diagnosis not present

## 2022-10-13 DIAGNOSIS — Z7901 Long term (current) use of anticoagulants: Secondary | ICD-10-CM | POA: Diagnosis not present

## 2022-10-13 DIAGNOSIS — E785 Hyperlipidemia, unspecified: Secondary | ICD-10-CM | POA: Diagnosis not present

## 2022-10-13 DIAGNOSIS — Z7982 Long term (current) use of aspirin: Secondary | ICD-10-CM | POA: Diagnosis not present

## 2022-10-15 DIAGNOSIS — I214 Non-ST elevation (NSTEMI) myocardial infarction: Secondary | ICD-10-CM | POA: Diagnosis not present

## 2022-10-16 ENCOUNTER — Other Ambulatory Visit: Payer: Self-pay

## 2022-10-16 ENCOUNTER — Emergency Department (HOSPITAL_COMMUNITY): Payer: PPO

## 2022-10-16 ENCOUNTER — Encounter (HOSPITAL_COMMUNITY): Payer: Self-pay

## 2022-10-16 ENCOUNTER — Inpatient Hospital Stay (HOSPITAL_COMMUNITY)
Admission: EM | Admit: 2022-10-16 | Discharge: 2022-10-19 | DRG: 281 | Disposition: A | Discharge status: Home Health | Payer: PPO | Attending: Family Medicine | Admitting: Family Medicine

## 2022-10-16 DIAGNOSIS — R0602 Shortness of breath: Secondary | ICD-10-CM | POA: Diagnosis not present

## 2022-10-16 DIAGNOSIS — J918 Pleural effusion in other conditions classified elsewhere: Secondary | ICD-10-CM | POA: Diagnosis present

## 2022-10-16 DIAGNOSIS — I5033 Acute on chronic diastolic (congestive) heart failure: Secondary | ICD-10-CM | POA: Diagnosis not present

## 2022-10-16 DIAGNOSIS — R609 Edema, unspecified: Secondary | ICD-10-CM | POA: Diagnosis not present

## 2022-10-16 DIAGNOSIS — E7800 Pure hypercholesterolemia, unspecified: Secondary | ICD-10-CM | POA: Diagnosis not present

## 2022-10-16 DIAGNOSIS — R0902 Hypoxemia: Secondary | ICD-10-CM | POA: Diagnosis not present

## 2022-10-16 DIAGNOSIS — Z7901 Long term (current) use of anticoagulants: Secondary | ICD-10-CM

## 2022-10-16 DIAGNOSIS — R0601 Orthopnea: Secondary | ICD-10-CM | POA: Diagnosis not present

## 2022-10-16 DIAGNOSIS — I252 Old myocardial infarction: Secondary | ICD-10-CM

## 2022-10-16 DIAGNOSIS — Z951 Presence of aortocoronary bypass graft: Secondary | ICD-10-CM | POA: Diagnosis not present

## 2022-10-16 DIAGNOSIS — J90 Pleural effusion, not elsewhere classified: Secondary | ICD-10-CM | POA: Diagnosis not present

## 2022-10-16 DIAGNOSIS — I509 Heart failure, unspecified: Secondary | ICD-10-CM | POA: Diagnosis not present

## 2022-10-16 DIAGNOSIS — Z7982 Long term (current) use of aspirin: Secondary | ICD-10-CM

## 2022-10-16 DIAGNOSIS — Z8249 Family history of ischemic heart disease and other diseases of the circulatory system: Secondary | ICD-10-CM | POA: Diagnosis not present

## 2022-10-16 DIAGNOSIS — I482 Chronic atrial fibrillation, unspecified: Secondary | ICD-10-CM | POA: Diagnosis not present

## 2022-10-16 DIAGNOSIS — Z886 Allergy status to analgesic agent status: Secondary | ICD-10-CM

## 2022-10-16 DIAGNOSIS — Z79899 Other long term (current) drug therapy: Secondary | ICD-10-CM

## 2022-10-16 DIAGNOSIS — I3139 Other pericardial effusion (noninflammatory): Secondary | ICD-10-CM | POA: Diagnosis not present

## 2022-10-16 DIAGNOSIS — J939 Pneumothorax, unspecified: Secondary | ICD-10-CM | POA: Diagnosis present

## 2022-10-16 DIAGNOSIS — I480 Paroxysmal atrial fibrillation: Secondary | ICD-10-CM | POA: Diagnosis not present

## 2022-10-16 DIAGNOSIS — I5031 Acute diastolic (congestive) heart failure: Secondary | ICD-10-CM | POA: Diagnosis not present

## 2022-10-16 DIAGNOSIS — I2510 Atherosclerotic heart disease of native coronary artery without angina pectoris: Secondary | ICD-10-CM | POA: Diagnosis present

## 2022-10-16 DIAGNOSIS — Z8673 Personal history of transient ischemic attack (TIA), and cerebral infarction without residual deficits: Secondary | ICD-10-CM

## 2022-10-16 DIAGNOSIS — Z8711 Personal history of peptic ulcer disease: Secondary | ICD-10-CM | POA: Diagnosis not present

## 2022-10-16 DIAGNOSIS — I214 Non-ST elevation (NSTEMI) myocardial infarction: Secondary | ICD-10-CM | POA: Diagnosis present

## 2022-10-16 DIAGNOSIS — R7989 Other specified abnormal findings of blood chemistry: Secondary | ICD-10-CM | POA: Diagnosis not present

## 2022-10-16 DIAGNOSIS — K279 Peptic ulcer, site unspecified, unspecified as acute or chronic, without hemorrhage or perforation: Secondary | ICD-10-CM | POA: Diagnosis present

## 2022-10-16 DIAGNOSIS — Z885 Allergy status to narcotic agent status: Secondary | ICD-10-CM

## 2022-10-16 DIAGNOSIS — J9811 Atelectasis: Secondary | ICD-10-CM | POA: Diagnosis not present

## 2022-10-16 LAB — CBC WITH DIFFERENTIAL/PLATELET
Abs Immature Granulocytes: 0.04 10*3/uL (ref 0.00–0.07)
Basophils Absolute: 0 10*3/uL (ref 0.0–0.1)
Basophils Relative: 0 %
Eosinophils Absolute: 0 10*3/uL (ref 0.0–0.5)
Eosinophils Relative: 0 %
HCT: 29.5 % — ABNORMAL LOW (ref 39.0–52.0)
Hemoglobin: 9 g/dL — ABNORMAL LOW (ref 13.0–17.0)
Immature Granulocytes: 0 %
Lymphocytes Relative: 12 %
Lymphs Abs: 1.2 10*3/uL (ref 0.7–4.0)
MCH: 33 pg (ref 26.0–34.0)
MCHC: 30.5 g/dL (ref 30.0–36.0)
MCV: 108.1 fL — ABNORMAL HIGH (ref 80.0–100.0)
Monocytes Absolute: 0.6 10*3/uL (ref 0.1–1.0)
Monocytes Relative: 6 %
Neutro Abs: 8.3 10*3/uL — ABNORMAL HIGH (ref 1.7–7.7)
Neutrophils Relative %: 82 %
Platelets: 435 10*3/uL — ABNORMAL HIGH (ref 150–400)
RBC: 2.73 MIL/uL — ABNORMAL LOW (ref 4.22–5.81)
RDW: 20.9 % — ABNORMAL HIGH (ref 11.5–15.5)
WBC: 10.1 10*3/uL (ref 4.0–10.5)
nRBC: 0 % (ref 0.0–0.2)

## 2022-10-16 LAB — COMPREHENSIVE METABOLIC PANEL WITH GFR
ALT: 42 U/L (ref 0–44)
AST: 43 U/L — ABNORMAL HIGH (ref 15–41)
Albumin: 3.2 g/dL — ABNORMAL LOW (ref 3.5–5.0)
Alkaline Phosphatase: 27 U/L — ABNORMAL LOW (ref 38–126)
Anion gap: 15 (ref 5–15)
BUN: 13 mg/dL (ref 8–23)
CO2: 20 mmol/L — ABNORMAL LOW (ref 22–32)
Calcium: 9.2 mg/dL (ref 8.9–10.3)
Chloride: 107 mmol/L (ref 98–111)
Creatinine, Ser: 1.26 mg/dL — ABNORMAL HIGH (ref 0.61–1.24)
GFR, Estimated: 59 mL/min — ABNORMAL LOW
Glucose, Bld: 101 mg/dL — ABNORMAL HIGH (ref 70–99)
Potassium: 4.6 mmol/L (ref 3.5–5.1)
Sodium: 142 mmol/L (ref 135–145)
Total Bilirubin: 1.1 mg/dL (ref 0.3–1.2)
Total Protein: 6.6 g/dL (ref 6.5–8.1)

## 2022-10-16 LAB — TROPONIN I (HIGH SENSITIVITY)
Troponin I (High Sensitivity): 80 ng/L — ABNORMAL HIGH (ref <18)
Troponin I (High Sensitivity): 99 ng/L — ABNORMAL HIGH (ref <18)
Troponin I (High Sensitivity): 113 ng/L (ref <18)

## 2022-10-16 LAB — CBC
HCT: 27.9 % — ABNORMAL LOW (ref 39.0–52.0)
Hemoglobin: 8.9 g/dL — ABNORMAL LOW (ref 13.0–17.0)
MCH: 32.8 pg (ref 26.0–34.0)
MCHC: 31.9 g/dL (ref 30.0–36.0)
MCV: 103 fL — ABNORMAL HIGH (ref 80.0–100.0)
Platelets: 414 10*3/uL — ABNORMAL HIGH (ref 150–400)
RBC: 2.71 MIL/uL — ABNORMAL LOW (ref 4.22–5.81)
RDW: 20.9 % — ABNORMAL HIGH (ref 11.5–15.5)
WBC: 10 10*3/uL (ref 4.0–10.5)
nRBC: 0 % (ref 0.0–0.2)

## 2022-10-16 LAB — BRAIN NATRIURETIC PEPTIDE: B Natriuretic Peptide: 256.2 pg/mL — ABNORMAL HIGH (ref 0.0–100.0)

## 2022-10-16 MED ORDER — POTASSIUM CHLORIDE CRYS ER 20 MEQ PO TBCR
20.0000 meq | EXTENDED_RELEASE_TABLET | Freq: Every day | ORAL | Status: DC
  Administered 2022-10-17 – 2022-10-19 (×3): 20 meq via ORAL
  Filled 2022-10-16 (×3): qty 1

## 2022-10-16 MED ORDER — ONDANSETRON HCL 4 MG PO TABS: 4.0000 mg | ORAL_TABLET | Freq: Four times a day (QID) | ORAL | Status: DC | PRN

## 2022-10-16 MED ORDER — ONDANSETRON HCL 4 MG/2ML IJ SOLN: 4.0000 mg | Freq: Four times a day (QID) | INTRAMUSCULAR | Status: DC | PRN

## 2022-10-16 MED ORDER — FUROSEMIDE 10 MG/ML IJ SOLN
40.0000 mg | Freq: Two times a day (BID) | INTRAMUSCULAR | Status: DC
  Administered 2022-10-17: 40 mg via INTRAVENOUS
  Filled 2022-10-16: qty 4

## 2022-10-16 MED ORDER — ACETAMINOPHEN 325 MG PO TABS
650.0000 mg | ORAL_TABLET | Freq: Once | ORAL | Status: AC
  Administered 2022-10-16: 650 mg via ORAL
  Filled 2022-10-16: qty 2

## 2022-10-16 MED ORDER — LEVALBUTEROL HCL 0.63 MG/3ML IN NEBU: 0.6300 mg | INHALATION_SOLUTION | Freq: Four times a day (QID) | RESPIRATORY_TRACT | Status: DC | PRN

## 2022-10-16 MED ORDER — ACETAMINOPHEN 650 MG RE SUPP: 650.0000 mg | Freq: Four times a day (QID) | RECTAL | Status: DC | PRN

## 2022-10-16 MED ORDER — LORAZEPAM 2 MG/ML IJ SOLN
0.5000 mg | Freq: Once | INTRAMUSCULAR | Status: AC
  Administered 2022-10-16: 0.5 mg via INTRAVENOUS
  Filled 2022-10-16: qty 1

## 2022-10-16 MED ORDER — HYDROMORPHONE HCL 1 MG/ML IJ SOLN: 0.5000 mg | INTRAMUSCULAR | Status: DC | PRN

## 2022-10-16 MED ORDER — ALBUTEROL SULFATE HFA 108 (90 BASE) MCG/ACT IN AERS: 2.0000 | INHALATION_SPRAY | RESPIRATORY_TRACT | Status: DC | PRN

## 2022-10-16 MED ORDER — ACETAMINOPHEN 325 MG PO TABS
650.0000 mg | ORAL_TABLET | Freq: Four times a day (QID) | ORAL | Status: DC | PRN
  Administered 2022-10-18 (×2): 650 mg via ORAL
  Filled 2022-10-16 (×2): qty 2

## 2022-10-16 MED ORDER — FENTANYL CITRATE PF 50 MCG/ML IJ SOSY
50.0000 ug | PREFILLED_SYRINGE | Freq: Once | INTRAMUSCULAR | Status: AC
  Administered 2022-10-16: 50 ug via INTRAVENOUS
  Filled 2022-10-16: qty 1

## 2022-10-16 MED ORDER — POTASSIUM CHLORIDE CRYS ER 20 MEQ PO TBCR
40.0000 meq | EXTENDED_RELEASE_TABLET | Freq: Once | ORAL | Status: AC
  Administered 2022-10-16: 40 meq via ORAL
  Filled 2022-10-16: qty 2

## 2022-10-16 MED ORDER — FUROSEMIDE 10 MG/ML IJ SOLN
40.0000 mg | Freq: Once | INTRAMUSCULAR | Status: AC
  Administered 2022-10-16: 40 mg via INTRAVENOUS
  Filled 2022-10-16: qty 4

## 2022-10-16 NOTE — ED Notes (Signed)
Pt ambulated while on pulse oximetry, saturation remained above 97%

## 2022-10-16 NOTE — ED Notes (Signed)
Ordered a tray will be up in 45 min

## 2022-10-16 NOTE — H&P (Signed)
History and Physical    [name] [id number] DOB: [birth year] DOA: 10/16/2022  PCP: [name], MD  Patient coming from: home  I have personally briefly reviewed patient's old medical records in [place]  Chief Complaint: sob/orthopnea Ruben [name] discomfort ,with recumbent hypoxemia  HPI: Ruben [name] is a 77 y.o. male with medical history significant of  TIA, atrial fibrillation, peptic ulcer disease, hyperlipidemia recent admission 3/16-3/24 for diagnosis of NSTEMI for which during that admission patient underwent 3vCABG  as well as application of left atrial appendage clip on 10/05/22 by Dr Cyndia Bent. Patient now presents to ED with complaint of sob and orthopnea  and chest discomfort with movement. Patient states symptoms start after CABG. He notes when he would lay flat he would feel as if he is suffocating. He states that this occurs when laying on back or on sides. He notes that at time symptoms are worse that others. He notes he notices symptoms more at night and states due to this he has been unable to sleep. He notes no chest discomfort with ambulation or shortness of breath. Per chart family also noted patient had cough and wheezing and was placed on steroids by his pcp. This however this did not improve his symptoms. Patient also notes hx of lasix use prior to surgery, however per noted he was not discharged on lasix due bump in cr.  Currently patient is s/p lasix in ED,he noted currently his breathing feels stable.   ED Course:  Vitals: afeb, bp 143/64, hr 66, rr 18 , sat 100% on ra  EKG: nsr, t wave abn inferior lat , change from prior EKG [id number]: Bilateral pleural effusions are noted, left greater than right. Probable mild left basilar subsegmental atelectasis. WbC: 10.1, Hgb 9 ( MCV 108.10=) plt 435  N a142, K 4.6, cl 107, bicarb 20 glu 101 cr 1.26 ( 1.15) ast 43 alphos 27  Ce 80, 99 Bnp 256.2 Tx lasix 40 mg iv , ativan 0.5 mg , fentanyl 76mcg ,  tylenol 650   Review of Systems: As per HPI otherwise 10 point review of systems negative.   Past Medical History:  Diagnosis Date   TIA (transient ischemic attack)     Past Surgical History:  Procedure Laterality Date   CLIPPING OF ATRIAL APPENDAGE  10/05/2022   Procedure: CLIPPING OF ATRIAL APPENDAGE;  Surgeon: Gaye Pollack, MD;  Location: [facility] OR;  Service: Open Heart Surgery;;   CORONARY ARTERY BYPASS GRAFT N/A 10/05/2022   Procedure: CORONARY ARTERY BYPASS GRAFTING (CABG) X THREE BYPASSES USING OPEN LEFT INTERNAL MAMMARY ARTERY AND ENDOSCOPIC RIGHT GREATER SAPHENOUS VEIN HARVEST; CLIPPING OF ATRIAL APPENDAGE WITH 40MM ATRICLIP;  Surgeon: Gaye Pollack, MD;  Location: [facility];  Service: Open Heart Surgery;  Laterality: N/A;   LEFT HEART CATH AND CORONARY ANGIOGRAPHY N/A 10/02/2022   Procedure: LEFT HEART CATH AND CORONARY ANGIOGRAPHY;  Surgeon: Yolonda Kida, MD;  Location: [facility];  Service: Cardiovascular;  Laterality: N/A;   SHOULDER ARTHROSCOPY     TEE WITHOUT CARDIOVERSION N/A 10/05/2022   Procedure: TRANSESOPHAGEAL ECHOCARDIOGRAM;  Surgeon: Gaye Pollack, MD;  Location: [facility] OR;  Service: Open Heart Surgery;  Laterality: N/A;     reports that he has never smoked. He has never used smokeless tobacco. He reports that he does not currently use alcohol. He reports that he does not use drugs.  Allergies  Allergen Reactions   Codeine Nausea Only   Naproxen Sodium Other (See Comments)  Bleeding ulcers    Family History  Problem Relation Age of Onset   Colon cancer Mother    Heart disease Father     Prior to Admission medications   Medication Sig Start Date End Date Taking? Authorizing Provider  amiodarone (PACERONE) 200 MG tablet Take 1 tablet (200 mg total) by mouth 2 (two) times daily. For 7 days then reduce the dose to 1 tablet (200 mg) twice daily. 10/11/22   Gold, Wilder Glade, PA-C  apixaban (ELIQUIS) 5 MG TABS tablet Take 1 tablet (5 mg total) by mouth 2  (two) times daily. 10/11/22   John Giovanni, PA-C  aspirin EC 81 MG tablet Take 1 tablet (81 mg total) by mouth daily. Swallow whole. 10/11/22   John Giovanni, PA-C  cholecalciferol (VITAMIN D3) 25 MCG (1000 UNIT) tablet Take 1,000 Units by mouth daily.    [provider]  Fe Fum-Vit C-Vit B12-FA (TRIGELS-F FORTE) CAPS capsule Take 1 capsule by mouth daily after breakfast. 10/11/22 11/10/22  Jadene Pierini E, PA-C  metoprolol tartrate (LOPRESSOR) 25 MG tablet Take 1 tablet (25 mg total) by mouth 2 (two) times daily. 10/11/22   Gold, Wilder Glade, PA-C  Polyvinyl Alcohol-Povidone (REFRESH OP) Place 1 drop into both eyes as needed (dry eye).    [provider]  rosuvastatin (CRESTOR) 20 MG tablet Take 2 tablets (40 mg total) by mouth at bedtime. Patient not taking: Reported on 10/04/2022 10/02/22 10/02/23  Callwood, Karma Greaser D, MD  traMADol (ULTRAM) 50 MG tablet Take 1 tablet (50 mg total) by mouth every 6 (six) hours as needed for up to 7 days for moderate pain. 10/11/22 10/18/22  John Giovanni, PA-C  vitamin B-12 (CYANOCOBALAMIN) 1000 MCG tablet Take 1,000 mcg by mouth daily.    [provider]  vitamin C (ASCORBIC ACID) 250 MG tablet Take 250 mg by mouth daily.    [provider]    Physical Exam: Vitals:   10/16/22 1815 10/16/22 1825 10/16/22 1946 10/16/22 1948  BP: (!) 159/64   (!) 157/79  Pulse: 66   67  Resp: (!) 22   17  Temp:   98 F (36.7 C)   TempSrc:   Axillary   SpO2: 100%   95%  Weight:  59 kg    Height:  5\' 8"  (1.727 m)      Constitutional: NAD, calm, comfortable, mild conversation dyspnea Vitals:   10/16/22 1815 10/16/22 1825 10/16/22 1946 10/16/22 1948  BP: (!) 159/64   (!) 157/79  Pulse: 66   67  Resp: (!) 22   17  Temp:   98 F (36.7 C)   TempSrc:   Axillary   SpO2: 100%   95%  Weight:  59 kg    Height:  5\' 8"  (1.727 m)     Eyes: PERRL, lids and conjunctivae normal ENMT: Mucous membranes are moist. Posterior pharynx clear of any exudate or  lesions.Normal dentition.  Neck: normal, supple, no masses, no thyromegaly Respiratory: clear to auscultation bilaterally, no wheezing, no crackles. Diminished bs at bases. Normal respiratory effort at rest. Mild conversation doe, No accessory muscle use.  Cardiovascular: Regular rate and rhythm, no murmurs / rubs / gallops. Trace lower extremity edema on right 2+ pedal pulses.  Abdomen: no tenderness, no masses palpated. No hepatosplenomegaly. Bowel sounds positive.  Musculoskeletal: no clubbing / cyanosis. No joint deformity upper and lower extremities. Good ROM, no contractures. Normal muscle tone.  Skin: no rashes, lesions, ulcers. No induration Neurologic: CN  2-12 grossly intact. Sensation intact,  Strength 5/5 in all 4.  Psychiatric: Normal judgment and insight. Alert and oriented x 3. Normal mood.    Labs on Admission: I have personally reviewed following labs and imaging studies  CBC: Recent Labs  Lab 10/10/22 0055 10/16/22 1446  WBC 5.9 10.1  NEUTROABS  --  8.3*  HGB 7.9* 9.0*  HCT 23.2* 29.5*  MCV 97.1 108.1*  PLT 138* [id number]*   Basic Metabolic Panel: Recent Labs  Lab 10/10/22 0055 10/16/22 1446  NA 138 142  K 4.0 4.6  CL 107 107  CO2 22 20*  GLUCOSE 95 101*  BUN 18 13  CREATININE 1.15 1.26*  CALCIUM 8.2* 9.2   GFR: Estimated Creatinine Clearance: 42.3 mL/min (A) (by C-G formula based on SCr of 1.26 mg/dL (H)). Liver Function Tests: Recent Labs  Lab 10/16/22 1446  AST 43*  ALT 42  ALKPHOS 27*  BILITOT 1.1  PROT 6.6  ALBUMIN 3.2*   No results for input(s): "LIPASE", "AMYLASE" in the last 168 hours. No results for input(s): "AMMONIA" in the last 168 hours. Coagulation Profile: No results for input(s): "INR", "PROTIME" in the last 168 hours. Cardiac Enzymes: No results for input(s): "CKTOTAL", "CKMB", "CKMBINDEX", "TROPONINI" in the last 168 hours. BNP (last 3 results) No results for input(s): "PROBNP" in the last 8760 hours. HbA1C: No results for  input(s): "HGBA1C" in the last 72 hours. CBG: No results for input(s): "GLUCAP" in the last 168 hours. Lipid Profile: No results for input(s): "CHOL", "HDL", "LDLCALC", "TRIG", "CHOLHDL", "LDLDIRECT" in the last 72 hours. Thyroid Function Tests: No results for input(s): "TSH", "T4TOTAL", "FREET4", "T3FREE", "THYROIDAB" in the last 72 hours. Anemia Panel: No results for input(s): "VITAMINB12", "FOLATE", "FERRITIN", "TIBC", "IRON", "RETICCTPCT" in the last 72 hours. Urine analysis:    Component Value Date/Time   COLORURINE YELLOW 10/04/2022 2127   APPEARANCEUR CLEAR 10/04/2022 2127   LABSPEC 1.010 10/04/2022 2127   PHURINE 7.0 10/04/2022 2127   GLUCOSEU NEGATIVE 10/04/2022 2127   HGBUR SMALL (A) 10/04/2022 2127   BILIRUBINUR NEGATIVE 10/04/2022 2127   KETONESUR NEGATIVE 10/04/2022 2127   PROTEINUR NEGATIVE 10/04/2022 2127   NITRITE NEGATIVE 10/04/2022 2127   LEUKOCYTESUR NEGATIVE 10/04/2022 2127    Radiological Exams on Admission: DG Chest 2 View  Result Date: 10/16/2022 CLINICAL DATA:  Shortness of breath. EXAM: CHEST - 2 VIEW COMPARISON:  October 07, 2022. FINDINGS: The heart size and mediastinal contours are within normal limits. Status post coronary artery bypass graft. Bilateral pleural effusions are noted, left greater than right. Probable mild left basilar subsegmental atelectasis is noted. The visualized skeletal structures are unremarkable. IMPRESSION: Bilateral pleural effusions are noted, left greater than right. Probable mild left basilar subsegmental atelectasis. Electronically Signed   By: Marijo Conception M.D.   On: 10/16/2022 13:57    EKG: Independently reviewed. See above   Assessment/Plan  Post CABG b/l Pleural Effusion with Recumbent Hypoxemia  -concern for CHF/ as well as Pericardial effusion  -CT thorax for further evaluation  as CABG 10 days ago  - Cardiology consult  -Per CT surgery trial of lasix iv  with quick transition to oral -await further recs   -supplemental O2   CAD s/p NSTEMI s/p 3V CABG 10/05/22 -admit with orthopnea/b/l pleural effusion/elevated CE -EKG Changes  -noted t wave flattening and inversion new from prior  -CE elevated to 80-then 99  - patient without current complaint of cardiac chest pain  - echo pending  -cardiology to see and  leave final recs  -continue ASA, metoprolol , crestor  Atrial fibrillation  -s/p LAA clip , on Eliquis resume in am in no procedure planned - continue on amiodarone started post op cabg -of note patient has hx of afib prior to CABG   TIA -continue on secondary ppx   Peptic ulcer disease -ppi  Hyperlipidemia  -continue statin   Please resume medications once med rec has been completed , resume Eliquis if no procedure planned  DVT prophylaxis: on Eliquis Code Status: full/ as discussed per patient wishes in event of cardiac arrest  Family Communication: Disposition Plan: patient  expected to be admitted greater than 2 midnights  Consults called: cardiology Admission status: cardiac tele   Clance Boll MD [facility]   If 7PM-7AM, please contact night-coverage [email or web address] Password [facility]  10/16/2022, 8:12 PM

## 2022-10-16 NOTE — ED Notes (Signed)
ED TO INPATIENT HANDOFF REPORT  ED Nurse Name and Phone #: Gardiner Coins  S Name/Age/Gender Ruben [name] 77 y.o. male Room/Bed: 007C/007C  Code Status   Code Status: Prior  Home/SNF/Other Home Patient oriented to: self, place, time, and situation Is this baseline? Yes   Triage Complete: Triage complete  Chief Complaint Orthopnea [R06.01]  Triage Note Pt came in via POV d/t not being able to sleep d/t difficulty breathing while laying down. Family reports his O2 was going down in the 70's while he lays flat. A/Ox4, denies pain, minimal exertional SOB & chest tightness. Pt had a bypass done 10 days ago & this had never happened prior.      Allergies Allergies  Allergen Reactions   Codeine Nausea Only   Naproxen Sodium Other (See Comments)    Bleeding ulcers    Level of Care/Admitting Diagnosis ED Disposition   ED Disposition: Admit Condition: None Comment: Hospital Area: [facility] [[id number]]  Level of Care: Telemetry Cardiac [103]  May admit patient to [facility] or [facility] if equivalent level of care is available:: No  Covid Evaluation: Asymptomatic - no recent exposure (last 10 days) testing not required  Diagnosis: Orthopnea [786.02.ICD-9-CM]  Admitting Physician: Clance Boll [id number]  Attending Physician: Clance Boll [id number]  Certification:: I certify this patient will need inpatient services for at least 2 midnights  Estimated Length of Stay: 3      B Medical/Surgery History Past Medical History:  Diagnosis Date   TIA (transient ischemic attack)    Past Surgical History:  Procedure Laterality Date   CLIPPING OF ATRIAL APPENDAGE  10/05/2022   Procedure: CLIPPING OF ATRIAL APPENDAGE;  Surgeon: Gaye Pollack, MD;  Location: [place];  Service: Open Heart Surgery;;   CORONARY ARTERY BYPASS GRAFT N/A 10/05/2022   Procedure: CORONARY ARTERY BYPASS GRAFTING (CABG) X THREE BYPASSES USING OPEN LEFT INTERNAL MAMMARY  ARTERY AND ENDOSCOPIC RIGHT GREATER SAPHENOUS VEIN HARVEST; CLIPPING OF ATRIAL APPENDAGE WITH 40MM ATRICLIP;  Surgeon: Gaye Pollack, MD;  Location: [place];  Service: Open Heart Surgery;  Laterality: N/A;   LEFT HEART CATH AND CORONARY ANGIOGRAPHY N/A 10/02/2022   Procedure: LEFT HEART CATH AND CORONARY ANGIOGRAPHY;  Surgeon: Yolonda Kida, MD;  Location: [place] [facility];  Service: Cardiovascular;  Laterality: N/A;   SHOULDER ARTHROSCOPY     TEE WITHOUT CARDIOVERSION N/A 10/05/2022   Procedure: TRANSESOPHAGEAL ECHOCARDIOGRAM;  Surgeon: Gaye Pollack, MD;  Location: [facility] OR;  Service: Open Heart Surgery;  Laterality: N/A;     A IV Location/Drains/Wounds Patient Lines/Drains/Airways Status     Active Line/Drains/Airways     Name Placement date Placement time Site Days   Peripheral IV 10/16/22 20 G Left Antecubital 10/16/22  1738  Antecubital  less than 1            Intake/Output Last 24 hours No intake or output data in the 24 hours ending 10/16/22 2056  Labs/Imaging Results for orders placed or performed during the hospital encounter of 10/16/22 (from the past 48 hour(s))  CBC with Differential     Status: Abnormal   Collection Time: 10/16/22  2:46 PM  Result Value Ref Range   WBC 10.1 4.0 - 10.5 K/uL   RBC 2.73 (L) 4.22 - 5.81 MIL/uL   Hemoglobin 9.0 (L) 13.0 - 17.0 g/dL   HCT 29.5 (L) 39.0 - 52.0 %   MCV 108.1 (H) 80.0 - 100.0 fL   MCH 33.0 26.0 -  34.0 pg   MCHC 30.5 30.0 - 36.0 g/dL   RDW 20.9 (H) 11.5 - 15.5 %   Platelets 435 (H) 150 - 400 K/uL   nRBC 0.0 0.0 - 0.2 %   Neutrophils Relative % 82 %   Neutro Abs 8.3 (H) 1.7 - 7.7 K/uL   Lymphocytes Relative 12 %   Lymphs Abs 1.2 0.7 - 4.0 K/uL   Monocytes Relative 6 %   Monocytes Absolute 0.6 0.1 - 1.0 K/uL   Eosinophils Relative 0 %   Eosinophils Absolute 0.0 0.0 - 0.5 K/uL   Basophils Relative 0 %   Basophils Absolute 0.0 0.0 - 0.1 K/uL   Immature Granulocytes 0 %   Abs Immature Granulocytes 0.04 0.00 -  0.07 K/uL    Comment: Performed at [facility] [address], [place], [place] [address]  Brain natriuretic peptide     Status: Abnormal   Collection Time: 10/16/22  2:46 PM  Result Value Ref Range   B Natriuretic Peptide 256.2 (H) 0.0 - 100.0 pg/mL    Comment: Performed at [facility] [address], [place], [place] [address]  Comprehensive metabolic panel     Status: Abnormal   Collection Time: 10/16/22  2:46 PM  Result Value Ref Range   Sodium 142 135 - 145 mmol/L   Potassium 4.6 3.5 - 5.1 mmol/L   Chloride 107 98 - 111 mmol/L   CO2 20 (L) 22 - 32 mmol/L   Glucose, Bld 101 (H) 70 - 99 mg/dL    Comment: Glucose reference range applies only to samples taken after fasting for at least 8 hours.   BUN 13 8 - 23 mg/dL   Creatinine, Ser 1.26 (H) 0.61 - 1.24 mg/dL   Calcium 9.2 8.9 - 10.3 mg/dL   Total Protein 6.6 6.5 - 8.1 g/dL   Albumin 3.2 (L) 3.5 - 5.0 g/dL   AST 43 (H) 15 - 41 U/L   ALT 42 0 - 44 U/L   Alkaline Phosphatase 27 (L) 38 - 126 U/L   Total Bilirubin 1.1 0.3 - 1.2 mg/dL   GFR, Estimated 59 (L) >60 mL/min    Comment: (NOTE) Calculated using the CKD-EPI Creatinine Equation (2021)    Anion gap 15 5 - 15    Comment: Performed at [facility] [address], [place], [place] [address]  Troponin I (High Sensitivity)     Status: Abnormal   Collection Time: 10/16/22  2:46 PM  Result Value Ref Range   Troponin I (High Sensitivity) 80 (H) <18 ng/L    Comment: (NOTE) Elevated high sensitivity troponin I (hsTnI) values and significant  changes across serial measurements may suggest ACS but many other  chronic and acute conditions are known to elevate hsTnI results.  Refer to the "Links" section for chest pain algorithms and additional  guidance. Performed at [facility], [place] [address] [place],  [address]   Troponin I (High Sensitivity)     Status: Abnormal   Collection Time: 10/16/22  5:53 PM  Result Value Ref Range   Troponin I  (High Sensitivity) 99 (H) <18 ng/L    Comment: RESULT CALLED TO, READ BACK BY AND VERIFIED WITH Jeneen Doutt RN 10/16/22 2002 M KOROLESKI (NOTE) Elevated high sensitivity troponin I (hsTnI) values and significant  changes across serial measurements may suggest ACS but many other  chronic and acute conditions are known to elevate hsTnI results.  Refer to the "Links" section  for chest pain algorithms and additional  guidance. Performed at [facility], [place] [address], [place], [place] [address]    DG Chest 2 View  Result Date: 10/16/2022 CLINICAL DATA:  Shortness of breath. EXAM: CHEST - 2 VIEW COMPARISON:  October 07, 2022. FINDINGS: The heart size and mediastinal contours are within normal limits. Status post coronary artery bypass graft. Bilateral pleural effusions are noted, left greater than right. Probable mild left basilar subsegmental atelectasis is noted. The visualized skeletal structures are unremarkable. IMPRESSION: Bilateral pleural effusions are noted, left greater than right. Probable mild left basilar subsegmental atelectasis. Electronically Signed   By: Marijo Conception M.D.   On: 10/16/2022 13:57    Pending Labs Unresulted Labs (From admission, onward)    None       Vitals/Pain Today's Vitals   10/16/22 1815 10/16/22 1825 10/16/22 1946 10/16/22 1948  BP: (Abnormal) 159/64   (Abnormal) 157/79  Pulse: 66   67  Resp: (Abnormal) 22   17  Temp:   98 F (36.7 C)   TempSrc:   Axillary   SpO2: 100%   95%  Weight:  59 kg    Height:  5\' 8"  (1.727 m)    PainSc:        Isolation Precautions No active isolations  Medications Medications  albuterol (VENTOLIN HFA) 108 (90 Base) MCG/ACT inhaler 2 puff (has no administration in time range)  furosemide (LASIX) injection 40 mg (40 mg Intravenous Given 10/16/22 1941)  potassium chloride SA (KLOR-CON M) CR tablet 40 mEq (40 mEq Oral Given 10/16/22 1936)  LORazepam (ATIVAN) injection 0.5 mg (0.5 mg Intravenous Given 10/16/22  1940)  acetaminophen (TYLENOL) tablet 650 mg (650 mg Oral Given 10/16/22 1936)  fentaNYL (SUBLIMAZE) injection 50 mcg (50 mcg Intravenous Given 10/16/22 1937)    Mobility walks     Focused Assessments Cardiac Assessment Handoff:    Lab Results  Component Value Date   TROPONINI < 0.02 06/08/2014   No results found for: "DDIMER" Does the Patient currently have chest pain? No   , Pulmonary Assessment Handoff:  Lung sounds:   O2 Device: Room Air      R Recommendations: See Admitting Provider Note  Report given to:   Additional Notes:

## 2022-10-16 NOTE — Consult Note (Signed)
Cardiology Consultation   Patient ID: Ruben [name] MRN: [id number]; DOB: [birth year]  Admit date: 10/16/2022 Date of Consult: 10/16/2022  PCP:  [name], MD   [facility] Prov[facility] Cardiologist:  None        Patient Profile:   Ruben [name] is a 77 y.o. male with a hx of coronary artery disease status post 3-vessel coronary artery bypass surgery (LIMA to LAD; SVG to OM1; and SVG to PDA) with left atrial appendage ligation on 10/05/22, reported paroxysmal atrial fibrillation, prior transient ischemic attack, and hypercholesteremia   who is being seen 10/16/2022 for the evaluation of dyspnea and orthopnea at the request of Dr. Myles Rosenthal.  History of Present Illness:   Ruben [name] presented yesterday evening due to complaints of 'suffocating' feeling when lying flat in bed.  His feeling of suffocation with lying in bed has been ongoing for the past several days.  He states the feeling has made it difficult for him to fall asleep but denies waking up due to shortness of breath.  He also denies any swelling in his legs, chest pain, palpitations, lightheadedness, or dizziness.  Ruben [name] states that he called Dr. Vivi Martens office, the cardiothoracic surgeon that performed his coronary artery bypass surgery two weeks prior to report his symptoms.  Patient states that he was prescribed Lasix that he started yesterday.  He eventually decided to come to our emergency room for further evaluation.  Patient states that he feels much better lying in bed at this time.    Work up in the emergency room revealed bilateral pleural effusions, right greater than right, via chest-xray.  ECG performed was unrevealing.  HS-troponin was in the equivocal range but substantially lower than earlier this month when patient presented with NSTEMI-ACS.  Patient was given IV furosemide 40mg  prior to his admission to the hospital.  Cardiothoracic surgery was contacted.  Echocardiogram was ordered  and pending.    Of note, patient was hospitalized from 10/02/22 to 10/11/22 for NSTEM-ACS for which he underwent 3-vessel coronary artery bypass surgery on 10/05/22.  Initial echocardiogram performed prior to his surgery was normal.  A repeat thransthoracic echocardiogram was not done post surgery.      Past Medical History:  Diagnosis Date   TIA (transient ischemic attack)     Past Surgical History:  Procedure Laterality Date   CLIPPING OF ATRIAL APPENDAGE  10/05/2022   Procedure: CLIPPING OF ATRIAL APPENDAGE;  Surgeon: Gaye Pollack, MD;  Location[facility];  Service: Open Heart Surgery;;   CORONARY ARTERY BYPASS GRAFT N/A 10/05/2022   Procedure: CORONARY ARTERY BYPASS GRAFTING (CABG) X THREE BYPASSES USING OPEN LEFT INTERNAL MAMMARY ARTERY AND ENDOSCOPIC RIGHT GREATER SAPHENOUS VEIN HARVEST; CLIPPING OF ATRIAL APPENDAGE WITH 40MM ATRICLIP;  Surgeon: Gaye Pollack, MD;  Location: [place];  Service: Open Heart Surgery;  Laterality: N/A;   LEFT HEART CATH AND CORONARY ANGIOGRAPHY N/A 10/02/2022   Procedure: LEFT HEART CATH AND CORONARY ANGIOGRAPHY;  Surgeon: Yolonda Kida, MD;  Location: [facility];  Service: Cardiovascular;  Laterality: N/A;   SHOULDER ARTHROSCOPY     TEE WITHOUT CARDIOVERSION N/A 10/05/2022   Procedure: TRANSESOPHAGEAL ECHOCARDIOGRAM;  Surgeon: Gaye Pollack, MD;  Location: [facility];  Service: Open Heart Surgery;  Laterality: N/A;     Home Medications:  Prior to Admission medications   Medication Sig Start Date End Date Taking? Authorizing Provider  amiodarone (PACERONE) 200 MG tablet Take 1 tablet (200 mg total)  by mouth 2 (two) times daily. For 7 days then reduce the dose to 1 tablet (200 mg) twice daily. 10/11/22   Gold, Wilder Glade, PA-C  apixaban (ELIQUIS) 5 MG TABS tablet Take 1 tablet (5 mg total) by mouth 2 (two) times daily. 10/11/22   John Giovanni, PA-C  aspirin EC 81 MG tablet Take 1 tablet (81 mg total) by mouth daily. Swallow whole. 10/11/22   John Giovanni, PA-C  cholecalciferol (VITAMIN D3) 25 MCG (1000 UNIT) tablet Take 1,000 Units by mouth daily.    [provider]  Fe Fum-Vit C-Vit B12-FA (TRIGELS-F FORTE) CAPS capsule Take 1 capsule by mouth daily after breakfast. 10/11/22 11/10/22  Jadene Pierini E, PA-C  metoprolol tartrate (LOPRESSOR) 25 MG tablet Take 1 tablet (25 mg total) by mouth 2 (two) times daily. 10/11/22   Gold, Wilder Glade, PA-C  Polyvinyl Alcohol-Povidone (REFRESH OP) Place 1 drop into both eyes as needed (dry eye).    [provider]  rosuvastatin (CRESTOR) 20 MG tablet Take 2 tablets (40 mg total) by mouth at bedtime. Patient not taking: Reported on 10/04/2022 10/02/22 10/02/23  Callwood, Karma Greaser D, MD  traMADol (ULTRAM) 50 MG tablet Take 1 tablet (50 mg total) by mouth every 6 (six) hours as needed for up to 7 days for moderate pain. 10/11/22 10/18/22  John Giovanni, PA-C  vitamin B-12 (CYANOCOBALAMIN) 1000 MCG tablet Take 1,000 mcg by mouth daily.    [provider]  vitamin C (ASCORBIC ACID) 250 MG tablet Take 250 mg by mouth daily.    [provider]    Inpatient Medications: Scheduled Meds:  [START ON 10/17/2022] furosemide  40 mg Intravenous Q12H   [START ON 10/17/2022] potassium chloride  20 mEq Oral Daily   Continuous Infusions:  PRN Meds: acetaminophen **OR** acetaminophen, albuterol, HYDROmorphone (DILAUDID) injection, levalbuterol, ondansetron **OR** ondansetron (ZOFRAN) IV  Allergies:    Allergies  Allergen Reactions   Codeine Nausea Only   Naproxen Sodium Other (See Comments)    Bleeding ulcers    Social History:   Social History   Socioeconomic History   Marital status: Married    Spouse name: Not on file   Number of children: Not on file   Years of education: Not on file   Highest education level: Not on file  Occupational History   Not on file  Tobacco Use   Smoking status: Never   Smokeless tobacco: Never  Substance and Sexual Activity   Alcohol use: Not Currently    Drug use: Never   Sexual activity: Yes  Other Topics Concern   Not on file  Social History Narrative   Not on file   Social Determinants of Health   Financial Resource Strain: Not on file  Food Insecurity: No Food Insecurity (10/16/2022)   Hunger Vital Sign    Worried About Running Out of Food in the Last Year: Never true    Ran Out of Food in the Last Year: Never true  Transportation Needs: No Transportation Needs (10/16/2022)   PRAPARE - Hydrologist (Medical): No    Lack of Transportation (Non-Medical): No  Physical Activity: Not on file  Stress: Not on file  Social Connections: Not on file  Intimate Partner Violence: Not At Risk (10/16/2022)   Humiliation, Afraid, Rape, and Kick questionnaire    Fear of Current or Ex-Partner: No    Emotionally Abused: No    Physically Abused: No    Sexually Abused:  No    Family History:   Father with coronary artery bypass surgery in his 86s.  Family History  Problem Relation Age of Onset   Colon cancer Mother    Heart disease Father      ROS:  Please see the history of present illness.   All other ROS reviewed and negative.     Physical Exam/Data:   Vitals:   10/16/22 1946 10/16/22 1948 10/16/22 2000 10/16/22 2136  BP:  (!) 157/79 (!) 160/134 132/65  Pulse:  67 71 76  Resp:  17 18 19   Temp: 98 F (36.7 C)   97.8 F (36.6 C)  TempSrc: Axillary   Oral  SpO2:  95% 97% 98%  Weight:    61.4 kg  Height:    5\' 8"  (1.727 m)    Intake/Output Summary (Last 24 hours) at 10/16/2022 2325 Last data filed at 10/16/2022 2317 Gross per 24 hour  Intake --  Output 1150 ml  Net -1150 ml      10/16/2022    9:36 PM 10/16/2022    6:25 PM 10/11/2022    6:10 AM  Last 3 Weights  Weight (lbs) 135 lb 5.8 oz 130 lb 1.1 oz 130 lb 4.7 oz  Weight (kg) 61.4 kg 59 kg 59.1 kg     Body mass index is 20.58 kg/m.  General:  Well nourished, well developed, in no acute distress HEENT: normal Neck: JVD <10cm above the  right atrium Vascular: No carotid bruits; Distal pulses 2+ bilaterally Cardiac:  Well healing sternal surgical incision.  normal S1, S2; RRR; no murmur/gallop/rubs Lungs:  Bilateral rales.  No wheezing or rhonchi  Abd: soft, nontender, no hepatomegaly  Ext: no edema Musculoskeletal:  No deformities, BUE and BLE strength normal and equal Skin: warm and dry  Neuro:  CNs 2-12 intact, no focal abnormalities noted Psych:  Normal affect   EKG:  The EKG was personally reviewed and demonstrates:  10/16/22 (13:04): NSR; non-specific T-wave changes (new non-specific T-wave changes from 10/06/22 ECG) Telemetry:  Telemetry was personally reviewed and demonstrates:  sinus rhythm  Relevant CV Studies: # Coronary angiography performed 10/02/22:   Ost LM to Mid LM lesion is 75% stenosed.   Ost RCA to Prox RCA lesion is 90% stenosed.   Dist LM to Ost LAD lesion is 100% stenosed.   Ost Cx to Dist Cx lesion is 50% stenosed.   Prox RCA to Mid RCA lesion is 50% stenosed.   The left ventricular systolic function is normal.   LV end diastolic pressure is normal.   The left ventricular ejection fraction is 50-55% by visual estimate.   There is no mitral valve regurgitation.  # Echocardiogram performed 10/02/22: IMPRESSIONS   1. Left ventricular ejection fraction, by estimation, is 60 to 65%. The  left ventricle has normal function. The left ventricle has no regional  wall motion abnormalities. Left ventricular diastolic parameters are  consistent with Grade I diastolic  dysfunction (impaired relaxation).   2. Right ventricular systolic function is normal. The right ventricular  size is normal. There is normal pulmonary artery systolic pressure.   3. The mitral valve is normal in structure. Trivial mitral valve  regurgitation.   4. The aortic valve is normal in structure. Aortic valve regurgitation is  not visualized.   Laboratory Data:  High Sensitivity Troponin:   Recent Labs  Lab 10/02/22 1043  10/02/22 1215 10/16/22 1446 10/16/22 1753 10/16/22 2134  TROPONINIHS 6,534* 6,270* 80* 99* 113*  Chemistry Recent Labs  Lab 10/10/22 0055 10/16/22 1446  NA 138 142  K 4.0 4.6  CL 107 107  CO2 22 20*  GLUCOSE 95 101*  BUN 18 13  CREATININE 1.15 1.26*  CALCIUM 8.2* 9.2  GFRNONAA >60 59*  ANIONGAP 9 15    Recent Labs  Lab 10/16/22 1446  PROT 6.6  ALBUMIN 3.2*  AST 43*  ALT 42  ALKPHOS 27*  BILITOT 1.1   Lipids No results for input(s): "CHOL", "TRIG", "HDL", "LABVLDL", "LDLCALC", "CHOLHDL" in the last 168 hours.  Hematology Recent Labs  Lab 10/10/22 0055 10/16/22 1446  WBC 5.9 10.1  RBC 2.39* 2.73*  HGB 7.9* 9.0*  HCT 23.2* 29.5*  MCV 97.1 108.1*  MCH 33.1 33.0  MCHC 34.1 30.5  RDW 21.1* 20.9*  PLT 138* 435*   Thyroid No results for input(s): "TSH", "FREET4" in the last 168 hours.  BNP Recent Labs  Lab 10/16/22 1446  BNP 256.2*    DDimer No results for input(s): "DDIMER" in the last 168 hours.   Radiology/Studies:  DG Chest 2 View  Result Date: 10/16/2022 CLINICAL DATA:  Shortness of breath. EXAM: CHEST - 2 VIEW COMPARISON:  October 07, 2022. FINDINGS: The heart size and mediastinal contours are within normal limits. Status post coronary artery bypass graft. Bilateral pleural effusions are noted, left greater than right. Probable mild left basilar subsegmental atelectasis is noted. The visualized skeletal structures are unremarkable. IMPRESSION: Bilateral pleural effusions are noted, left greater than right. Probable mild left basilar subsegmental atelectasis. Electronically Signed   By: Marijo Conception M.D.   On: 10/16/2022 13:57     Assessment and Plan:   Orthopnea: Patient with orthopnea without dyspnea on exertion likely secondary to bilateral pleural effusion.  Examination concerning for hemodynamic congestion with JVD >10cm above the right atrium and crackles on lung examination.  No significant cardiac examination findings such as murmur.   Patient appears hemodynamically stable.  He has been provided IV furosemide.  Echocardiogram ordered and pending.  Chest CT ordered and pending. --Agree with IV furosemide for the time being at 40mg , twice a day for now with transition to oral furosemide guided by examination findings.  Please check BMP twice daily.   --Echocardiogram will be assessed by the day cardiology team with further recommendations pending.    3.   Coronary artery disease: Multi-vessel coronary artery disease status post 3-vessel bypass surgery (anatomy described above).  On guideline directed medical therapy that includes apixaban and aspirin for now. --Cardiothoracic surgery can consider changing aspirin to a P2Y12 given recent NSTEMI-ACS for at least 6 months.  HS-troponin in the equivocal range without any substantial delta, and low concern for NSTEMI-ACS at this time (no symptoms). --Otherwise, continue home secondary prevention therapies.    Paroxysmal atrial fibrillation: Known paroxysmal atrial fibrillation documented prior to cardiac surgery.  Currently in sinus rhythm.  CHA2DS2 VASc score is 5, high risk for atrial fibrillation related thrombo-embolism.  Patient taking apixaban at home with amiodarone that was initiated post surgery for rhythm control strategy.  He is status post left atrial appendage ligation.  Anticoagulation is being held at this time, presumably for possible procedure such as thoracentesis if needed. --Please restart oral anticoagulation if no additional procedures will be pursued. --Continue home amiodarone.     Risk Assessment/Risk Scores:        New York Heart Association (NYHA) Functional Class NYHA Class II  CHA2DS2-VASc Score = 5   This indicates a 7.2% annual  risk of stroke. The patient's score is based upon: CHF History: 0 HTN History: 0 Diabetes History: 0 Stroke History: 2 Vascular Disease History: 1 Age Score: 2 Gender Score: 0         For questions or updates,  please contact [place] Please consult [email or web address] for contact info under    Signed, Jon Billings, MD  10/16/2022 11:25 PM

## 2022-10-16 NOTE — ED Provider Notes (Signed)
[facility] Provider Note   CSN: [id number] Arrival date & time: 10/16/22  1243     History {Add pertinent medical, surgical, social history, OB history to HPI:1} Chief Complaint  Patient presents with   Positional SOB    Ruben [name] is a 77 y.o. male who presents emergency department with chief complaint of severe orthopnea.  Patient was admitted for NSTEMI on 10/03/2022.  He underwent open heart surgery with CABG x 3. who presents emergency department with chief of  He has a history of chronic atrial fibrillation, thrombocytopenia and is also on Eliquis.  History is given by patient, his wife, and his daughter who is a Marine scientist by telephone.  Patient's wife reports that he has been doing well up until the past few days when he has had severe orthopnea.  He feels suffocated when laying flat.  He has been unable to sleep in a reclining position.  His daughter states that she noticed a little bit of a cough and wheeze a few days ago.  They reached out to his primary care physician who put him on a steroid pack which she is currently taking.  Patient's daughter also states that he used to be on Lasix but is no longer taking that status post CABG.  Patient has also been monitoring his pulse ox at home.  His wife states that she has been taking his pulse ox after ambulation and it has dropped down to the 70s multiple times especially when lying flat.  Patient states that he feels tired and irritable because he has been unable to sleep.  They did get a prescription for Ambien which she has not taken.  He has been trying trazodone which has not been helpful.  He denies any active chest pain, shortness of breath when sitting upright at about 90 degrees, or fevers  HPI     Home Medications Prior to Admission medications   Medication Sig Start Date End Date Taking? Authorizing Provider  amiodarone (PACERONE) 200 MG tablet Take 1 tablet (200 mg total) by  mouth 2 (two) times daily. For 7 days then reduce the dose to 1 tablet (200 mg) twice daily. 10/11/22   Gold, Wilder Glade, PA-C  apixaban (ELIQUIS) 5 MG TABS tablet Take 1 tablet (5 mg total) by mouth 2 (two) times daily. 10/11/22   John Giovanni, PA-C  aspirin EC 81 MG tablet Take 1 tablet (81 mg total) by mouth daily. Swallow whole. 10/11/22   John Giovanni, PA-C  cholecalciferol (VITAMIN D3) 25 MCG (1000 UNIT) tablet Take 1,000 Units by mouth daily.    [provider]  Fe Fum-Vit C-Vit B12-FA (TRIGELS-F FORTE) CAPS capsule Take 1 capsule by mouth daily after breakfast. 10/11/22 11/10/22  Jadene Pierini E, PA-C  metoprolol tartrate (LOPRESSOR) 25 MG tablet Take 1 tablet (25 mg total) by mouth 2 (two) times daily. 10/11/22   Gold, Wilder Glade, PA-C  Polyvinyl Alcohol-Povidone (REFRESH OP) Place 1 drop into both eyes as needed (dry eye).    [provider]  rosuvastatin (CRESTOR) 20 MG tablet Take 2 tablets (40 mg total) by mouth at bedtime. Patient not taking: Reported on 10/04/2022 10/02/22 10/02/23  Callwood, Karma Greaser D, MD  traMADol (ULTRAM) 50 MG tablet Take 1 tablet (50 mg total) by mouth every 6 (six) hours as needed for up to 7 days for moderate pain. 10/11/22 10/18/22  John Giovanni, PA-C  vitamin B-12 (CYANOCOBALAMIN) 1000 MCG tablet Take  1,000 mcg by mouth daily.    [provider]  vitamin C (ASCORBIC ACID) 250 MG tablet Take 250 mg by mouth daily.    [provider]      Allergies    Codeine and Naproxen sodium    Review of Systems   Review of Systems  Physical Exam Updated Vital Signs BP (!) 143/64 (BP Location: Right Arm)   Pulse 66   Temp (!) 97.4 F (36.3 C)   Resp 18   SpO2 100%  Physical Exam Vitals and nursing note reviewed.  Constitutional:      General: He is not in acute distress.    Appearance: He is well-developed. He is not diaphoretic.  HENT:     Head: Normocephalic and atraumatic.  Eyes:     General: No scleral icterus.     Conjunctiva/sclera: Conjunctivae normal.  Cardiovascular:     Rate and Rhythm: Normal rate and regular rhythm.     Heart sounds: Normal heart sounds.  Pulmonary:     Effort: Pulmonary effort is normal. No respiratory distress.     Breath sounds: Examination of the left-middle field reveals decreased breath sounds. Examination of the right-lower field reveals decreased breath sounds. Examination of the left-lower field reveals decreased breath sounds. Decreased breath sounds present.  Abdominal:     Palpations: Abdomen is soft.     Tenderness: There is no abdominal tenderness.  Musculoskeletal:     Cervical back: Normal range of motion and neck supple.  Skin:    General: Skin is warm and dry.     Comments: Well-healing surgical scars in the right leg, bruising bruising to the medial thigh, right leg is swollen just a little bit more than the left.  Well-healing surgical scars on the abdomen.  Neurological:     Mental Status: He is alert.  Psychiatric:        Behavior: Behavior normal.     ED Results / Procedures / Treatments   Labs (all labs ordered are listed, but only abnormal results are displayed) Labs Reviewed - No data to display  EKG None  Radiology DG Chest 2 View  Result Date: 10/16/2022 CLINICAL DATA:  Shortness of breath. EXAM: CHEST - 2 VIEW COMPARISON:  October 07, 2022. FINDINGS: The heart size and mediastinal contours are within normal limits. Status post coronary artery bypass graft. Bilateral pleural effusions are noted, left greater than right. Probable mild left basilar subsegmental atelectasis is noted. The visualized skeletal structures are unremarkable. IMPRESSION: Bilateral pleural effusions are noted, left greater than right. Probable mild left basilar subsegmental atelectasis. Electronically Signed   By: Marijo Conception M.D.   On: 10/16/2022 13:57    Procedures Procedures  {Document cardiac monitor, telemetry assessment procedure when  appropriate:1}  Medications Ordered in ED Medications  albuterol (VENTOLIN HFA) 108 (90 Base) MCG/ACT inhaler 2 puff (has no administration in time range)    ED Course/ Medical Decision Making/ A&P   {   Click here for ABCD2, HEART and other calculatorsREFRESH Note before signing :1}                          Medical Decision Making  ***  {Document critical care time when appropriate:1} {Document review of labs and clinical decision tools ie heart score, Chads2Vasc2 etc:1}  {Document your independent review of radiology images, and any outside records:1} {Document your discussion with family members, caretakers, and with consultants:1} {Document social determinants of health  affecting pt's care:1} {Document your decision making why or why not admission, treatments were needed:1} Final Clinical Impression(s) / ED Diagnoses Final diagnoses:  None    Rx / DC Orders ED Discharge Orders     None

## 2022-10-16 NOTE — Progress Notes (Signed)
Patient discussed with ER staff  They are considering a hospitalist admit given history of home desaturations to 70s, now resolved.   Stable vitals and labs in ER XR with mild-mod pericardial effusions  Plan for Echo to r/o pericardial effusion  Would also get a Duplex LE bilateral completed to ensure no DVT  Will be on 40 lasix daily along with potassium supplementation for the next week.   Probably could not get a thora as he is on eliquis.   Could go home potentially tomorrow.

## 2022-10-16 NOTE — ED Triage Notes (Deleted)
Pt came in via POV d/t waking up this morning & his bed was covered in blood & urine. Does have a indwelling foley catheter that is full while in triage, c/o penile burning. Pt states blood is coming from his anus & had his last BM 4.5-5 hrs ago & it was runny, has been taking laxatives. Denies fevers, endorses cold sweats, A/Ox4, 9/10 pain all over.

## 2022-10-16 NOTE — ED Triage Notes (Signed)
Pt came in via POV d/t not being able to sleep d/t difficulty breathing while laying down. Family reports his O2 was going down in the 70's while he lays flat. A/Ox4, denies pain, minimal exertional SOB & chest tightness. Pt had a bypass done 10 days ago & this had never happened prior.

## 2022-10-17 ENCOUNTER — Inpatient Hospital Stay (HOSPITAL_COMMUNITY): Payer: PPO

## 2022-10-17 DIAGNOSIS — R7989 Other specified abnormal findings of blood chemistry: Secondary | ICD-10-CM

## 2022-10-17 DIAGNOSIS — I5031 Acute diastolic (congestive) heart failure: Secondary | ICD-10-CM | POA: Diagnosis not present

## 2022-10-17 DIAGNOSIS — R0602 Shortness of breath: Secondary | ICD-10-CM | POA: Diagnosis not present

## 2022-10-17 DIAGNOSIS — I2510 Atherosclerotic heart disease of native coronary artery without angina pectoris: Secondary | ICD-10-CM

## 2022-10-17 DIAGNOSIS — I480 Paroxysmal atrial fibrillation: Secondary | ICD-10-CM

## 2022-10-17 DIAGNOSIS — R609 Edema, unspecified: Secondary | ICD-10-CM | POA: Diagnosis not present

## 2022-10-17 LAB — ECHOCARDIOGRAM COMPLETE
AR max vel: 1.8 cm2
AV Area VTI: 1.6 cm2
AV Area mean vel: 1.73 cm2
AV Mean grad: 4 mmHg
AV Peak grad: 7.1 mmHg
Ao pk vel: 1.33 m/s
Area-P 1/2: 3.72 cm2
Height: 68 in
Weight: 2165.8 oz

## 2022-10-17 LAB — COMPREHENSIVE METABOLIC PANEL
ALT: 40 U/L (ref 0–44)
AST: 32 U/L (ref 15–41)
Albumin: 3.1 g/dL — ABNORMAL LOW (ref 3.5–5.0)
Alkaline Phosphatase: 24 U/L — ABNORMAL LOW (ref 38–126)
Anion gap: 13 (ref 5–15)
BUN: 13 mg/dL (ref 8–23)
CO2: 25 mmol/L (ref 22–32)
Calcium: 9.1 mg/dL (ref 8.9–10.3)
Chloride: 102 mmol/L (ref 98–111)
Creatinine, Ser: 1.38 mg/dL — ABNORMAL HIGH (ref 0.61–1.24)
GFR, Estimated: 53 mL/min — ABNORMAL LOW (ref >60)
Glucose, Bld: 115 mg/dL — ABNORMAL HIGH (ref 70–99)
Potassium: 4 mmol/L (ref 3.5–5.1)
Sodium: 140 mmol/L (ref 135–145)
Total Bilirubin: 0.8 mg/dL (ref 0.3–1.2)
Total Protein: 6 g/dL — ABNORMAL LOW (ref 6.5–8.1)

## 2022-10-17 LAB — TROPONIN I (HIGH SENSITIVITY): Troponin I (High Sensitivity): 119 ng/L (ref <18)

## 2022-10-17 MED ORDER — METOPROLOL SUCCINATE ER 25 MG PO TB24
25.0000 mg | ORAL_TABLET | Freq: Every day | ORAL | Status: DC
  Administered 2022-10-17 – 2022-10-19 (×3): 25 mg via ORAL
  Filled 2022-10-17 (×3): qty 1

## 2022-10-17 MED ORDER — APIXABAN 5 MG PO TABS
5.0000 mg | ORAL_TABLET | Freq: Two times a day (BID) | ORAL | Status: DC
  Administered 2022-10-17: 5 mg via ORAL
  Filled 2022-10-17: qty 1

## 2022-10-17 MED ORDER — ROSUVASTATIN CALCIUM 20 MG PO TABS
20.0000 mg | ORAL_TABLET | Freq: Every day | ORAL | Status: DC
  Administered 2022-10-17 – 2022-10-19 (×3): 20 mg via ORAL
  Filled 2022-10-17 (×3): qty 1

## 2022-10-17 MED ORDER — ASPIRIN 81 MG PO TBEC
81.0000 mg | DELAYED_RELEASE_TABLET | Freq: Every day | ORAL | Status: DC
  Administered 2022-10-17 – 2022-10-19 (×3): 81 mg via ORAL
  Filled 2022-10-17 (×3): qty 1

## 2022-10-17 MED ORDER — AMIODARONE HCL 200 MG PO TABS
200.0000 mg | ORAL_TABLET | Freq: Every day | ORAL | Status: DC
  Administered 2022-10-17 – 2022-10-19 (×3): 200 mg via ORAL
  Filled 2022-10-17 (×3): qty 1

## 2022-10-17 NOTE — Plan of Care (Signed)
  Problem: Education: Goal: Will demonstrate proper wound care and an understanding of methods to prevent future damage Outcome: Progressing Goal: Knowledge of disease or condition will improve Outcome: Progressing Goal: Knowledge of the prescribed therapeutic regimen will improve Outcome: Progressing Goal: Individualized Educational Video(s) Outcome: Progressing   Problem: Activity: Goal: Risk for activity intolerance will decrease Outcome: Progressing   Problem: Cardiac: Goal: Will achieve and/or maintain hemodynamic stability Outcome: Progressing   Problem: Clinical Measurements: Goal: Postoperative complications will be avoided or minimized Outcome: Progressing   Problem: Respiratory: Goal: Respiratory status will improve Outcome: Progressing   Problem: Skin Integrity: Goal: Wound healing without signs and symptoms of infection Outcome: Progressing Goal: Risk for impaired skin integrity will decrease Outcome: Progressing   Problem: Urinary Elimination: Goal: Ability to achieve and maintain adequate renal perfusion and functioning will improve Outcome: Progressing   Problem: Education: Goal: Understanding of cardiac disease, CV risk reduction, and recovery process will improve Outcome: Progressing Goal: Individualized Educational Video(s) Outcome: Progressing   Problem: Activity: Goal: Ability to tolerate increased activity will improve Outcome: Progressing   Problem: Cardiac: Goal: Ability to achieve and maintain adequate cardiovascular perfusion will improve Outcome: Progressing   Problem: Health Behavior/Discharge Planning: Goal: Ability to safely manage health-related needs after discharge will improve Outcome: Progressing   Problem: Education: Goal: Understanding of CV disease, CV risk reduction, and recovery process will improve Outcome: Progressing Goal: Individualized Educational Video(s) Outcome: Progressing   Problem: Activity: Goal: Ability  to return to baseline activity level will improve Outcome: Progressing   Problem: Cardiovascular: Goal: Ability to achieve and maintain adequate cardiovascular perfusion will improve Outcome: Progressing Goal: Vascular access site(s) Level 0-1 will be maintained Outcome: Progressing   Problem: Health Behavior/Discharge Planning: Goal: Ability to safely manage health-related needs after discharge will improve Outcome: Progressing   Problem: Education: Goal: Ability to demonstrate management of disease process will improve Outcome: Progressing Goal: Ability to verbalize understanding of medication therapies will improve Outcome: Progressing Goal: Individualized Educational Video(s) Outcome: Progressing   Problem: Activity: Goal: Capacity to carry out activities will improve Outcome: Progressing   Problem: Cardiac: Goal: Ability to achieve and maintain adequate cardiopulmonary perfusion will improve Outcome: Progressing   Problem: Education: Goal: Knowledge of General Education information will improve Description: Including pain rating scale, medication(s)/side effects and non-pharmacologic comfort measures Outcome: Progressing   Problem: Health Behavior/Discharge Planning: Goal: Ability to manage health-related needs will improve Outcome: Progressing   Problem: Clinical Measurements: Goal: Ability to maintain clinical measurements within normal limits will improve Outcome: Progressing Goal: Will remain free from infection Outcome: Progressing Goal: Diagnostic test results will improve Outcome: Progressing Goal: Respiratory complications will improve Outcome: Progressing Goal: Cardiovascular complication will be avoided Outcome: Progressing   Problem: Activity: Goal: Risk for activity intolerance will decrease Outcome: Progressing   Problem: Nutrition: Goal: Adequate nutrition will be maintained Outcome: Progressing   Problem: Coping: Goal: Level of anxiety  will decrease Outcome: Progressing   Problem: Elimination: Goal: Will not experience complications related to bowel motility Outcome: Progressing Goal: Will not experience complications related to urinary retention Outcome: Progressing   Problem: Pain Managment: Goal: General experience of comfort will improve Outcome: Progressing   Problem: Safety: Goal: Ability to remain free from injury will improve Outcome: Progressing   Problem: Skin Integrity: Goal: Risk for impaired skin integrity will decrease Outcome: Progressing

## 2022-10-17 NOTE — Progress Notes (Signed)
[address] FallsSuite [address]       ,[place] [id number]             [phone]         Subjective: Resting in bed with family at the bedside.  Shortness of breath currently.  Family reports she had the best night sleep last night since surgery.  Objective: Vital signs in last 24 hours: Temp:  [97.4 F (36.3 C)-98.8 F (37.1 C)] 98.3 F (36.8 C) (03/30 0807) Pulse Rate:  [59-85] 85 (03/30 0807) Cardiac Rhythm: Normal sinus rhythm (03/30 0700) Resp:  [15-27] 20 (03/30 0807) BP: (132-160)/(54-134) 136/54 (03/30 0807) SpO2:  [94 %-100 %] 96 % (03/30 0807) Weight:  [59 kg-61.4 kg] 61.4 kg (03/30 0100)      Intake/Output from previous day: 03/29 0701 - 03/30 0700 In: 240 [P.O.:240] Out: 1600 [Urine:1600] Intake/Output this shift: Total I/O In: 240 [P.O.:240] Out: 1960 [Urine:1960]  General appearance: alert, cooperative, and no distress Neurologic: intact Heart: Regular rate and rhythm, monitor showing normal sinus rhythm Lungs: Normal work of breathing at rest.  Breath sounds clear, diminished Abdomen: Soft, no tenderness.  Chest tube exit sites are well-healed with silk sutures in place. Extremities: No peripheral edema.  The right lower extremity EVH incision is healing with no sign of complication. Wound: The sternum is stable.  The incision is well-approximated and healing.  Lab Results: Recent Labs    10/16/22 1446 10/16/22 2330  WBC 10.1 10.0  HGB 9.0* 8.9*  HCT 29.5* 27.9*  PLT 435* 414*   BMET:  Recent Labs    10/16/22 1446 10/16/22 2330  NA 142 140  K 4.6 4.0  CL 107 102  CO2 20* 25  GLUCOSE 101* 115*  BUN 13 13  CREATININE 1.26* 1.38*  CALCIUM 9.2 9.1    PT/INR: No results for input(s): "LABPROT", "INR" in the last 72 hours. ABG    Component Value Date/Time   PHART 7.392 10/05/2022 2023   HCO3 19.1 (L) 10/05/2022 2023   TCO2 20 (L) 10/05/2022 2023   ACIDBASEDEF 5.0 (H) 10/05/2022 2023   O2SAT 100 10/05/2022 2023   CBG (last 3)   No results for input(s): "GLUCAP" in the last 72 hours.  Assessment/Plan:   Mr. Calderon status post CABG x 3 and application of a left atrial clip on 10/05/2022 by Dr. Cyndia Bent after presenting with non-ST elevation myocardial infarction and history of atrial fibrillation.  He had preserved biventricular function.  He was discharged on postop day 6 after an uneventful postoperative course.  He presented to the ED last evening with complaint of orthopnea and oxygen desaturation after minimal activity.  Admission chest x-ray showed a small right pleural effusion and moderate to large left pleural effusion.  Further workup has included an echocardiogram this morning showing left ventricular ejection fraction of 55 to 60%, normal right ventricular function, normal valvular function, and no significant pericardial effusion.  Lower extremity venous duplex scan shows no evidence of DVT.  He received IV Lasix following admission with good response.  He does not appear to be volume overloaded on exam currently.  Suspect the left pleural effusion is primarily responsible for his symptoms.  Recommend left thoracentesis.  I will consult the interventional radiology team for ultrasound-guided thoracentesis whenever their team feels it is safe to proceed.  His last dose of Eliquis was yesterday morning. We appreciate the assistance of the hospitalist team and cardiologist and caring for Mr. Lovena Le. We will continue  to follow.   LOS: 1 day    Antony Odea, PA-C [phone] 10/17/2022

## 2022-10-17 NOTE — Progress Notes (Signed)
VASCULAR LAB    Bilateral lower extremity venous duplex has been performed.  See CV proc for preliminary results.   Rexford Prevo, RVT 10/17/2022, 9:38 AM

## 2022-10-17 NOTE — Progress Notes (Signed)
Rounding Note    Patient Name: Ruben [name] Date of Encounter: 10/17/2022  [place] Cardiologist: None   Subjective   Feels better this morning. Has continued orthopnea and DOE  Inpatient Medications    Scheduled Meds:  furosemide  40 mg Intravenous Q12H   potassium chloride  20 mEq Oral Daily   Continuous Infusions:  PRN Meds: acetaminophen **OR** acetaminophen, albuterol, HYDROmorphone (DILAUDID) injection, levalbuterol, ondansetron **OR** ondansetron (ZOFRAN) IV   Vital Signs    Vitals:   10/16/22 2136 10/17/22 0100 10/17/22 0243 10/17/22 0807  BP: 132/65  (!) 148/67 (!) 136/54  Pulse: 76  80 85  Resp: 19  17 20   Temp: 97.8 F (36.6 C)  98.8 F (37.1 C) 98.3 F (36.8 C)  TempSrc: Oral  Oral Oral  SpO2: 98%  94% 96%  Weight: 61.4 kg 61.4 kg    Height: 5\' 8"  (1.727 m)       Intake/Output Summary (Last 24 hours) at 10/17/2022 0810 Last data filed at 10/17/2022 0758 Gross per 24 hour  Intake 480 ml  Output 2100 ml  Net -1620 ml      10/17/2022    1:00 AM 10/16/2022    9:36 PM 10/16/2022    6:25 PM  Last 3 Weights  Weight (lbs) 135 lb 5.8 oz 135 lb 5.8 oz 130 lb 1.1 oz  Weight (kg) 61.4 kg 61.4 kg 59 kg      Telemetry    NSR - Personally Reviewed  ECG    No new tracing - Personally Reviewed  Physical Exam   GEN: No acute distress.   Neck: No JVD Cardiac: RRR, no murmurs, rubs, or gallops.  Respiratory: Absent breath sounds left lung base. Diminished at right base GI: Soft, nontender, non-distended  MS: No edema; No deformity. Neuro:  Nonfocal  Psych: Normal affect   Labs    High Sensitivity Troponin:   Recent Labs  Lab 10/02/22 1215 10/16/22 1446 10/16/22 1753 10/16/22 2134 10/16/22 2330  TROPONINIHS 6,270* 80* 99* 113* 119*     Chemistry Recent Labs  Lab 10/16/22 1446 10/16/22 2330  NA 142 140  K 4.6 4.0  CL 107 102  CO2 20* 25  GLUCOSE 101* 115*  BUN 13 13  CREATININE 1.26* 1.38*  CALCIUM 9.2 9.1   PROT 6.6 6.0*  ALBUMIN 3.2* 3.1*  AST 43* 32  ALT 42 40  ALKPHOS 27* 24*  BILITOT 1.1 0.8  GFRNONAA 59* 53*  ANIONGAP 15 13    Lipids No results for input(s): "CHOL", "TRIG", "HDL", "LABVLDL", "LDLCALC", "CHOLHDL" in the last 168 hours.  Hematology Recent Labs  Lab 10/16/22 1446 10/16/22 2330  WBC 10.1 10.0  RBC 2.73* 2.71*  HGB 9.0* 8.9*  HCT 29.5* 27.9*  MCV 108.1* 103.0*  MCH 33.0 32.8  MCHC 30.5 31.9  RDW 20.9* 20.9*  PLT 435* 414*   Thyroid No results for input(s): "TSH", "FREET4" in the last 168 hours.  BNP Recent Labs  Lab 10/16/22 1446  BNP 256.2*    DDimer No results for input(s): "DDIMER" in the last 168 hours.   Radiology    DG Chest 2 View  Result Date: 10/16/2022 CLINICAL DATA:  Shortness of breath. EXAM: CHEST - 2 VIEW COMPARISON:  October 07, 2022. FINDINGS: The heart size and mediastinal contours are within normal limits. Status post coronary artery bypass graft. Bilateral pleural effusions are noted, left greater than right. Probable mild left basilar subsegmental atelectasis is noted. The visualized skeletal  structures are unremarkable. IMPRESSION: Bilateral pleural effusions are noted, left greater than right. Probable mild left basilar subsegmental atelectasis. Electronically Signed   By: Marijo Conception M.D.   On: 10/16/2022 13:57    Cardiac Studies   TTE 10/02/22: IMPRESSIONS     1. Left ventricular ejection fraction, by estimation, is 60 to 65%. The  left ventricle has normal function. The left ventricle has no regional  wall motion abnormalities. Left ventricular diastolic parameters are  consistent with Grade I diastolic  dysfunction (impaired relaxation).   2. Right ventricular systolic function is normal. The right ventricular  size is normal. There is normal pulmonary artery systolic pressure.   3. The mitral valve is normal in structure. Trivial mitral valve  regurgitation.   4. The aortic valve is normal in structure. Aortic valve  regurgitation is not visualized     Ost LM to Mid LM lesion is 75% stenosed.   Ost RCA to Prox RCA lesion is 90% stenosed.   Dist LM to Ost LAD lesion is 100% stenosed.   Ost Cx to Dist Cx lesion is 50% stenosed.   Prox RCA to Mid RCA lesion is 50% stenosed.   The left ventricular systolic function is normal.   LV end diastolic pressure is normal.   The left ventricular ejection fraction is 50-55% by visual estimate.   There is no mitral valve regurgitation.   Conclusion Status post non-STEMI presentation   Left heart cath right radial approach   Left ventriculogram showed preserved left ventricular function EF of at least 55%   Coronaries Left main large ostial 75 hazy calcified lesion LAD 100% occluded ostially TIMI I flow IRA Circumflex medium to small diffuse 50% RCA large 90% proximal 50% mid TIMI-3 flow Right dominant system Faint collaterals   Intervention deferred not indicated Recommend coronary bypass surgery at a tertiary care center  Patient Profile     77 y.o. male  with a hx of CAD s/p 3-vessel CABG (LIMA to LAD; SVG to OM1; and SVG to PDA) with left atrial appendage ligation on 10/05/22, paroxysmal atrial fibrillation, prior TIA, and HLD who presented with worsening SOB found to have acute on chronic diastolic HF exacerbation for which Cardiology was consulted.  Assessment & Plan   #Left Sided Pleural Effusion: #Acute on Chronic Diastolic HF Exacerbation: -TTE pre-CABG with EF 60-65%, G1DD, normal RV, trivial MR -Repeat TTE on admission with LVEF 55-60%, normal RV, no significant valve disease, RAP 45mmHg, normal E/e' -Presented with worsening SOB and orthopnea found to have bilateral pleural effusions and mildly elevated BNP 256 -Received IV lasix with improvement and normal filling pressures on today's echo -Agree with left sided thoracentesis -Will hold further IV diuretics today given mild bump in Cr and normal filling pressures on TTE  #CAD s/p  CABG: -S/p 3v CABG on 10/05/22 with LIMA to LAD; SVG to OM1; and SVG to PDA -Trop minimally elevated likely due to demand in the setting of acute on chronic diastolic HF exacerbation as above -Resume home ASA 81mg  daily -Resume home crestor 20mg  daily  #Paroxysmal Afib: -Maintaining NSR -Holding AC for now given plans for possible thoracentesis -Resume home amiodarone 200mg  daily -Change metop tartrate to metop succinate 25mg  XL daily  #HLD: -Resume home crestor 20mg  daily      For questions or updates, please contact [place] Please consult [email or web address] for contact info under        Signed, Freada Bergeron, MD  10/17/2022, 8:10 AM

## 2022-10-17 NOTE — Progress Notes (Signed)
Echocardiogram 2D Echocardiogram has been performed.  Ruben [name] 10/17/2022, 9:32 AM

## 2022-10-17 NOTE — Progress Notes (Addendum)
PROGRESS NOTE  [name] [id number] DOB: [birth year] DOA: 10/16/2022 PCP: [name], MD  HPI/Recap of past 24 hours: Ruben [name] is a 77 y.o. male with medical history significant of  TIA, atrial fibrillation, peptic ulcer disease, hyperlipidemia recent admission 3/16-3/24 for diagnosis of NSTEMI for which he underwent 3vCABG  as well as application of left atrial appendage clip on 10/05/22 by Dr Cyndia Bent. Presents to ED with complaint of sob, orthopnea  and chest discomfort with movement.  Cardiology was consulted.  Subsequently was seen by cardiothoracic surgery.  10/17/2022: The patient was seen and examined at his bedside with a family member present in the room.  He has no new complaints.  His orthopnea is improved.    Assessment/Plan: Principal Problem:   Orthopnea  Post CABG b/l Pleural Effusion with Recumbent Hypoxemia Left greater than right Left thoracentesis planned, last dose of Eliquis was yesterday. Home Eliquis currently on hold due to planned left thoracentesis.  IR following for thoracentesis. -concern for CHF/ as well as Pericardial effusion  -CT thorax for further evaluation as CABG 10 days ago  Cardiology and cardiothoracic surgery are following. -Per CT surgery trial of lasix iv  with quick transition to oral  -supplemental O2 as needed.  Elevated troponin No evidence of acute ischemia on twelve-lead EKG Follow 2D echo Rest of management per cardiology   CAD s/p NSTEMI s/p 3V CABG 10/05/22 No chest pain reported. Continue ASA, metoprolol , crestor   Paroxysmal atrial fibrillation  -s/p LAA clip - continue on amiodarone started post op cabg History of afib prior to CABG Resume home Eliquis when okay with cardiothoracic surgery   TIA Continue home secondary prophylaxis.   Peptic ulcer disease Continue home PPI   Hyperlipidemia  Continue home statin    DVT prophylaxis: on Eliquis Code Status: full/ as discussed per patient  wishes in event of cardiac arrest   Family Communication: Disposition Plan: patient  expected to be admitted greater than 2 midnights  Consults called: cardiology  Status is: Inpatient The patient requires at least 2 midnights for further evaluation and treatment of present condition.    Objective: Vitals:   10/17/22 0100 10/17/22 0243 10/17/22 0807 10/17/22 1247  BP:  (!) 148/67 (!) 136/54 (!) 103/91  Pulse:  80 85 72  Resp:  17 20 20   Temp:  98.8 F (37.1 C) 98.3 F (36.8 C) 97.9 F (36.6 C)  TempSrc:  Oral Oral Oral  SpO2:  94% 96% 98%  Weight: 61.4 kg     Height:        Intake/Output Summary (Last 24 hours) at 10/17/2022 1438 Last data filed at 10/17/2022 1100 Gross per 24 hour  Intake 720 ml  Output 3560 ml  Net -2840 ml   Filed Weights   10/16/22 1825 10/16/22 2136 10/17/22 0100  Weight: 59 kg 61.4 kg 61.4 kg    Exam:  General: 77 y.o. year-old male well developed well nourished in no acute distress.  Alert and oriented x3. Cardiovascular: Regular rate and rhythm with no rubs or gallops.  No thyromegaly or JVD noted.   Respiratory: Clear to auscultation with no wheezes or rales. Good inspiratory effort. Abdomen: Soft nontender nondistended with normal bowel sounds x4 quadrants. Musculoskeletal: No lower extremity edema. 2/4 pulses in all 4 extremities. Skin: No ulcerative lesions noted or rashes, Psychiatry: Mood is appropriate for condition and setting   Data Reviewed: CBC: Recent Labs  Lab 10/16/22 1446 10/16/22 2330  WBC 10.1 10.0  NEUTROABS 8.3*  --   HGB 9.0* 8.9*  HCT 29.5* 27.9*  MCV 108.1* 103.0*  PLT 435* [id number]*   Basic Metabolic Panel: Recent Labs  Lab 10/16/22 1446 10/16/22 2330  NA 142 140  K 4.6 4.0  CL 107 102  CO2 20* 25  GLUCOSE 101* 115*  BUN 13 13  CREATININE 1.26* 1.38*  CALCIUM 9.2 9.1   GFR: Estimated Creatinine Clearance: 40.2 mL/min (A) (by C-G formula based on SCr of 1.38 mg/dL (H)). Liver Function Tests: Recent  Labs  Lab 10/16/22 1446 10/16/22 2330  AST 43* 32  ALT 42 40  ALKPHOS 27* 24*  BILITOT 1.1 0.8  PROT 6.6 6.0*  ALBUMIN 3.2* 3.1*   No results for input(s): "LIPASE", "AMYLASE" in the last 168 hours. No results for input(s): "AMMONIA" in the last 168 hours. Coagulation Profile: No results for input(s): "INR", "PROTIME" in the last 168 hours. Cardiac Enzymes: No results for input(s): "CKTOTAL", "CKMB", "CKMBINDEX", "TROPONINI" in the last 168 hours. BNP (last 3 results) No results for input(s): "PROBNP" in the last 8760 hours. HbA1C: No results for input(s): "HGBA1C" in the last 72 hours. CBG: No results for input(s): "GLUCAP" in the last 168 hours. Lipid Profile: No results for input(s): "CHOL", "HDL", "LDLCALC", "TRIG", "CHOLHDL", "LDLDIRECT" in the last 72 hours. Thyroid Function Tests: No results for input(s): "TSH", "T4TOTAL", "FREET4", "T3FREE", "THYROIDAB" in the last 72 hours. Anemia Panel: No results for input(s): "VITAMINB12", "FOLATE", "FERRITIN", "TIBC", "IRON", "RETICCTPCT" in the last 72 hours. Urine analysis:    Component Value Date/Time   COLORURINE YELLOW 10/04/2022 2127   APPEARANCEUR CLEAR 10/04/2022 2127   LABSPEC 1.010 10/04/2022 2127   PHURINE 7.0 10/04/2022 2127   GLUCOSEU NEGATIVE 10/04/2022 2127   HGBUR SMALL (A) 10/04/2022 2127   BILIRUBINUR NEGATIVE 10/04/2022 2127   KETONESUR NEGATIVE 10/04/2022 2127   PROTEINUR NEGATIVE 10/04/2022 2127   NITRITE NEGATIVE 10/04/2022 2127   LEUKOCYTESUR NEGATIVE 10/04/2022 2127   Sepsis Labs: @LABRCNTIP (procalcitonin:4,lacticidven:4)  )No results found for this or any previous visit (from the past 240 hour(s)).    Studies: VAS [place] LOWER EXTREMITY VENOUS (DVT)  Result Date: 10/17/2022  Lower Venous DVT Study Patient Name:  Ruben [name]  Date of Exam:   10/17/2022 Medical Rec #: [id number]         Accession #:    [id number] Date of Birth: 06/04/1946        Patient Gender: M Patient Age:   63 years Exam  Location:  [facility] Procedure:      VAS [place] LOWER EXTREMITY VENOUS (DVT) Referring Phys: Myles Rosenthal --------------------------------------------------------------------------------  Indications: Edema, SOB, and elevated troponin and BNP, recent CABG.  Comparison Study: No prior study on file Performing Technologist: Sharion Dove RVS  Examination Guidelines: A complete evaluation includes B-mode imaging, spectral Doppler, color Doppler, and power Doppler as needed of all accessible portions of each vessel. Bilateral testing is considered an integral part of a complete examination. Limited examinations for reoccurring indications may be performed as noted. The reflux portion of the exam is performed with the patient in reverse Trendelenburg.  +--------+---------------+---------+-----------+----------------+-------------+ RIGHT   CompressibilityPhasicitySpontaneityProperties      Thrombus  Aging         +--------+---------------+---------+-----------+----------------+-------------+ CFV     Full                               pulsatile                                                                waveforms                     +--------+---------------+---------+-----------+----------------+-------------+ SFJ     Full                                                             +--------+---------------+---------+-----------+----------------+-------------+ FV Prox Full                                                             +--------+---------------+---------+-----------+----------------+-------------+ FV Mid  Full                               pulsatile                                                                waveforms                     +--------+---------------+---------+-----------+----------------+-------------+ FV      Full                                                              Distal                                                                   +--------+---------------+---------+-----------+----------------+-------------+ PFV     Full                                                             +--------+---------------+---------+-----------+----------------+-------------+ POP     Full  pulsatile                                                                waveforms                     +--------+---------------+---------+-----------+----------------+-------------+ PTV     Full                                                             +--------+---------------+---------+-----------+----------------+-------------+ PERO    Full                                                             +--------+---------------+---------+-----------+----------------+-------------+   +--------+---------------+---------+-----------+----------------+-------------+ LEFT    CompressibilityPhasicitySpontaneityProperties      Thrombus                                                                 Aging         +--------+---------------+---------+-----------+----------------+-------------+ CFV     Full                               pulsatile                                                                waveforms                     +--------+---------------+---------+-----------+----------------+-------------+ SFJ     Full                                                             +--------+---------------+---------+-----------+----------------+-------------+ FV Prox Full                                                             +--------+---------------+---------+-----------+----------------+-------------+ FV Mid  Full                               pulsatile  waveforms                      +--------+---------------+---------+-----------+----------------+-------------+ FV      Full                                                             Distal                                                                   +--------+---------------+---------+-----------+----------------+-------------+ PFV     Full                                                             +--------+---------------+---------+-----------+----------------+-------------+ POP     Full                               pulsatile                                                                waveforms                     +--------+---------------+---------+-----------+----------------+-------------+ PTV     Full                                                             +--------+---------------+---------+-----------+----------------+-------------+ PERO    Full                                                             +--------+---------------+---------+-----------+----------------+-------------+     Summary: BILATERAL: - No evidence of deep vein thrombosis seen in the lower extremities, bilaterally. -No evidence of popliteal cyst, bilaterally. RIGHT: Pulsatile waveforms consistent with fluid overload  LEFT: Pulsatile waveforms consistent with fluid overload.  *See table(s) above for measurements and observations. Electronically signed by Harold Barban MD on 10/17/2022 at 1:30:38 PM.    Final    CT CHEST WO CONTRAST  Result Date: 10/17/2022 CLINICAL DATA:  89-year-old male with suspected pericardial disease. EXAM: CT CHEST WITHOUT CONTRAST TECHNIQUE: Multidetector CT imaging of the chest was performed following the standard protocol without IV contrast. RADIATION DOSE REDUCTION: This exam was performed according to the departmental dose-optimization program which includes automated  exposure control, adjustment of the mA and/or kV according to patient size and/or use of iterative  reconstruction technique. COMPARISON:  Chest CT 10/03/2022. FINDINGS: Cardiovascular: Heart size is normal. Trace amount of pericardial fluid and/or thickening, unlikely to be of any hemodynamic significance at this time. No pericardial calcification. There is aortic atherosclerosis, as well as atherosclerosis of the great vessels of the mediastinum and the coronary arteries, including calcified atherosclerotic plaque in the left main, left anterior descending, left circumflex and right coronary arteries. Status post median sternotomy for CABG including LIMA to the LAD. Status post left atrial appendage ligation with ligation clip noted. Mediastinum/Nodes: No pathologically enlarged mediastinal or hilar lymph nodes. Esophagus is unremarkable in appearance. No axillary lymphadenopathy. Lungs/Pleura: Moderate right and large left pleural effusions with extensive passive subsegmental atelectasis in the dependent portions of the lungs bilaterally. No acute consolidative airspace disease. No definite suspicious appearing pulmonary nodules or masses are noted. Upper Abdomen: 9 mm calcified gallstone in the gallbladder. 1.6 cm low-attenuation lesion in segment 7 of the liver incompletely characterized on today's noncontrast CT examination, but similar to the prior study and statistically likely a cyst (no imaging follow-up recommended). 7 mm nonobstructive calculus in the upper pole collecting system of the left kidney. Cortical calcification in the upper pole of the right kidney. Aortic atherosclerosis. Musculoskeletal: Median sternotomy wires. There are no aggressive appearing lytic or blastic lesions noted in the visualized portions of the skeleton. IMPRESSION: 1. Postoperative changes of recent CABG with trace amount of pericardial fluid and/or thickening, unlikely to be of any hemodynamic significance at this time. 2. Moderate right and large left pleural effusions lying dependently with extensive passive atelectasis  in the dependent portions of the lungs bilaterally. 3. Aortic atherosclerosis, in addition to left main and three-vessel coronary artery disease. Status post median sternotomy for CABG including LIMA to the LAD. Patient is also status post left atrial appendage ligation. 4. Additional incidental findings, as above. Aortic Atherosclerosis (ICD10-I70.0). Electronically Signed   By: Vinnie Langton M.D.   On: 10/17/2022 11:04   ECHOCARDIOGRAM COMPLETE  Result Date: 10/17/2022    ECHOCARDIOGRAM REPORT   Patient Name:   JAMARRIE ANGELLO Date of Exam: 10/17/2022 Medical Rec #:  [id number]        Height:       68.0 in Accession #:    [id number]       Weight:       135.4 lb Date of Birth:  Oct 09, 1945       BSA:          1.731 m Patient Age:    84 years         BP:           148/67 mmHg Patient Gender: M                HR:           75 bpm. Exam Location:  Inpatient Procedure: 2D Echo, Cardiac Doppler and Color Doppler Indications:    CHF-Acute Diastolic [id number]  History:        Patient has prior history of Echocardiogram examinations, most                 recent 10/02/2022. Previous Myocardial Infarction, Prior CABG,                 TIA, Arrythmias:Atrial Fibrillation; Risk Factors:Dyslipidemia.  Sonographer:    Ronny Flurry Referring Phys: [id number] SARA-MAIZ A THOMAS  Sonographer Comments: No parasternal  window and suboptimal apical window. IMPRESSIONS  1. Left ventricular ejection fraction, by estimation, is 55 to 60%. The left ventricle has normal function. Left ventricular endocardial border not optimally defined to evaluate regional wall motion. Left ventricular diastolic parameters are indeterminate.  2. Right ventricular systolic function is normal. The right ventricular size is normal. Tricuspid regurgitation signal is inadequate for assessing PA pressure.  3. Large pleural effusion in the left lateral region.  4. The mitral valve is normal in structure. No evidence of mitral valve regurgitation. No evidence of  mitral stenosis.  5. The aortic valve was not well visualized. Aortic valve regurgitation is not visualized. No aortic stenosis is present.  6. The inferior vena cava is normal in size with greater than 50% respiratory variability, suggesting right atrial pressure of 3 mmHg. FINDINGS  Left Ventricle: Left ventricular ejection fraction, by estimation, is 55 to 60%. The left ventricle has normal function. Left ventricular endocardial border not optimally defined to evaluate regional wall motion. The left ventricular internal cavity size was normal in size. There is no left ventricular hypertrophy. Left ventricular diastolic parameters are indeterminate. Right Ventricle: The right ventricular size is normal. Right vetricular wall thickness was not well visualized. Right ventricular systolic function is normal. Tricuspid regurgitation signal is inadequate for assessing PA pressure. Left Atrium: Left atrial size was normal in size. Right Atrium: Right atrial size was normal in size. Pericardium: There is no evidence of pericardial effusion. Mitral Valve: The mitral valve is normal in structure. No evidence of mitral valve regurgitation. No evidence of mitral valve stenosis. Tricuspid Valve: The tricuspid valve is normal in structure. Tricuspid valve regurgitation is trivial. No evidence of tricuspid stenosis. Aortic Valve: The aortic valve was not well visualized. Aortic valve regurgitation is not visualized. No aortic stenosis is present. Aortic valve mean gradient measures 4.0 mmHg. Aortic valve peak gradient measures 7.1 mmHg. Aortic valve area, by VTI measures 1.60 cm. Pulmonic Valve: The pulmonic valve was not well visualized. Pulmonic valve regurgitation is not visualized. No evidence of pulmonic stenosis. Aorta: The aortic root was not well visualized. Venous: The inferior vena cava is normal in size with greater than 50% respiratory variability, suggesting right atrial pressure of 3 mmHg. IAS/Shunts: No atrial  level shunt detected by color flow Doppler. Additional Comments: There is a large pleural effusion in the left lateral region.  LEFT VENTRICLE PLAX 2D LVOT diam:     1.78 cm   Diastology LV SV:         38        LV e' medial:    6.31 cm/s LV SV Index:   22        LV E/e' medial:  13.2 LVOT Area:     2.49 cm  LV e' lateral:   7.07 cm/s                          LV E/e' lateral: 11.8  RIGHT VENTRICLE             IVC RV S prime:     13.20 cm/s  IVC diam: 1.70 cm AORTIC VALVE AV Area (Vmax):    1.80 cm AV Area (Vmean):   1.73 cm AV Area (VTI):     1.60 cm AV Vmax:           133.00 cm/s AV Vmean:          91.600 cm/s AV VTI:  0.239 m AV Peak Grad:      7.1 mmHg AV Mean Grad:      4.0 mmHg LVOT Vmax:         96.20 cm/s LVOT Vmean:        63.700 cm/s LVOT VTI:          0.153 m LVOT/AV VTI ratio: 0.64 MITRAL VALVE MV Area (PHT): 3.72 cm    SHUNTS MV Decel Time: 204 msec    Systemic VTI:  0.15 m MV E velocity: 83.40 cm/s  Systemic Diam: 1.78 cm MV A velocity: 74.30 cm/s MV E/A ratio:  1.12 Carlyle Dolly MD Electronically signed by Carlyle Dolly MD Signature Date/Time: 10/17/2022/9:58:21 AM    Final     Scheduled Meds:  amiodarone  200 mg Oral Daily   aspirin EC  81 mg Oral Daily   metoprolol succinate  25 mg Oral Daily   potassium chloride  20 mEq Oral Daily   rosuvastatin  20 mg Oral Daily    Continuous Infusions:   LOS: 1 day     Kayleen Memos, MD [facility] Pager [phone]  If 7PM-7AM, please contact night-coverage [email or web address] Password [facility] 10/17/2022, 2:38 PM

## 2022-10-17 NOTE — Progress Notes (Signed)
Rounding Note    Patient Name: Ruben [name] Date of Encounter: 10/17/2022  [facility] Cardiologist: None   Subjective   Feels sore this morning from sleeping on the hospital bed. Continues to have orthopnea.   Cr improved to 1.26 Net negative 2135   Inpatient Medications    Scheduled Meds:  amiodarone  200 mg Oral Daily   aspirin EC  81 mg Oral Daily   metoprolol succinate  25 mg Oral Daily   potassium chloride  20 mEq Oral Daily   rosuvastatin  20 mg Oral Daily   Continuous Infusions:  PRN Meds: acetaminophen **OR** acetaminophen, albuterol, HYDROmorphone (DILAUDID) injection, levalbuterol, ondansetron **OR** ondansetron (ZOFRAN) IV   Vital Signs    Vitals:   10/17/22 0243 10/17/22 0807 10/17/22 1247 10/17/22 1946  BP: (!) 148/67 (!) 136/54 (!) 103/91 (!) 139/57  Pulse: 80 85 72 77  Resp: 17 20 20 20   Temp: 98.8 F (37.1 C) 98.3 F (36.8 C) 97.9 F (36.6 C) 97.9 F (36.6 C)  TempSrc: Oral Oral Oral Oral  SpO2: 94% 96% 98% 94%  Weight:      Height:        Intake/Output Summary (Last 24 hours) at 10/17/2022 2015 Last data filed at 10/17/2022 1700 Gross per 24 hour  Intake 840 ml  Output 3560 ml  Net -2720 ml       10/17/2022    1:00 AM 10/16/2022    9:36 PM 10/16/2022    6:25 PM  Last 3 Weights  Weight (lbs) 135 lb 5.8 oz 135 lb 5.8 oz 130 lb 1.1 oz  Weight (kg) 61.4 kg 61.4 kg 59 kg      Telemetry    NSR- Personally Reviewed  ECG    No new ECG - Personally Reviewed  Physical Exam   GEN: Comfortable, NAD Neck: No JVD Cardiac: RRR, no murmurs, rubs, or gallops.  Respiratory: Absent breath sounds left lung base. Diminished at right base GI: Soft, nontender, non-distended  MS: No edema; No deformity. Neuro:  Nonfocal  Psych: Normal affect   Labs    High Sensitivity Troponin:   Recent Labs  Lab 10/02/22 1215 10/16/22 1446 10/16/22 1753 10/16/22 2134 10/16/22 2330  TROPONINIHS 6,270* 80* 99* 113* 119*       Chemistry Recent Labs  Lab 10/16/22 1446 10/16/22 2330  NA 142 140  K 4.6 4.0  CL 107 102  CO2 20* 25  GLUCOSE 101* 115*  BUN 13 13  CREATININE 1.26* 1.38*  CALCIUM 9.2 9.1  PROT 6.6 6.0*  ALBUMIN 3.2* 3.1*  AST 43* 32  ALT 42 40  ALKPHOS 27* 24*  BILITOT 1.1 0.8  GFRNONAA 59* 53*  ANIONGAP 15 13     Lipids No results for input(s): "CHOL", "TRIG", "HDL", "LABVLDL", "LDLCALC", "CHOLHDL" in the last 168 hours.  Hematology Recent Labs  Lab 10/16/22 1446 10/16/22 2330  WBC 10.1 10.0  RBC 2.73* 2.71*  HGB 9.0* 8.9*  HCT 29.5* 27.9*  MCV 108.1* 103.0*  MCH 33.0 32.8  MCHC 30.5 31.9  RDW 20.9* 20.9*  PLT 435* 414*    Thyroid No results for input(s): "TSH", "FREET4" in the last 168 hours.  BNP Recent Labs  Lab 10/16/22 1446  BNP 256.2*     DDimer No results for input(s): "DDIMER" in the last 168 hours.   Radiology    VAS [place] LOWER EXTREMITY VENOUS (DVT)  Result Date: 10/17/2022  Lower Venous DVT Study Patient Name:  Ruben [name]  Date of Exam:   10/17/2022 Medical Rec #: [id number]         Accession #:    [id number] Date of Birth: 1945/12/25        Patient Gender: M Patient Age:   77 years Exam Location:  [facility] Procedure:      VAS [place] LOWER EXTREMITY VENOUS (DVT) Referring Phys: Skip Mayer --------------------------------------------------------------------------------  Indications: Edema, SOB, and elevated troponin and BNP, recent CABG.  Comparison Study: No prior study on file Performing Technologist: Sherren Kerns RVS  Examination Guidelines: A complete evaluation includes B-mode imaging, spectral Doppler, color Doppler, and power Doppler as needed of all accessible portions of each vessel. Bilateral testing is considered an integral part of a complete examination. Limited examinations for reoccurring indications may be performed as noted. The reflux portion of the exam is performed with the patient in reverse Trendelenburg.   +--------+---------------+---------+-----------+----------------+-------------+ RIGHT   CompressibilityPhasicitySpontaneityProperties      Thrombus                                                                 Aging         +--------+---------------+---------+-----------+----------------+-------------+ CFV     Full                               pulsatile                                                                waveforms                     +--------+---------------+---------+-----------+----------------+-------------+ SFJ     Full                                                             +--------+---------------+---------+-----------+----------------+-------------+ FV Prox Full                                                             +--------+---------------+---------+-----------+----------------+-------------+ FV Mid  Full                               pulsatile                                                                waveforms                     +--------+---------------+---------+-----------+----------------+-------------+  FV      Full                                                             Distal                                                                   +--------+---------------+---------+-----------+----------------+-------------+ PFV     Full                                                             +--------+---------------+---------+-----------+----------------+-------------+ POP     Full                               pulsatile                                                                waveforms                     +--------+---------------+---------+-----------+----------------+-------------+ PTV     Full                                                             +--------+---------------+---------+-----------+----------------+-------------+ PERO    Full                                                              +--------+---------------+---------+-----------+----------------+-------------+   +--------+---------------+---------+-----------+----------------+-------------+ LEFT    CompressibilityPhasicitySpontaneityProperties      Thrombus                                                                 Aging         +--------+---------------+---------+-----------+----------------+-------------+ CFV     Full                               pulsatile  waveforms                     +--------+---------------+---------+-----------+----------------+-------------+ SFJ     Full                                                             +--------+---------------+---------+-----------+----------------+-------------+ FV Prox Full                                                             +--------+---------------+---------+-----------+----------------+-------------+ FV Mid  Full                               pulsatile                                                                waveforms                     +--------+---------------+---------+-----------+----------------+-------------+ FV      Full                                                             Distal                                                                   +--------+---------------+---------+-----------+----------------+-------------+ PFV     Full                                                             +--------+---------------+---------+-----------+----------------+-------------+ POP     Full                               pulsatile                                                                waveforms                     +--------+---------------+---------+-----------+----------------+-------------+ PTV  Full                                                              +--------+---------------+---------+-----------+----------------+-------------+ PERO    Full                                                             +--------+---------------+---------+-----------+----------------+-------------+     Summary: BILATERAL: - No evidence of deep vein thrombosis seen in the lower extremities, bilaterally. -No evidence of popliteal cyst, bilaterally. RIGHT: Pulsatile waveforms consistent with fluid overload  LEFT: Pulsatile waveforms consistent with fluid overload.  *See table(s) above for measurements and observations. Electronically signed by Coral Else MD on 10/17/2022 at 1:30:38 PM.    Final    CT CHEST WO CONTRAST  Result Date: 10/17/2022 CLINICAL DATA:  67-year-old male with suspected pericardial disease. EXAM: CT CHEST WITHOUT CONTRAST TECHNIQUE: Multidetector CT imaging of the chest was performed following the standard protocol without IV contrast. RADIATION DOSE REDUCTION: This exam was performed according to the departmental dose-optimization program which includes automated exposure control, adjustment of the mA and/or kV according to patient size and/or use of iterative reconstruction technique. COMPARISON:  Chest CT 10/03/2022. FINDINGS: Cardiovascular: Heart size is normal. Trace amount of pericardial fluid and/or thickening, unlikely to be of any hemodynamic significance at this time. No pericardial calcification. There is aortic atherosclerosis, as well as atherosclerosis of the great vessels of the mediastinum and the coronary arteries, including calcified atherosclerotic plaque in the left main, left anterior descending, left circumflex and right coronary arteries. Status post median sternotomy for CABG including LIMA to the LAD. Status post left atrial appendage ligation with ligation clip noted. Mediastinum/Nodes: No pathologically enlarged mediastinal or hilar lymph nodes. Esophagus is unremarkable in appearance. No axillary  lymphadenopathy. Lungs/Pleura: Moderate right and large left pleural effusions with extensive passive subsegmental atelectasis in the dependent portions of the lungs bilaterally. No acute consolidative airspace disease. No definite suspicious appearing pulmonary nodules or masses are noted. Upper Abdomen: 9 mm calcified gallstone in the gallbladder. 1.6 cm low-attenuation lesion in segment 7 of the liver incompletely characterized on today's noncontrast CT examination, but similar to the prior study and statistically likely a cyst (no imaging follow-up recommended). 7 mm nonobstructive calculus in the upper pole collecting system of the left kidney. Cortical calcification in the upper pole of the right kidney. Aortic atherosclerosis. Musculoskeletal: Median sternotomy wires. There are no aggressive appearing lytic or blastic lesions noted in the visualized portions of the skeleton. IMPRESSION: 1. Postoperative changes of recent CABG with trace amount of pericardial fluid and/or thickening, unlikely to be of any hemodynamic significance at this time. 2. Moderate right and large left pleural effusions lying dependently with extensive passive atelectasis in the dependent portions of the lungs bilaterally. 3. Aortic atherosclerosis, in addition to left main and three-vessel coronary artery disease. Status post median sternotomy for CABG including LIMA to the LAD. Patient is also status post left atrial appendage ligation. 4. Additional incidental findings, as above. Aortic Atherosclerosis (ICD10-I70.0). Electronically Signed   By: Trudie Reed M.D.   On: 10/17/2022 11:04  ECHOCARDIOGRAM COMPLETE  Result Date: 10/17/2022    ECHOCARDIOGRAM REPORT   Patient Name:   Ruben [name] Date of Exam: 10/17/2022 Medical Rec #:  [id number]        Height:       68.0 in Accession #:    [id number]       Weight:       135.4 lb Date of Birth:  1946/01/25       BSA:          1.731 m Patient Age:    76 years         BP:            148/67 mmHg Patient Gender: M                HR:           75 bpm. Exam Location:  Inpatient Procedure: 2D Echo, Cardiac Doppler and Color Doppler Indications:    CHF-Acute Diastolic I50.31  History:        Patient has prior history of Echocardiogram examinations, most                 recent 10/02/2022. Previous Myocardial Infarction, Prior CABG,                 TIA, Arrythmias:Atrial Fibrillation; Risk Factors:Dyslipidemia.  Sonographer:    Lucendia Herrlich Referring Phys: [id number] SARA-MAIZ A THOMAS  Sonographer Comments: No parasternal window and suboptimal apical window. IMPRESSIONS  1. Left ventricular ejection fraction, by estimation, is 55 to 60%. The left ventricle has normal function. Left ventricular endocardial border not optimally defined to evaluate regional wall motion. Left ventricular diastolic parameters are indeterminate.  2. Right ventricular systolic function is normal. The right ventricular size is normal. Tricuspid regurgitation signal is inadequate for assessing PA pressure.  3. Large pleural effusion in the left lateral region.  4. The mitral valve is normal in structure. No evidence of mitral valve regurgitation. No evidence of mitral stenosis.  5. The aortic valve was not well visualized. Aortic valve regurgitation is not visualized. No aortic stenosis is present.  6. The inferior vena cava is normal in size with greater than 50% respiratory variability, suggesting right atrial pressure of 3 mmHg. FINDINGS  Left Ventricle: Left ventricular ejection fraction, by estimation, is 55 to 60%. The left ventricle has normal function. Left ventricular endocardial border not optimally defined to evaluate regional wall motion. The left ventricular internal cavity size was normal in size. There is no left ventricular hypertrophy. Left ventricular diastolic parameters are indeterminate. Right Ventricle: The right ventricular size is normal. Right vetricular wall thickness was not well visualized. Right  ventricular systolic function is normal. Tricuspid regurgitation signal is inadequate for assessing PA pressure. Left Atrium: Left atrial size was normal in size. Right Atrium: Right atrial size was normal in size. Pericardium: There is no evidence of pericardial effusion. Mitral Valve: The mitral valve is normal in structure. No evidence of mitral valve regurgitation. No evidence of mitral valve stenosis. Tricuspid Valve: The tricuspid valve is normal in structure. Tricuspid valve regurgitation is trivial. No evidence of tricuspid stenosis. Aortic Valve: The aortic valve was not well visualized. Aortic valve regurgitation is not visualized. No aortic stenosis is present. Aortic valve mean gradient measures 4.0 mmHg. Aortic valve peak gradient measures 7.1 mmHg. Aortic valve area, by VTI measures 1.60 cm. Pulmonic Valve: The pulmonic valve was not well visualized. Pulmonic valve regurgitation is not visualized. No evidence of pulmonic stenosis. Aorta:  The aortic root was not well visualized. Venous: The inferior vena cava is normal in size with greater than 50% respiratory variability, suggesting right atrial pressure of 3 mmHg. IAS/Shunts: No atrial level shunt detected by color flow Doppler. Additional Comments: There is a large pleural effusion in the left lateral region.  LEFT VENTRICLE PLAX 2D LVOT diam:     1.78 cm   Diastology LV SV:         38        LV e' medial:    6.31 cm/s LV SV Index:   22        LV E/e' medial:  13.2 LVOT Area:     2.49 cm  LV e' lateral:   7.07 cm/s                          LV E/e' lateral: 11.8  RIGHT VENTRICLE             IVC RV S prime:     13.20 cm/s  IVC diam: 1.70 cm AORTIC VALVE AV Area (Vmax):    1.80 cm AV Area (Vmean):   1.73 cm AV Area (VTI):     1.60 cm AV Vmax:           133.00 cm/s AV Vmean:          91.600 cm/s AV VTI:            0.239 m AV Peak Grad:      7.1 mmHg AV Mean Grad:      4.0 mmHg LVOT Vmax:         96.20 cm/s LVOT Vmean:        63.700 cm/s LVOT VTI:           0.153 m LVOT/AV VTI ratio: 0.64 MITRAL VALVE MV Area (PHT): 3.72 cm    SHUNTS MV Decel Time: 204 msec    Systemic VTI:  0.15 m MV E velocity: 83.40 cm/s  Systemic Diam: 1.78 cm MV A velocity: 74.30 cm/s MV E/A ratio:  1.12 Dina Rich MD Electronically signed by Dina Rich MD Signature Date/Time: 10/17/2022/9:58:21 AM    Final    DG Chest 2 View  Result Date: 10/16/2022 CLINICAL DATA:  Shortness of breath. EXAM: CHEST - 2 VIEW COMPARISON:  October 07, 2022. FINDINGS: The heart size and mediastinal contours are within normal limits. Status post coronary artery bypass graft. Bilateral pleural effusions are noted, left greater than right. Probable mild left basilar subsegmental atelectasis is noted. The visualized skeletal structures are unremarkable. IMPRESSION: Bilateral pleural effusions are noted, left greater than right. Probable mild left basilar subsegmental atelectasis. Electronically Signed   By: Lupita Raider M.D.   On: 10/16/2022 13:57    Cardiac Studies   TTE 10/02/22: IMPRESSIONS     1. Left ventricular ejection fraction, by estimation, is 60 to 65%. The  left ventricle has normal function. The left ventricle has no regional  wall motion abnormalities. Left ventricular diastolic parameters are  consistent with Grade I diastolic  dysfunction (impaired relaxation).   2. Right ventricular systolic function is normal. The right ventricular  size is normal. There is normal pulmonary artery systolic pressure.   3. The mitral valve is normal in structure. Trivial mitral valve  regurgitation.   4. The aortic valve is normal in structure. Aortic valve regurgitation is not visualized     Ost LM to Mid LM lesion is 75% stenosed.  Ost RCA to Prox RCA lesion is 90% stenosed.   Dist LM to Ost LAD lesion is 100% stenosed.   Ost Cx to Dist Cx lesion is 50% stenosed.   Prox RCA to Mid RCA lesion is 50% stenosed.   The left ventricular systolic function is normal.   LV end  diastolic pressure is normal.   The left ventricular ejection fraction is 50-55% by visual estimate.   There is no mitral valve regurgitation.   Conclusion Status post non-STEMI presentation   Left heart cath right radial approach   Left ventriculogram showed preserved left ventricular function EF of at least 55%   Coronaries Left main large ostial 75 hazy calcified lesion LAD 100% occluded ostially TIMI I flow IRA Circumflex medium to small diffuse 50% RCA large 90% proximal 50% mid TIMI-3 flow Right dominant system Faint collaterals   Intervention deferred not indicated Recommend coronary bypass surgery at a tertiary care center  Patient Profile     77 y.o. male  with a hx of CAD s/p 3-vessel CABG (LIMA to LAD; SVG to OM1; and SVG to PDA) with left atrial appendage ligation on 10/05/22, paroxysmal atrial fibrillation, prior TIA, and HLD who presented with worsening SOB found to have acute on chronic diastolic HF exacerbation for which Cardiology was consulted.  Assessment & Plan   #Left Sided Pleural Effusion: #Acute on Chronic Diastolic HF Exacerbation: -TTE pre-CABG with EF 60-65%, G1DD, normal RV, trivial MR -Repeat TTE on admission with LVEF 55-60%, normal RV, no significant valve disease, RAP , normal E/e' -Presented with worsening SOB and orthopnea found to have bilateral pleural effusions and mildly elevated BNP 256 -Received IV lasix with improvement and normal filling pressures on echo -Plan for left sided thoracentesis -Will hold further IV diuretics for today as does not appear intravascularly overloaded and suspect the left sided pleural effusion is the main cause of symptoms  #CAD s/p CABG: -S/p 3v CABG on 10/05/22 with LIMA to LAD; SVG to OM1; and SVG to PDA -Trop minimally elevated likely due to demand in the setting of acute on chronic diastolic HF exacerbation as above -Continue ASA 81mg  daily -Continue crestor 20mg  daily  #Paroxysmal  Afib: -Maintaining NSR -Holding AC for now given plans for possible thoracentesis -Continue amiodarone 200mg  daily -Continue metop succinate 25mg  XL daily  #HLD: -Continue crestor 20mg  daily      For questions or updates, please contact [facility] Please consult [email or web address] for contact info under        Signed, Meriam Sprague, MD  10/17/2022, 8:15 PM

## 2022-10-18 ENCOUNTER — Inpatient Hospital Stay (HOSPITAL_COMMUNITY): Payer: PPO

## 2022-10-18 DIAGNOSIS — R0601 Orthopnea: Secondary | ICD-10-CM | POA: Diagnosis not present

## 2022-10-18 LAB — BASIC METABOLIC PANEL
Anion gap: 10 (ref 5–15)
BUN: 13 mg/dL (ref 8–23)
CO2: 24 mmol/L (ref 22–32)
Calcium: 8.7 mg/dL — ABNORMAL LOW (ref 8.9–10.3)
Chloride: 103 mmol/L (ref 98–111)
Creatinine, Ser: 1.26 mg/dL — ABNORMAL HIGH (ref 0.61–1.24)
GFR, Estimated: 59 mL/min — ABNORMAL LOW (ref >60)
Glucose, Bld: 108 mg/dL — ABNORMAL HIGH (ref 70–99)
Potassium: 3.8 mmol/L (ref 3.5–5.1)
Sodium: 137 mmol/L (ref 135–145)

## 2022-10-18 LAB — CBC
HCT: 27.5 % — ABNORMAL LOW (ref 39.0–52.0)
Hemoglobin: 8.9 g/dL — ABNORMAL LOW (ref 13.0–17.0)
MCH: 33 pg (ref 26.0–34.0)
MCHC: 32.4 g/dL (ref 30.0–36.0)
MCV: 101.9 fL — ABNORMAL HIGH (ref 80.0–100.0)
Platelets: 480 10*3/uL — ABNORMAL HIGH (ref 150–400)
RBC: 2.7 MIL/uL — ABNORMAL LOW (ref 4.22–5.81)
RDW: 19.9 % — ABNORMAL HIGH (ref 11.5–15.5)
WBC: 9.7 10*3/uL (ref 4.0–10.5)
nRBC: 0 % (ref 0.0–0.2)

## 2022-10-18 LAB — MAGNESIUM: Magnesium: 2.1 mg/dL (ref 1.7–2.4)

## 2022-10-18 MED ORDER — LIDOCAINE HCL (PF) 1 % IJ SOLN
INTRAMUSCULAR | Status: AC
  Filled 2022-10-18: qty 30

## 2022-10-18 MED ORDER — BISACODYL 10 MG RE SUPP
10.0000 mg | Freq: Once | RECTAL | Status: AC
  Administered 2022-10-18: 10 mg via RECTAL
  Filled 2022-10-18: qty 1

## 2022-10-18 MED ORDER — PNEUMOCOCCAL 20-VAL CONJ VACC 0.5 ML IM SUSY
0.5000 mL | PREFILLED_SYRINGE | INTRAMUSCULAR | Status: DC
  Filled 2022-10-18: qty 0.5

## 2022-10-18 NOTE — Progress Notes (Addendum)
.  Assumed pt care at 1500. Aox4, tele monitored, no complaints of CP/SOB. Sutures removed. Ice and heat packs given for shoulder pain. Full assessment per flowsheets, Meds given per [facility]. Safety maintained, call bell in reach, all needs made know.

## 2022-10-18 NOTE — Progress Notes (Addendum)
      [address]       [place] [address]             [phone]      Mr. Ruben [name] had left thoracentesis this morning with 1.1L fluid removed.  He is sitting up eating lunch and reports feeling much better.  Follow up CXR is showing a possible PTX on the RIGHT.  Agree with monitoring over night and repeat the CXR in AM.  Encourage incentive spirometry.   Will follow.  Macarthur Critchley, PA-C   Patient seen walking in hallway this AM Feels much better after thora.  ? R PNX - may be spurious, will see. XR ordered for tomorrow AM Considerations will be when to restart eliquis, had LAA clipped with surgery Likely this will be 48 hours after thora.  Justice Rocher MD

## 2022-10-18 NOTE — Progress Notes (Signed)
PROGRESS NOTE  Ruben [name] [id number] DOB: Apr 22, 1946 DOA: 10/16/2022 PCP: [name], MD  HPI/Recap of past 24 hours: Ruben [name] is a 77 y.o. male with medical history significant of  TIA, atrial fibrillation, peptic ulcer disease, hyperlipidemia recent admission 3/16-3/24 for diagnosis of NSTEMI for which he underwent 3vCABG  as well as application of left atrial appendage clip on 10/05/22 by Dr Cyndia Bent. Presents to ED with complaint of sob, orthopnea  and chest discomfort with movement.  Cardiology was consulted.  Subsequently was seen by cardiothoracic surgery.  10/18/2022: Underwent thora with 1.1L out with improvement in orthopnea. CTS follows and will hold diuretic today. Plan for CXR tomorrow morning.     Assessment/Plan: Principal Problem:   Orthopnea  Post CABG b/l Pleural Effusion with Recumbent Hypoxemia Left greater than right Left thoracentesis planned, last dose of Eliquis was yesterday. Home Eliquis currently on hold due to planned left thoracentesis.  IR following for thoracentesis. -concern for CHF/ as well as Pericardial effusion  -CT thorax for further evaluation as CABG 10 days ago  Cardiology and cardiothoracic surgery are following. -s/p thora  Elevated troponin No evidence of acute ischemia on twelve-lead EKG Follow 2D echo Rest of management per cardiology   CAD s/p NSTEMI s/p 3V CABG 10/05/22 No chest pain reported. Continue ASA, metoprolol , crestor   Paroxysmal atrial fibrillation  -s/p LAA clip - continue on amiodarone started post op cabg History of afib prior to CABG Resume home Eliquis when okay with cardiothoracic surgery   TIA Continue home secondary prophylaxis.   Peptic ulcer disease Continue home PPI   Hyperlipidemia  Continue home statin    DVT prophylaxis: on Eliquis Code Status: full/ as discussed per patient wishes in event of cardiac arrest   Family Communication: Disposition Plan: patient  expected to  be admitted greater than 2 midnights  Consults called: cardiology  Status is: Inpatient The patient requires at least 2 midnights for further evaluation and treatment of present condition.    Objective: Vitals:   10/17/22 1247 10/17/22 1946 10/18/22 0411 10/18/22 0731  BP: (!) 103/91 (!) 139/57 (!) 130/50 138/70  Pulse: 72 77 77 83  Resp: 20 20 15 18   Temp: 97.9 F (36.6 C) 97.9 F (36.6 C) 98.3 F (36.8 C) 98 F (36.7 C)  TempSrc: Oral Oral Oral Oral  SpO2: 98% 94% 92% 94%  Weight:   61.4 kg   Height:        Intake/Output Summary (Last 24 hours) at 10/18/2022 1324 Last data filed at 10/18/2022 0900 Gross per 24 hour  Intake 120 ml  Output 1175 ml  Net -1055 ml    Filed Weights   10/16/22 2136 10/17/22 0100 10/18/22 0411  Weight: 61.4 kg 61.4 kg 61.4 kg    Exam:  General: 77 y.o. year-old male well developed well nourished in no acute distress.  Alert and oriented x3. Cardiovascular: Regular rate and rhythm with no rubs or gallops Respiratory: Clear to auscultation with no wheezes or rales. Good inspiratory effort. Abdomen: Soft nontender nondistended with normal bowel sounds x4 quadrants. Musculoskeletal: No lower extremity edema. Skin: No ulcerative lesions noted or rashes, Psychiatry: Mood is appropriate for condition and setting   Data Reviewed: CBC: Recent Labs  Lab 10/16/22 1446 10/16/22 2330 10/18/22 0030  WBC 10.1 10.0 9.7  NEUTROABS 8.3*  --   --   HGB 9.0* 8.9* 8.9*  HCT 29.5* 27.9* 27.5*  MCV 108.1* 103.0* 101.9*  PLT 435* 414* 480*  Basic Metabolic Panel: Recent Labs  Lab 10/16/22 1446 10/16/22 2330 10/18/22 0030  NA 142 140 137  K 4.6 4.0 3.8  CL 107 102 103  CO2 20* 25 24  GLUCOSE 101* 115* 108*  BUN 13 13 13   CREATININE 1.26* 1.38* 1.26*  CALCIUM 9.2 9.1 8.7*  MG  --   --  2.1    GFR: Estimated Creatinine Clearance: 44 mL/min (A) (by C-G formula based on SCr of 1.26 mg/dL (H)). Liver Function Tests: Recent Labs  Lab  10/16/22 1446 10/16/22 2330  AST 43* 32  ALT 42 40  ALKPHOS 27* 24*  BILITOT 1.1 0.8  PROT 6.6 6.0*  ALBUMIN 3.2* 3.1*    No results for input(s): "LIPASE", "AMYLASE" in the last 168 hours. No results for input(s): "AMMONIA" in the last 168 hours. Coagulation Profile: No results for input(s): "INR", "PROTIME" in the last 168 hours. Cardiac Enzymes: No results for input(s): "CKTOTAL", "CKMB", "CKMBINDEX", "TROPONINI" in the last 168 hours. BNP (last 3 results) No results for input(s): "PROBNP" in the last 8760 hours. HbA1C: No results for input(s): "HGBA1C" in the last 72 hours. CBG: No results for input(s): "GLUCAP" in the last 168 hours. Lipid Profile: No results for input(s): "CHOL", "HDL", "LDLCALC", "TRIG", "CHOLHDL", "LDLDIRECT" in the last 72 hours. Thyroid Function Tests: No results for input(s): "TSH", "T4TOTAL", "FREET4", "T3FREE", "THYROIDAB" in the last 72 hours. Anemia Panel: No results for input(s): "VITAMINB12", "FOLATE", "FERRITIN", "TIBC", "IRON", "RETICCTPCT" in the last 72 hours. Urine analysis:    Component Value Date/Time   COLORURINE YELLOW 10/04/2022 2127   APPEARANCEUR CLEAR 10/04/2022 2127   LABSPEC 1.010 10/04/2022 2127   PHURINE 7.0 10/04/2022 2127   GLUCOSEU NEGATIVE 10/04/2022 2127   HGBUR SMALL (A) 10/04/2022 2127   BILIRUBINUR NEGATIVE 10/04/2022 2127   KETONESUR NEGATIVE 10/04/2022 2127   PROTEINUR NEGATIVE 10/04/2022 2127   NITRITE NEGATIVE 10/04/2022 2127   LEUKOCYTESUR NEGATIVE 10/04/2022 2127   Sepsis Labs: @LABRCNTIP (procalcitonin:4,lacticidven:4)  )No results found for this or any previous visit (from the past 240 hour(s)).    Studies: US THORACENTESIS ASP PLEURAL SPACE W/IMG GUIDE  Result Date: 10/18/2022 INDICATION: Recent CABG now with left pleural effusion. Request for therapeutic thoracentesis. EXAM: ULTRASOUND GUIDED LEFT THORACENTESIS MEDICATIONS: 1% lidocaine 10 mL COMPLICATIONS: None immediate. PROCEDURE: An ultrasound  guided thoracentesis was thoroughly discussed with the patient and questions answered. The benefits, risks, alternatives and complications were also discussed. The patient understands and wishes to proceed with the procedure. Written consent was obtained. Ultrasound was performed to localize and mark an adequate pocket of fluid in the left chest. The area was then prepped and draped in the normal sterile fashion. 1% Lidocaine was used for local anesthesia. Under ultrasound guidance a 6 Fr Safe-T-Centesis catheter was introduced. Thoracentesis was performed. The catheter was removed and a dressing applied. FINDINGS: A total of approximately 1.1 L of bloody fluid was removed. IMPRESSION: Successful ultrasound guided left thoracentesis yielding 1.1 L of pleural fluid. Small volume (5-10%) left inferolateral ex vacuo pneumothorax on post-procedure chest x-ray. Procedure performed by: Gareth Eagle, PA-C Electronically Signed   By: Aletta Edouard M.D.   On: 10/18/2022 11:23   DG Chest 1 View  Result Date: 10/18/2022 CLINICAL DATA:  Status post left-sided thoracentesis EXAM: CHEST  1 VIEW COMPARISON:  10/16/2022 plain film.  Chest CT 10/17/2022 FINDINGS: Numerous leads and wires project over the chest. Apical lordotic positioning. Median sternotomy with left atrial appendage occlusion device. Resolution of bilateral pleural effusions. Density difference  over the inferolateral left hemithorax. No congestive failure. Significantly improved left base airspace disease. Minimal residual subsegmental atelectasis at the right lung base. IMPRESSION: Resolution of bilateral pleural effusions. Favor small volume (5-10%) left inferolateral ex vacuo pneumothorax. There are possible vascular shadows peripheral to this density difference and a skin fold is possible but felt less likely. Aortic Atherosclerosis (ICD10-I70.0). Electronically Signed   By: Abigail Miyamoto M.D.   On: 10/18/2022 10:13    Scheduled Meds:  amiodarone  200 mg  Oral Daily   aspirin EC  81 mg Oral Daily   lidocaine (PF)       metoprolol succinate  25 mg Oral Daily   potassium chloride  20 mEq Oral Daily   rosuvastatin  20 mg Oral Daily    Continuous Infusions:   LOS: 2 days   Time spent 48 min  Emilee Hero, MD [facility] Pager [phone]  If 7PM-7AM, please contact night-coverage [email or web address] Password [email or web address] 10/18/2022, 1:24 PM

## 2022-10-18 NOTE — Plan of Care (Signed)
  Problem: Education: Goal: Will demonstrate proper wound care and an understanding of methods to prevent future damage Outcome: Progressing Goal: Knowledge of disease or condition will improve Outcome: Progressing Goal: Knowledge of the prescribed therapeutic regimen will improve Outcome: Progressing Goal: Individualized Educational Video(s) Outcome: Progressing   Problem: Activity: Goal: Risk for activity intolerance will decrease Outcome: Progressing   Problem: Cardiac: Goal: Will achieve and/or maintain hemodynamic stability Outcome: Progressing   Problem: Clinical Measurements: Goal: Postoperative complications will be avoided or minimized Outcome: Progressing   Problem: Respiratory: Goal: Respiratory status will improve Outcome: Progressing   Problem: Skin Integrity: Goal: Wound healing without signs and symptoms of infection Outcome: Progressing Goal: Risk for impaired skin integrity will decrease Outcome: Progressing   Problem: Urinary Elimination: Goal: Ability to achieve and maintain adequate renal perfusion and functioning will improve Outcome: Progressing   Problem: Education: Goal: Understanding of cardiac disease, CV risk reduction, and recovery process will improve Outcome: Progressing Goal: Individualized Educational Video(s) Outcome: Progressing   Problem: Activity: Goal: Ability to tolerate increased activity will improve Outcome: Progressing   Problem: Cardiac: Goal: Ability to achieve and maintain adequate cardiovascular perfusion will improve Outcome: Progressing   Problem: Health Behavior/Discharge Planning: Goal: Ability to safely manage health-related needs after discharge will improve Outcome: Progressing   Problem: Education: Goal: Understanding of CV disease, CV risk reduction, and recovery process will improve Outcome: Progressing Goal: Individualized Educational Video(s) Outcome: Progressing   Problem: Activity: Goal: Ability  to return to baseline activity level will improve Outcome: Progressing   Problem: Cardiovascular: Goal: Ability to achieve and maintain adequate cardiovascular perfusion will improve Outcome: Progressing Goal: Vascular access site(s) Level 0-1 will be maintained Outcome: Progressing   Problem: Health Behavior/Discharge Planning: Goal: Ability to safely manage health-related needs after discharge will improve Outcome: Progressing   Problem: Education: Goal: Ability to demonstrate management of disease process will improve Outcome: Progressing Goal: Ability to verbalize understanding of medication therapies will improve Outcome: Progressing Goal: Individualized Educational Video(s) Outcome: Progressing   Problem: Activity: Goal: Capacity to carry out activities will improve Outcome: Progressing   Problem: Cardiac: Goal: Ability to achieve and maintain adequate cardiopulmonary perfusion will improve Outcome: Progressing   Problem: Education: Goal: Knowledge of General Education information will improve Description: Including pain rating scale, medication(s)/side effects and non-pharmacologic comfort measures Outcome: Progressing   Problem: Health Behavior/Discharge Planning: Goal: Ability to manage health-related needs will improve Outcome: Progressing   Problem: Clinical Measurements: Goal: Ability to maintain clinical measurements within normal limits will improve Outcome: Progressing Goal: Will remain free from infection Outcome: Progressing Goal: Diagnostic test results will improve Outcome: Progressing Goal: Respiratory complications will improve Outcome: Progressing Goal: Cardiovascular complication will be avoided Outcome: Progressing   Problem: Activity: Goal: Risk for activity intolerance will decrease Outcome: Progressing   Problem: Nutrition: Goal: Adequate nutrition will be maintained Outcome: Progressing   Problem: Coping: Goal: Level of anxiety  will decrease Outcome: Progressing   Problem: Elimination: Goal: Will not experience complications related to bowel motility Outcome: Progressing Goal: Will not experience complications related to urinary retention Outcome: Progressing   Problem: Pain Managment: Goal: General experience of comfort will improve Outcome: Progressing   Problem: Safety: Goal: Ability to remain free from injury will improve Outcome: Progressing   Problem: Skin Integrity: Goal: Risk for impaired skin integrity will decrease Outcome: Progressing

## 2022-10-18 NOTE — Procedures (Signed)
PROCEDURE SUMMARY:  Successful US guided left thoracentesis. Yielded 1.1 L of bloody fluid. Patient tolerated procedure well. No immediate complications. EBL = trace   Post procedure chest X-ray reveals small ex-vacuo pneumothorax. Patient remains stable.  Sutton Plake S Caster [place] PA-C 10/18/2022 9:50 AM

## 2022-10-19 ENCOUNTER — Other Ambulatory Visit: Payer: Self-pay | Admitting: Surgery

## 2022-10-19 ENCOUNTER — Inpatient Hospital Stay (HOSPITAL_COMMUNITY): Payer: PPO

## 2022-10-19 DIAGNOSIS — J95811 Postprocedural pneumothorax: Secondary | ICD-10-CM

## 2022-10-19 DIAGNOSIS — I482 Chronic atrial fibrillation, unspecified: Secondary | ICD-10-CM

## 2022-10-19 DIAGNOSIS — R0602 Shortness of breath: Secondary | ICD-10-CM | POA: Insufficient documentation

## 2022-10-19 DIAGNOSIS — R0601 Orthopnea: Secondary | ICD-10-CM | POA: Diagnosis not present

## 2022-10-19 DIAGNOSIS — J939 Pneumothorax, unspecified: Secondary | ICD-10-CM | POA: Insufficient documentation

## 2022-10-19 DIAGNOSIS — I5033 Acute on chronic diastolic (congestive) heart failure: Secondary | ICD-10-CM

## 2022-10-19 DIAGNOSIS — J90 Pleural effusion, not elsewhere classified: Secondary | ICD-10-CM

## 2022-10-19 LAB — BASIC METABOLIC PANEL
Anion gap: 13 (ref 5–15)
BUN: 13 mg/dL (ref 8–23)
CO2: 22 mmol/L (ref 22–32)
Calcium: 8.9 mg/dL (ref 8.9–10.3)
Chloride: 101 mmol/L (ref 98–111)
Creatinine, Ser: 1.1 mg/dL (ref 0.61–1.24)
GFR, Estimated: >60 mL/min (ref >60)
Glucose, Bld: 109 mg/dL — ABNORMAL HIGH (ref 70–99)
Potassium: 4.1 mmol/L (ref 3.5–5.1)
Sodium: 136 mmol/L (ref 135–145)

## 2022-10-19 MED ORDER — METOPROLOL SUCCINATE ER 25 MG PO TB24: 25.0000 mg | ORAL_TABLET | Freq: Every day | ORAL | 1 refills | Status: AC

## 2022-10-19 MED ORDER — APIXABAN 5 MG PO TABS: 5.0000 mg | ORAL_TABLET | Freq: Two times a day (BID) | ORAL | Status: DC

## 2022-10-19 MED ORDER — PHENOL 1.4 % MT LIQD
1.0000 | OROMUCOSAL | Status: DC | PRN
  Administered 2022-10-19: 1 via OROMUCOSAL
  Filled 2022-10-19: qty 177

## 2022-10-19 NOTE — Progress Notes (Signed)
Cardiology follow up appointment arranged on 11/18/22 per Dr Harl Bowie request, see AVS

## 2022-10-19 NOTE — Progress Notes (Signed)
Heart Failure Navigator Progress Note  Assessed for Heart & Vascular TOC clinic readiness.  Patient EF 55-60%, symptoms related to pleural effusions after CABG. .   Navigator will sign off at this time.   Earnestine Leys, BSN, Clinical cytogeneticist Only

## 2022-10-19 NOTE — Discharge Summary (Addendum)
Physician Discharge Summary   Patient: Ruben [name] MRN: [id number] DOB: 12-28-1945  Admit date:     10/16/2022  Discharge date: 10/19/22  Discharge Physician: Murray Hodgkins   PCP: [name], MD   Recommendations at discharge:  Follow-up with cardiothoracic surgery and cardiology as an outpatient, see below  Discharge Diagnoses: Principal Problem:   Pleural effusion on left Active Problems:   Atrial fibrillation, chronic   Orthopnea   Shortness of breath   Acute on chronic diastolic CHF (congestive heart failure)   Pneumothorax  Resolved Problems:   * No resolved hospital problems. [facility] Course: 77 year old man recent NSTEMI, underwent three-vessel CABG, application left atrial appendage clip 3/18, who presented with shortness of breath, orthopnea and chest discomfort 3/29 and was admitted for further evaluation at the request of cardiothoracic surgery.  Seen by cardiothoracic surgery and cardiology.  Treated for acute on chronic diastolic CHF per cardiology with Lasix as well as thoracentesis.  Had small ex vacuo pneumothorax which spontaneously resolved.  Subsequently much improved and cleared for discharge by cardiothoracic surgery and cardiology.  Consultants Cardiology Cardiothoracic surgery  Procedures Successful US guided left thoracentesis. Yielded 1.1 L of bloody fluid. Post procedure chest X-ray reveals small ex-vacuo pneumothorax   Left-sided pleural effusion  Orthopnea  Shortness of breath  Acute on chronic diastolic CHF  Recent CABG  Small ex vacuo pneumothorax Seen by cardiothoracic surgery and cardiology, repeat transthoracic echocardiogram showed normal LVEF, normal RV, no significant valvular disease.  Mildly elevated BNP, treated with Lasix with improvement. Substantially improved with thoracentesis.  Small ex vacuo pneumothorax spontaneously resolved. Symptoms resolved at this point and cleared for discharge No need for maintenance  diuretic at this time.  Suspect symptoms are more related to pleural effusion. Follow-up with Dr. Cyndia Bent 4/17.   #CAD s/p CABG: S/p 3v CABG on 10/05/22 with LIMA to LAD; SVG to OM1; and SVG to PDA Continue ASA 81mg  daily, crestor 20mg  daily   #Paroxysmal Afib: Status post left atrial appendage clip  Maintaining NSR Continue amiodarone, metoprolol changed to succinate formulation Resume anticoagulation   Elevated troponin Context of recent NSTEMI.  Seen by cardiology.  No further evaluation suggested.     Consultants Cardiology Cardiothoracic surgery  Procedures Successful US guided left thoracentesis. Yielded 1.1 L of bloody fluid. Post procedure chest X-ray reveals small ex-vacuo pneumothorax   Disposition: Home health Diet recommendation:  Regular diet DISCHARGE MEDICATION: Note the patient was not on Plavix as of last discharge and was not taking this medication on admission. Allergies as of 10/19/2022       Reactions   Codeine Nausea Only   Naproxen Sodium Other (See Comments)   Bleeding ulcers        Medication List     STOP taking these medications    clopidogrel 75 MG tablet Commonly known as: PLAVIX   metoprolol tartrate 25 MG tablet Commonly known as: LOPRESSOR   predniSONE 10 MG tablet Commonly known as: DELTASONE   traMADol 50 MG tablet Commonly known as: ULTRAM   traZODone 50 MG tablet Commonly known as: DESYREL       TAKE these medications    amiodarone 200 MG tablet Commonly known as: PACERONE Take 1 tablet (200 mg total) by mouth 2 (two) times daily. For 7 days then reduce the dose to 1 tablet (200 mg) twice daily.   apixaban 5 MG Tabs tablet Commonly known as: ELIQUIS Take 1 tablet (5 mg total) by mouth 2 (two) times daily.  aspirin EC 81 MG tablet Take 1 tablet (81 mg total) by mouth daily. Swallow whole.   cholecalciferol 25 MCG (1000 UNIT) tablet Commonly known as: VITAMIN D3 Take 1,000 Units by mouth daily.    cyanocobalamin 1000 MCG tablet Commonly known as: VITAMIN B12 Take 1,000 mcg by mouth daily.   Fe Fum-Vit C-Vit B12-FA Caps capsule Commonly known as: TRIGELS-F FORTE Take 1 capsule by mouth daily after breakfast.   metoprolol succinate 25 MG 24 hr tablet Commonly known as: TOPROL-XL Take 1 tablet (25 mg total) by mouth daily. Start taking on: October 20, 2022   REFRESH OP Place 1 drop into both eyes as needed (dry eye).   rosuvastatin 20 MG tablet Commonly known as: CRESTOR Take 2 tablets (40 mg total) by mouth at bedtime.   vitamin C 250 MG tablet Commonly known as: ASCORBIC ACID Take 250 mg by mouth daily.   zolpidem 5 MG tablet Commonly known as: AMBIEN Take 5 mg by mouth at bedtime as needed for sleep.        Follow-up Information     [name], MD. Schedule an appointment as soon as possible for a visit in 1 week(s).   Specialty: Family Medicine Contact information: [address] [facility] [place] [address] [phone]         Gaye Pollack, MD Follow up on 11/04/2022.   Specialty: Cardiothoracic Surgery Contact information: [address] [place] [address] [place]. Follow up.   Why: Agency will call you to set up apt Contact information: [place] [address] [phone]         [facility] .   Specialty: Radiology Contact information: [address] [id number] Danice Goltz [place] [id number] [phone]        Richardson Dopp T, PA-C Follow up on 11/18/2022.   Specialties: Cardiology, Physician Assistant Why: at 8:25am for your cardiology follow up appointment Contact information: [address] [address] [phone]                Feels better Breathing well Ready to go home Discharge Exam: Filed  Weights   10/17/22 0100 10/18/22 0411 10/19/22 0200  Weight: 61.4 kg 61.4 kg 56.3 kg   Physical Exam Vitals reviewed.  Constitutional:      General: He is not in acute distress.    Appearance: He is not ill-appearing or toxic-appearing.  Cardiovascular:     Rate and Rhythm: Normal rate and regular rhythm.     Heart sounds: No murmur heard. Pulmonary:     Effort: Pulmonary effort is normal. No respiratory distress.     Breath sounds: No wheezing, rhonchi or rales.  Neurological:     Mental Status: He is alert.  Psychiatric:        Mood and Affect: Mood normal.        Behavior: Behavior normal.      Condition at discharge: good  The results of significant diagnostics from this hospitalization (including imaging, microbiology, ancillary and laboratory) are listed below for reference.   Imaging Studies: DG Chest 2 View  Result Date: 10/19/2022 CLINICAL DATA:  Pneumothorax EXAM: CHEST - 2 VIEW COMPARISON:  Chest radiograph 10/18/2022 FINDINGS: Status post median sternotomy and CABG. Atrial appendage occluded device in place. Trace right pleural effusion. No  definite left-sided pneumothorax is visualized. Normal cardiac and mediastinal contours. Left basilar atelectasis. No radiographically apparent displaced rib fractures. Visualized upper abdomen is notable for likely cholelithiasis. IMPRESSION: 1. Trace right pleural effusion. 2. No definite left-sided pneumothorax is visualized. 3. Cholelithiasis. Electronically Signed   By: Marin Roberts M.D.   On: 10/19/2022 07:11   US THORACENTESIS ASP PLEURAL SPACE W/IMG GUIDE  Result Date: 10/18/2022 INDICATION: Recent CABG now with left pleural effusion. Request for therapeutic thoracentesis. EXAM: ULTRASOUND GUIDED LEFT THORACENTESIS MEDICATIONS: 1% lidocaine 10 mL COMPLICATIONS: None immediate. PROCEDURE: An ultrasound guided thoracentesis was thoroughly discussed with the patient and questions answered. The benefits, risks, alternatives and  complications were also discussed. The patient understands and wishes to proceed with the procedure. Written consent was obtained. Ultrasound was performed to localize and mark an adequate pocket of fluid in the left chest. The area was then prepped and draped in the normal sterile fashion. 1% Lidocaine was used for local anesthesia. Under ultrasound guidance a 6 Fr Safe-T-Centesis catheter was introduced. Thoracentesis was performed. The catheter was removed and a dressing applied. FINDINGS: A total of approximately 1.1 L of bloody fluid was removed. IMPRESSION: Successful ultrasound guided left thoracentesis yielding 1.1 L of pleural fluid. Small volume (5-10%) left inferolateral ex vacuo pneumothorax on post-procedure chest x-ray. Procedure performed by: Gareth Eagle, PA-C Electronically Signed   By: Aletta Edouard M.D.   On: 10/18/2022 11:23   DG Chest 1 View  Result Date: 10/18/2022 CLINICAL DATA:  Status post left-sided thoracentesis EXAM: CHEST  1 VIEW COMPARISON:  10/16/2022 plain film.  Chest CT 10/17/2022 FINDINGS: Numerous leads and wires project over the chest. Apical lordotic positioning. Median sternotomy with left atrial appendage occlusion device. Resolution of bilateral pleural effusions. Density difference over the inferolateral left hemithorax. No congestive failure. Significantly improved left base airspace disease. Minimal residual subsegmental atelectasis at the right lung base. IMPRESSION: Resolution of bilateral pleural effusions. Favor small volume (5-10%) left inferolateral ex vacuo pneumothorax. There are possible vascular shadows peripheral to this density difference and a skin fold is possible but felt less likely. Aortic Atherosclerosis (ICD10-I70.0). Electronically Signed   By: Abigail Miyamoto M.D.   On: 10/18/2022 10:13   VAS [place] LOWER EXTREMITY VENOUS (DVT)  Result Date: 10/17/2022  Lower Venous DVT Study Patient Name:  Ruben [name]  Date of Exam:   10/17/2022 Medical Rec #:  [id number]         Accession #:    [id number] Date of Birth: 07-13-1946        Patient Gender: M Patient Age:   17 years Exam Location:  [facility] Procedure:      VAS [place] LOWER EXTREMITY VENOUS (DVT) Referring Phys: Myles Rosenthal --------------------------------------------------------------------------------  Indications: Edema, SOB, and elevated troponin and BNP, recent CABG.  Comparison Study: No prior study on file Performing Technologist: Sharion Dove RVS  Examination Guidelines: A complete evaluation includes B-mode imaging, spectral Doppler, color Doppler, and power Doppler as needed of all accessible portions of each vessel. Bilateral testing is considered an integral part of a complete examination. Limited examinations for reoccurring indications may be performed as noted. The reflux portion of the exam is performed with the patient in reverse Trendelenburg.  +--------+---------------+---------+-----------+----------------+-------------+ RIGHT   CompressibilityPhasicitySpontaneityProperties      Thrombus  Aging         +--------+---------------+---------+-----------+----------------+-------------+ CFV     Full                               pulsatile                                                                waveforms                     +--------+---------------+---------+-----------+----------------+-------------+ SFJ     Full                                                             +--------+---------------+---------+-----------+----------------+-------------+ FV Prox Full                                                             +--------+---------------+---------+-----------+----------------+-------------+ FV Mid  Full                               pulsatile                                                                waveforms                      +--------+---------------+---------+-----------+----------------+-------------+ FV      Full                                                             Distal                                                                   +--------+---------------+---------+-----------+----------------+-------------+ PFV     Full                                                             +--------+---------------+---------+-----------+----------------+-------------+ POP     Full  pulsatile                                                                waveforms                     +--------+---------------+---------+-----------+----------------+-------------+ PTV     Full                                                             +--------+---------------+---------+-----------+----------------+-------------+ PERO    Full                                                             +--------+---------------+---------+-----------+----------------+-------------+   +--------+---------------+---------+-----------+----------------+-------------+ LEFT    CompressibilityPhasicitySpontaneityProperties      Thrombus                                                                 Aging         +--------+---------------+---------+-----------+----------------+-------------+ CFV     Full                               pulsatile                                                                waveforms                     +--------+---------------+---------+-----------+----------------+-------------+ SFJ     Full                                                             +--------+---------------+---------+-----------+----------------+-------------+ FV Prox Full                                                             +--------+---------------+---------+-----------+----------------+-------------+ FV Mid  Full                                pulsatile  waveforms                     +--------+---------------+---------+-----------+----------------+-------------+ FV      Full                                                             Distal                                                                   +--------+---------------+---------+-----------+----------------+-------------+ PFV     Full                                                             +--------+---------------+---------+-----------+----------------+-------------+ POP     Full                               pulsatile                                                                waveforms                     +--------+---------------+---------+-----------+----------------+-------------+ PTV     Full                                                             +--------+---------------+---------+-----------+----------------+-------------+ PERO    Full                                                             +--------+---------------+---------+-----------+----------------+-------------+     Summary: BILATERAL: - No evidence of deep vein thrombosis seen in the lower extremities, bilaterally. -No evidence of popliteal cyst, bilaterally. RIGHT: Pulsatile waveforms consistent with fluid overload  LEFT: Pulsatile waveforms consistent with fluid overload.  *See table(s) above for measurements and observations. Electronically signed by Harold Barban MD on 10/17/2022 at 1:30:38 PM.    Final    CT CHEST WO CONTRAST  Result Date: 10/17/2022 CLINICAL DATA:  73-year-old male with suspected pericardial disease. EXAM: CT CHEST WITHOUT CONTRAST TECHNIQUE: Multidetector CT imaging of the chest was performed following the standard protocol without IV contrast. RADIATION DOSE REDUCTION: This exam was performed according to the departmental dose-optimization program  which includes automated  exposure control, adjustment of the mA and/or kV according to patient size and/or use of iterative reconstruction technique. COMPARISON:  Chest CT 10/03/2022. FINDINGS: Cardiovascular: Heart size is normal. Trace amount of pericardial fluid and/or thickening, unlikely to be of any hemodynamic significance at this time. No pericardial calcification. There is aortic atherosclerosis, as well as atherosclerosis of the great vessels of the mediastinum and the coronary arteries, including calcified atherosclerotic plaque in the left main, left anterior descending, left circumflex and right coronary arteries. Status post median sternotomy for CABG including LIMA to the LAD. Status post left atrial appendage ligation with ligation clip noted. Mediastinum/Nodes: No pathologically enlarged mediastinal or hilar lymph nodes. Esophagus is unremarkable in appearance. No axillary lymphadenopathy. Lungs/Pleura: Moderate right and large left pleural effusions with extensive passive subsegmental atelectasis in the dependent portions of the lungs bilaterally. No acute consolidative airspace disease. No definite suspicious appearing pulmonary nodules or masses are noted. Upper Abdomen: 9 mm calcified gallstone in the gallbladder. 1.6 cm low-attenuation lesion in segment 7 of the liver incompletely characterized on today's noncontrast CT examination, but similar to the prior study and statistically likely a cyst (no imaging follow-up recommended). 7 mm nonobstructive calculus in the upper pole collecting system of the left kidney. Cortical calcification in the upper pole of the right kidney. Aortic atherosclerosis. Musculoskeletal: Median sternotomy wires. There are no aggressive appearing lytic or blastic lesions noted in the visualized portions of the skeleton. IMPRESSION: 1. Postoperative changes of recent CABG with trace amount of pericardial fluid and/or thickening, unlikely to be of any hemodynamic  significance at this time. 2. Moderate right and large left pleural effusions lying dependently with extensive passive atelectasis in the dependent portions of the lungs bilaterally. 3. Aortic atherosclerosis, in addition to left main and three-vessel coronary artery disease. Status post median sternotomy for CABG including LIMA to the LAD. Patient is also status post left atrial appendage ligation. 4. Additional incidental findings, as above. Aortic Atherosclerosis (ICD10-I70.0). Electronically Signed   By: Vinnie Langton M.D.   On: 10/17/2022 11:04   ECHOCARDIOGRAM COMPLETE  Result Date: 10/17/2022    ECHOCARDIOGRAM REPORT   Patient Name:   Ruben [name] Date of Exam: 10/17/2022 Medical Rec #:  [id number]        Height:       68.0 in Accession #:    [id number]       Weight:       135.4 lb Date of Birth:  Dec 03, 1945       BSA:          1.731 m Patient Age:    10 years         BP:           148/67 mmHg Patient Gender: M                HR:           75 bpm. Exam Location:  Inpatient Procedure: 2D Echo, Cardiac Doppler and Color Doppler Indications:    CHF-Acute Diastolic [id number]  History:        Patient has prior history of Echocardiogram examinations, most                 recent 10/02/2022. Previous Myocardial Infarction, Prior CABG,                 TIA, Arrythmias:Atrial Fibrillation; Risk Factors:Dyslipidemia.  Sonographer:    Ronny Flurry Referring Phys: [id number] SARA-MAIZ A THOMAS  Sonographer Comments: No parasternal window  and suboptimal apical window. IMPRESSIONS  1. Left ventricular ejection fraction, by estimation, is 55 to 60%. The left ventricle has normal function. Left ventricular endocardial border not optimally defined to evaluate regional wall motion. Left ventricular diastolic parameters are indeterminate.  2. Right ventricular systolic function is normal. The right ventricular size is normal. Tricuspid regurgitation signal is inadequate for assessing PA pressure.  3. Large pleural effusion  in the left lateral region.  4. The mitral valve is normal in structure. No evidence of mitral valve regurgitation. No evidence of mitral stenosis.  5. The aortic valve was not well visualized. Aortic valve regurgitation is not visualized. No aortic stenosis is present.  6. The inferior vena cava is normal in size with greater than 50% respiratory variability, suggesting right atrial pressure of 3 mmHg. FINDINGS  Left Ventricle: Left ventricular ejection fraction, by estimation, is 55 to 60%. The left ventricle has normal function. Left ventricular endocardial border not optimally defined to evaluate regional wall motion. The left ventricular internal cavity size was normal in size. There is no left ventricular hypertrophy. Left ventricular diastolic parameters are indeterminate. Right Ventricle: The right ventricular size is normal. Right vetricular wall thickness was not well visualized. Right ventricular systolic function is normal. Tricuspid regurgitation signal is inadequate for assessing PA pressure. Left Atrium: Left atrial size was normal in size. Right Atrium: Right atrial size was normal in size. Pericardium: There is no evidence of pericardial effusion. Mitral Valve: The mitral valve is normal in structure. No evidence of mitral valve regurgitation. No evidence of mitral valve stenosis. Tricuspid Valve: The tricuspid valve is normal in structure. Tricuspid valve regurgitation is trivial. No evidence of tricuspid stenosis. Aortic Valve: The aortic valve was not well visualized. Aortic valve regurgitation is not visualized. No aortic stenosis is present. Aortic valve mean gradient measures 4.0 mmHg. Aortic valve peak gradient measures 7.1 mmHg. Aortic valve area, by VTI measures 1.60 cm. Pulmonic Valve: The pulmonic valve was not well visualized. Pulmonic valve regurgitation is not visualized. No evidence of pulmonic stenosis. Aorta: The aortic root was not well visualized. Venous: The inferior vena cava  is normal in size with greater than 50% respiratory variability, suggesting right atrial pressure of 3 mmHg. IAS/Shunts: No atrial level shunt detected by color flow Doppler. Additional Comments: There is a large pleural effusion in the left lateral region.  LEFT VENTRICLE PLAX 2D LVOT diam:     1.78 cm   Diastology LV SV:         38        LV e' medial:    6.31 cm/s LV SV Index:   22        LV E/e' medial:  13.2 LVOT Area:     2.49 cm  LV e' lateral:   7.07 cm/s                          LV E/e' lateral: 11.8  RIGHT VENTRICLE             IVC RV S prime:     13.20 cm/s  IVC diam: 1.70 cm AORTIC VALVE AV Area (Vmax):    1.80 cm AV Area (Vmean):   1.73 cm AV Area (VTI):     1.60 cm AV Vmax:           133.00 cm/s AV Vmean:          91.600 cm/s AV VTI:  0.239 m AV Peak Grad:      7.1 mmHg AV Mean Grad:      4.0 mmHg LVOT Vmax:         96.20 cm/s LVOT Vmean:        63.700 cm/s LVOT VTI:          0.153 m LVOT/AV VTI ratio: 0.64 MITRAL VALVE MV Area (PHT): 3.72 cm    SHUNTS MV Decel Time: 204 msec    Systemic VTI:  0.15 m MV E velocity: 83.40 cm/s  Systemic Diam: 1.78 cm MV A velocity: 74.30 cm/s MV E/A ratio:  1.12 Carlyle Dolly MD Electronically signed by Carlyle Dolly MD Signature Date/Time: 10/17/2022/9:58:21 AM    Final    DG Chest 2 View  Result Date: 10/16/2022 CLINICAL DATA:  Shortness of breath. EXAM: CHEST - 2 VIEW COMPARISON:  October 07, 2022. FINDINGS: The heart size and mediastinal contours are within normal limits. Status post coronary artery bypass graft. Bilateral pleural effusions are noted, left greater than right. Probable mild left basilar subsegmental atelectasis is noted. The visualized skeletal structures are unremarkable. IMPRESSION: Bilateral pleural effusions are noted, left greater than right. Probable mild left basilar subsegmental atelectasis. Electronically Signed   By: Marijo Conception M.D.   On: 10/16/2022 13:57   DG Chest Port 1 View  Result Date: 10/07/2022 CLINICAL  DATA:  History of CABG. EXAM: PORTABLE CHEST 1 VIEW COMPARISON:  10/06/2022 FINDINGS: Pulmonary artery catheter has been removed. Chest tubes have been removed. Right jugular central line is still present. Postsurgical changes compatible with CABG procedure and left atrial appendage clipping. Patchy densities at the medial left lung base are suggestive for atelectasis. Slightly increased densities in the retrocardiac space. Negative for a pneumothorax. IMPRESSION: 1. Removal of chest tubes and pulmonary artery catheter. Negative for pneumothorax. 2. Increased densities at the left lung base. Findings are suggestive for atelectasis. Electronically Signed   By: Markus Daft M.D.   On: 10/07/2022 08:18   DG Chest Port 1 View  Result Date: 10/06/2022 CLINICAL DATA:  Status post coronary bypass graft. EXAM: PORTABLE CHEST 1 VIEW COMPARISON:  October 05, 2022. FINDINGS: The heart size and mediastinal contours are within normal limits. Endotracheal and nasogastric tubes have been removed. Right internal jugular Swan-Ganz catheter is unchanged. Left-sided chest tube is noted with minimal left apical pneumothorax. Right lung is clear. The visualized skeletal structures are unremarkable. IMPRESSION: Left-sided chest tube with minimal left apical pneumothorax. Electronically Signed   By: Marijo Conception M.D.   On: 10/06/2022 08:17   DG Chest Port 1 View  Result Date: 10/05/2022 CLINICAL DATA:  Post CABG x3 EXAM: PORTABLE CHEST 1 VIEW COMPARISON:  Portable exam 1247 hours compared to 10/04/2022 FINDINGS: Tip of endotracheal tube projects 5.0 cm above carina. Nasogastric tube extends into stomach. RIGHT jugular Swan-Ganz catheter tip projects over main pulmonary artery. Epicardial pacing wires present. Mediastinal drains and LEFT thoracostomy tube noted. Normal heart size post median sternotomy, CABG, and LEFT atrial appendage clipping. Mediastinal contours and pulmonary vascularity normal. Atherosclerotic calcification  aorta. Minimal LEFT basilar atelectasis. Trace LEFT apex pneumothorax. Calcified granuloma lower RIGHT chest. IMPRESSION: Postoperative changes of CABG as above. Trace LEFT apex pneumothorax despite thoracostomy tube. Electronically Signed   By: Lavonia Dana M.D.   On: 10/05/2022 13:15   ECHO INTRAOPERATIVE TEE  Result Date: 10/05/2022  *INTRAOPERATIVE TRANSESOPHAGEAL REPORT *  Patient Name:   Ruben [name] Date of Exam: 10/05/2022 Medical Rec #:  [id number]  Height:       68.0 in Accession #:    [id number]       Weight:       125.7 lb Date of Birth:  05-04-1946       BSA:          1.68 m Patient Age:    23 years         BP:           157/67 mmHg Patient Gender: M                HR:           89 bpm. Exam Location:  Anesthesiology Transesophogeal exam was perform intraoperatively during surgical procedure. Patient was closely monitored under general anesthesia during the entirety of examination. Indications:     I25.110 Atherosclerotic heart disease of native coronary artery                  with unstable angina pectoris Sonographer:     Roseanna Rainbow RDCS Performing Phys: [id number] Pollack Diagnosing Phys: Roderic Palau MD Complications: No known complications during this procedure. POST-OP IMPRESSIONS Overall, there were no significant changes from pre-bypass. PRE-OP FINDINGS  Left Ventricle: The left ventricle has normal systolic function, with an ejection fraction of 60-65%. The cavity size was normal. There is moderately increased left ventricular wall thickness. No evidence of left ventricular regional wall motion abnormalities. There is moderate concentric left ventricular hypertrophy. Right Ventricle: The right ventricle has normal systolic function. The cavity was normal. There is no increase in right ventricular wall thickness. Left Atrium: Left atrial size was not assessed. No left atrial/left atrial appendage thrombus was detected. Right Atrium: Right atrial size was not assessed.  Interatrial Septum: No atrial level shunt detected by color flow Doppler. Pericardium: There is no evidence of pericardial effusion. Mitral Valve: The mitral valve is normal in structure. Mitral valve regurgitation is not visualized by color flow Doppler. Tricuspid Valve: The tricuspid valve was normal in structure. Tricuspid valve regurgitation was not visualized by color flow Doppler. Aortic Valve: The aortic valve is normal in structure. Aortic valve regurgitation was not visualized by color flow Doppler. Pulmonic Valve: The pulmonic valve was normal in structure. Pulmonic valve regurgitation is mild by color flow Doppler.  Roderic Palau MD Electronically signed by Roderic Palau MD Signature Date/Time: 10/05/2022/12:55:36 PM    Final    EP STUDY  Result Date: 10/05/2022 See surgical note for result.  DG Chest 2 View  Result Date: 10/04/2022 CLINICAL DATA:  Preop evaluation for coronary bypass grafting EXAM: CHEST - 2 VIEW COMPARISON:  10/02/2022 FINDINGS: Cardiac shadow is within normal limits. Lungs are well aerated bilaterally. No focal infiltrate or sizable effusion is seen. No acute bony abnormality is noted. IMPRESSION: No active cardiopulmonary disease. Electronically Signed   By: Inez Catalina M.D.   On: 10/04/2022 12:10   VAS US CAROTID  Result Date: 10/04/2022 Carotid Arterial Duplex Study Patient Name:  Ruben [name]  Date of Exam:   10/03/2022 Medical Rec #: [id number]         Accession #:    [id number] Date of Birth: 1945/07/22        Patient Gender: M Patient Age:   4 years Exam Location:  [facility] Procedure:      VAS US CAROTID Referring Phys: Lars Pinks --------------------------------------------------------------------------------  Indications:       TIA and pre-op. Risk Factors:      Hypertension,  no history of smoking, coronary artery                    disease. Comparison Study:  Previous 10/28/20 Performing Technologist: McKayla Maag RVT, [place]   Examination Guidelines: A complete evaluation includes B-mode imaging, spectral Doppler, color Doppler, and power Doppler as needed of all accessible portions of each vessel. Bilateral testing is considered an integral part of a complete examination. Limited examinations for reoccurring indications may be performed as noted.  Right Carotid Findings: +---------+--------+-------+--------+------------------------+-----------------+          PSV cm/sEDV    StenosisPlaque Description      Comments                           cm/s                                                     +---------+--------+-------+--------+------------------------+-----------------+ CCA Prox 64      10     <50%    heterogenous and                                                          irregular                                 +---------+--------+-------+--------+------------------------+-----------------+ CCA      67      11                                     intimal           Distal                                                  thickening        +---------+--------+-------+--------+------------------------+-----------------+ ICA Prox 66      12     1-39%   heterogenous and                                                          irregular                                 +---------+--------+-------+--------+------------------------+-----------------+ ICA Mid  78      15                                                       +---------+--------+-------+--------+------------------------+-----------------+ ICA  107     25                                                       Distal                                                                    +---------+--------+-------+--------+------------------------+-----------------+ ECA      94      10                                                        +---------+--------+-------+--------+------------------------+-----------------+ +----------+--------+-------+----------------+-------------------+           PSV cm/sEDV cmsDescribe        Arm Pressure (mmHG) +----------+--------+-------+----------------+-------------------+ [place]:[id number]            Multiphasic, WNL                    +----------+--------+-------+----------------+-------------------+ +---------+--------+--+--------+-+---------+ VertebralPSV cm/s34EDV cm/s8Antegrade +---------+--------+--+--------+-+---------+  Left Carotid Findings: +---------+--------+-------+--------+------------------------+-----------------+          PSV cm/sEDV    StenosisPlaque Description      Comments                           cm/s                                                     +---------+--------+-------+--------+------------------------+-----------------+ CCA Prox 71      10                                     intimal                                                                   thickening        +---------+--------+-------+--------+------------------------+-----------------+ CCA      76      11                                     intimal           Distal  thickening        +---------+--------+-------+--------+------------------------+-----------------+ ICA Prox 48      7      1-39%   heterogenous and                                                          irregular                                 +---------+--------+-------+--------+------------------------+-----------------+ ICA Mid  48      8                                                        +---------+--------+-------+--------+------------------------+-----------------+ ICA      86      27                                                       Distal                                                                     +---------+--------+-------+--------+------------------------+-----------------+ ECA      82      8                                                        +---------+--------+-------+--------+------------------------+-----------------+ +----------+--------+--------+----------------+-------------------+           PSV cm/sEDV cm/sDescribe        Arm Pressure (mmHG) +----------+--------+--------+----------------+-------------------+ Subclavian140             Multiphasic, WNL                    +----------+--------+--------+----------------+-------------------+ +---------+--------+--+--------+--+---------+ VertebralPSV cm/s70EDV cm/s14Antegrade +---------+--------+--+--------+--+---------+   Summary: Right Carotid: Velocities in the right ICA are consistent with a 1-39% stenosis.                Non-hemodynamically significant plaque <50% noted in the CCA. Left Carotid: Velocities in the left ICA are consistent with a 1-39% stenosis. Vertebrals:  Bilateral vertebral arteries demonstrate antegrade flow. Subclavians: Normal flow hemodynamics were seen in bilateral subclavian              arteries. *See table(s) above for measurements and observations.  Electronically signed by Servando Snare MD on 10/04/2022 at 10:36:53 AM.    Final    CT CHEST WO CONTRAST  Result Date: 10/04/2022 CLINICAL DATA:  Status post none ST elevation myocardial infarction. Severe multi-vessel coronary artery disease. Hyperlipidemia. EXAM: CT CHEST WITHOUT CONTRAST TECHNIQUE: Multidetector CT imaging of the  chest was performed following the standard protocol without IV contrast. RADIATION DOSE REDUCTION: This exam was performed according to the departmental dose-optimization program which includes automated exposure control, adjustment of the mA and/or kV according to patient size and/or use of iterative reconstruction technique. COMPARISON:  None Available. FINDINGS: Cardiovascular: Heart size appears normal. Aortic  atherosclerosis and 3 vessel coronary artery calcifications. No pericardial effusion. Mediastinum/Nodes: No enlarged mediastinal or axillary lymph nodes. Thyroid gland, trachea, and esophagus demonstrate no significant findings. Lungs/Pleura: No pleural effusion, airspace consolidation, atelectasis, or pneumothorax. Scarring noted within the posterior lung bases. No suspicious pulmonary nodule or mass. Upper Abdomen: No acute abnormality. 1.3 cm ill-defined low-density structure is noted within the posterior right lobe of liver, image 413/4. Gallstones identified use left 10 the jaw all stable once the gains with lost Musculoskeletal: No chest wall mass or suspicious bone lesions identified. IMPRESSION: 1. No acute cardiopulmonary abnormalities. 2. Coronary artery calcifications. 3. Gallstones. 4. 1.3 cm ill-defined low-density structure is noted within the posterior right lobe of liver. This is incompletely characterized on this exam. In the absence of a known malignancy this is likely benign. Consider follow-up imaging with nonemergent contrast enhanced outpatient liver MRI for more definitive characterization could. 5.  Aortic Atherosclerosis (ICD10-I70.0). Electronically Signed   By: Kerby Moors M.D.   On: 10/04/2022 07:56   CARDIAC CATHETERIZATION  Result Date: 10/02/2022   Ost LM to Mid LM lesion is 75% stenosed.   Ost RCA to Prox RCA lesion is 90% stenosed.   Dist LM to Ost LAD lesion is 100% stenosed.   Ost Cx to Dist Cx lesion is 50% stenosed.   Prox RCA to Mid RCA lesion is 50% stenosed.   The left ventricular systolic function is normal.   LV end diastolic pressure is normal.   The left ventricular ejection fraction is 50-55% by visual estimate.   There is no mitral valve regurgitation. Conclusion Status post non-STEMI presentation Left heart cath right radial approach Left ventriculogram showed preserved left ventricular function EF of at least 55% Coronaries Left main large ostial 75 hazy  calcified lesion LAD 100% occluded ostially TIMI I flow IRA Circumflex medium to small diffuse 50% RCA large 90% proximal 50% mid TIMI-3 flow Right dominant system Faint collaterals Intervention deferred not indicated Recommend coronary bypass surgery at a tertiary care center  ECHOCARDIOGRAM COMPLETE  Result Date: 10/02/2022    ECHOCARDIOGRAM REPORT   Patient Name:   Ruben [name] Date of Exam: 10/02/2022 Medical Rec #:  [id number]        Height:       68.0 in Accession #:    [id number]       Weight:       185.0 lb Date of Birth:  1945/12/09       BSA:          1.977 m Patient Age:    23 years         BP:           124/71 mmHg Patient Gender: M                HR:           56 bpm. Exam Location:  [facility] Procedure: 2D Echo, Cardiac Doppler and Color Doppler Indications:     NSTEMI  History:         Patient has prior history of Echocardiogram examinations, most  recent 10/29/2020. Acute MI, TIA; Arrythmias:Tachycardia.  Sonographer:     Wenda Low Referring Phys:  [id number] [place] TANG Diagnosing Phys: Yolonda Kida MD  Sonographer Comments: Technically difficult study due to poor echo windows and suboptimal parasternal window. IMPRESSIONS  1. Left ventricular ejection fraction, by estimation, is 60 to 65%. The left ventricle has normal function. The left ventricle has no regional wall motion abnormalities. Left ventricular diastolic parameters are consistent with Grade I diastolic dysfunction (impaired relaxation).  2. Right ventricular systolic function is normal. The right ventricular size is normal. There is normal pulmonary artery systolic pressure.  3. The mitral valve is normal in structure. Trivial mitral valve regurgitation.  4. The aortic valve is normal in structure. Aortic valve regurgitation is not visualized. FINDINGS  Left Ventricle: Left ventricular ejection fraction, by estimation, is 60 to 65%. The left ventricle has normal function. The left ventricle has no  regional wall motion abnormalities. The left ventricular internal cavity size was normal in size. There is  no left ventricular hypertrophy. Left ventricular diastolic parameters are consistent with Grade I diastolic dysfunction (impaired relaxation). Right Ventricle: The right ventricular size is normal. No increase in right ventricular wall thickness. Right ventricular systolic function is normal. There is normal pulmonary artery systolic pressure. The tricuspid regurgitant velocity is 1.59 m/s, and  with an assumed right atrial pressure of 3 mmHg, the estimated right ventricular systolic pressure is [id number]. Left Atrium: Left atrial size was normal in size. Right Atrium: Right atrial size was normal in size. Pericardium: There is no evidence of pericardial effusion. Mitral Valve: The mitral valve is normal in structure. There is moderate thickening of the mitral valve leaflet(s). Normal mobility of the mitral valve leaflets. Trivial mitral valve regurgitation. MV peak gradient, 3.0 mmHg. The mean mitral valve gradient is 1.0 mmHg. Tricuspid Valve: The tricuspid valve is normal in structure. Tricuspid valve regurgitation is trivial. Aortic Valve: The aortic valve is normal in structure. Aortic valve regurgitation is not visualized. Aortic valve mean gradient measures 2.0 mmHg. Aortic valve peak gradient measures 3.2 mmHg. Aortic valve area, by VTI measures 2.51 cm. Pulmonic Valve: The pulmonic valve was normal in structure. Pulmonic valve regurgitation is not visualized. Aorta: The ascending aorta was not well visualized. IAS/Shunts: No atrial level shunt detected by color flow Doppler.  LEFT VENTRICLE PLAX 2D LVIDd:         4.30 cm   Diastology LVIDs:         2.00 cm   LV e' medial:    7.18 cm/s LV PW:         1.00 cm   LV E/e' medial:  9.5 LV IVS:        0.80 cm   LV e' lateral:   8.70 cm/s LVOT diam:     1.90 cm   LV E/e' lateral: 7.8 LV SV:         50 LV SV Index:   25 LVOT Area:     2.84 cm  RIGHT  VENTRICLE RV Basal diam:  2.85 cm RV Mid diam:    2.80 cm RV S prime:     15.60 cm/s TAPSE (M-mode): 2.2 cm LEFT ATRIUM             Index        RIGHT ATRIUM           Index LA diam:        3.20 cm 1.62 cm/m   RA Area:  12.10 cm LA Vol (A2C):   64.3 ml 32.52 ml/m  RA Volume:   25.80 ml  13.05 ml/m LA Vol (A4C):   77.9 ml 39.40 ml/m LA Biplane Vol: 71.3 ml 36.06 ml/m  AORTIC VALVE                    PULMONIC VALVE AV Area (Vmax):    2.57 cm     PV Vmax:       0.63 m/s AV Area (Vmean):   2.44 cm     PV Peak grad:  1.6 mmHg AV Area (VTI):     2.51 cm AV Vmax:           88.80 cm/s AV Vmean:          61.300 cm/s AV VTI:            0.199 m AV Peak Grad:      3.2 mmHg AV Mean Grad:      2.0 mmHg LVOT Vmax:         80.60 cm/s LVOT Vmean:        52.700 cm/s LVOT VTI:          0.176 m LVOT/AV VTI ratio: 0.88  AORTA Ao Root diam: 3.10 cm MITRAL VALVE               TRICUSPID VALVE MV Area (PHT): 2.74 cm    TR Peak grad:   10.1 mmHg MV Area VTI:   1.68 cm    TR Vmax:        159.00 cm/s MV Peak grad:  3.0 mmHg MV Mean grad:  1.0 mmHg    SHUNTS MV Vmax:       0.87 m/s    Systemic VTI:  0.18 m MV Vmean:      38.9 cm/s   Systemic Diam: 1.90 cm MV Decel Time: 277 msec MV E velocity: 68.10 cm/s MV A velocity: 84.90 cm/s MV E/A ratio:  0.80 Dwayne D Callwood MD Electronically signed by Yolonda Kida MD Signature Date/Time: 10/02/2022/1:47:29 PM    Final    DG Chest Port 1 View  Result Date: 10/02/2022 CLINICAL DATA:  Palpitations EXAM: PORTABLE CHEST 1 VIEW COMPARISON:  None Available. FINDINGS: The heart size and mediastinal contours are within normal limits. Both lungs are clear. The visualized skeletal structures are unremarkable. IMPRESSION: No active disease. Electronically Signed   By: Placido Sou M.D.   On: 10/02/2022 02:46    Microbiology: Results for orders placed or performed during the hospital encounter of 10/03/22  MRSA Next Gen by PCR, Nasal     Status: None   Collection Time: 10/03/22  9:13  AM   Specimen: Nasal Mucosa; Nasal Swab  Result Value Ref Range Status   MRSA by PCR Next Gen NOT DETECTED NOT DETECTED Final    Comment: (NOTE) The GeneXpert MRSA Assay (FDA approved for NASAL specimens only), is one component of a comprehensive MRSA colonization surveillance program. It is not intended to diagnose MRSA infection nor to guide or monitor treatment for MRSA infections. Test performance is not FDA approved in patients less than 53 years old. Performed at [facility], [place] [address] [place], [place] [address]   SARS Coronavirus 2 by RT PCR (hospital order, performed in [facility] hospital lab) *cepheid single result test* Anterior Nasal Swab     Status: None   Collection Time: 10/04/22  9:27 PM   Specimen: Anterior Nasal Swab  Result  Value Ref Range Status   SARS Coronavirus 2 by RT PCR NEGATIVE NEGATIVE Final    Comment: Performed at [facility], [place] [address] [place], [place] [address]    Labs: CBC: Recent Labs  Lab 10/16/22 1446 10/16/22 2330 10/18/22 0030  WBC 10.1 10.0 9.7  NEUTROABS 8.3*  --   --   HGB 9.0* 8.9* 8.9*  HCT 29.5* 27.9* 27.5*  MCV 108.1* 103.0* 101.9*  PLT 435* 414* 0000000*   Basic Metabolic Panel: Recent Labs  Lab 10/16/22 1446 10/16/22 2330 10/18/22 0030 10/19/22 0033  NA 142 140 137 136  K 4.6 4.0 3.8 4.1  CL 107 102 103 101  CO2 20* 25 24 22   GLUCOSE 101* 115* 108* 109*  BUN 13 13 13 13   CREATININE 1.26* 1.38* 1.26* 1.10  CALCIUM 9.2 9.1 8.7* 8.9  MG  --   --  2.1  --    Liver Function Tests: Recent Labs  Lab 10/16/22 1446 10/16/22 2330  AST 43* 32  ALT 42 40  ALKPHOS 27* 24*  BILITOT 1.1 0.8  PROT 6.6 6.0*  ALBUMIN 3.2* 3.1*   CBG: No results for input(s): "GLUCAP" in the last 168 hours.  Discharge time spent: greater than 30 minutes.  Signed: Murray Hodgkins, MD [facility] 10/19/2022

## 2022-10-19 NOTE — Progress Notes (Addendum)
Rounding Note    Patient Name: Ruben [name] Date of Encounter: 10/19/2022  [place] Cardiologist: None   Subjective   Symptoms improved Crt nl  Inpatient Medications    Scheduled Meds:  amiodarone  200 mg Oral Daily   aspirin EC  81 mg Oral Daily   metoprolol succinate  25 mg Oral Daily   pneumococcal 20-valent conjugate vaccine  0.5 mL Intramuscular Tomorrow-1000   potassium chloride  20 mEq Oral Daily   rosuvastatin  20 mg Oral Daily   Continuous Infusions:  PRN Meds: acetaminophen **OR** acetaminophen, albuterol, HYDROmorphone (DILAUDID) injection, levalbuterol, ondansetron **OR** ondansetron (ZOFRAN) IV, phenol   Vital Signs    Vitals:   10/18/22 2341 10/19/22 0200 10/19/22 0353 10/19/22 0759  BP: (!) 136/55  (!) 131/54 (!) 141/58  Pulse:   87 88  Resp: 17  18 19   Temp: 98.9 F (37.2 C)  99.7 F (37.6 C) 99.3 F (37.4 C)  TempSrc: Oral  Oral Oral  SpO2: 98%  96% 94%  Weight:  56.3 kg    Height:        Intake/Output Summary (Last 24 hours) at 10/19/2022 0929 Last data filed at 10/19/2022 0755 Gross per 24 hour  Intake 480 ml  Output 1200 ml  Net -720 ml      10/19/2022    2:00 AM 10/18/2022    4:11 AM 10/17/2022    1:00 AM  Last 3 Weights  Weight (lbs) 124 lb 3.2 oz 135 lb 5.8 oz 135 lb 5.8 oz  Weight (kg) 56.337 kg 61.4 kg 61.4 kg      Telemetry    NSR- Personally Reviewed  ECG    No new ECG - Personally Reviewed  Physical Exam   GEN: Comfortable, NAD Neck: No JVD Cardiac: RRR, no murmurs, rubs, or gallops.  Respiratory: Absent breath sounds left lung base. Diminished at right base GI: Soft, nontender, non-distended  MS: No edema; No deformity. Neuro:  Nonfocal  Psych: Normal affect   Labs    High Sensitivity Troponin:   Recent Labs  Lab 10/02/22 1215 10/16/22 1446 10/16/22 1753 10/16/22 2134 10/16/22 2330  TROPONINIHS 6,270* 80* 99* 113* 119*     Chemistry Recent Labs  Lab 10/16/22 1446 10/16/22 2330  10/18/22 0030 10/19/22 0033  NA 142 140 137 136  K 4.6 4.0 3.8 4.1  CL 107 102 103 101  CO2 20* 25 24 22   GLUCOSE 101* 115* 108* 109*  BUN 13 13 13 13   CREATININE 1.26* 1.38* 1.26* 1.10  CALCIUM 9.2 9.1 8.7* 8.9  MG  --   --  2.1  --   PROT 6.6 6.0*  --   --   ALBUMIN 3.2* 3.1*  --   --   AST 43* 32  --   --   ALT 42 40  --   --   ALKPHOS 27* 24*  --   --   BILITOT 1.1 0.8  --   --   GFRNONAA 59* 53* 59* >60  ANIONGAP 15 13 10 13     Lipids No results for input(s): "CHOL", "TRIG", "HDL", "LABVLDL", "LDLCALC", "CHOLHDL" in the last 168 hours.  Hematology Recent Labs  Lab 10/16/22 1446 10/16/22 2330 10/18/22 0030  WBC 10.1 10.0 9.7  RBC 2.73* 2.71* 2.70*  HGB 9.0* 8.9* 8.9*  HCT 29.5* 27.9* 27.5*  MCV 108.1* 103.0* 101.9*  MCH 33.0 32.8 33.0  MCHC 30.5 31.9 32.4  RDW 20.9* 20.9* 19.9*  PLT  435* 414* 480*   Thyroid No results for input(s): "TSH", "FREET4" in the last 168 hours.  BNP Recent Labs  Lab 10/16/22 1446  BNP 256.2*    DDimer No results for input(s): "DDIMER" in the last 168 hours.   Radiology    DG Chest 2 View  Result Date: 10/19/2022 CLINICAL DATA:  Pneumothorax EXAM: CHEST - 2 VIEW COMPARISON:  Chest radiograph 10/18/2022 FINDINGS: Status post median sternotomy and CABG. Atrial appendage occluded device in place. Trace right pleural effusion. No definite left-sided pneumothorax is visualized. Normal cardiac and mediastinal contours. Left basilar atelectasis. No radiographically apparent displaced rib fractures. Visualized upper abdomen is notable for likely cholelithiasis. IMPRESSION: 1. Trace right pleural effusion. 2. No definite left-sided pneumothorax is visualized. 3. Cholelithiasis. Electronically Signed   By: Marin Roberts M.D.   On: 10/19/2022 07:11   US THORACENTESIS ASP PLEURAL SPACE W/IMG GUIDE  Result Date: 10/18/2022 INDICATION: Recent CABG now with left pleural effusion. Request for therapeutic thoracentesis. EXAM: ULTRASOUND GUIDED LEFT  THORACENTESIS MEDICATIONS: 1% lidocaine 10 mL COMPLICATIONS: None immediate. PROCEDURE: An ultrasound guided thoracentesis was thoroughly discussed with the patient and questions answered. The benefits, risks, alternatives and complications were also discussed. The patient understands and wishes to proceed with the procedure. Written consent was obtained. Ultrasound was performed to localize and mark an adequate pocket of fluid in the left chest. The area was then prepped and draped in the normal sterile fashion. 1% Lidocaine was used for local anesthesia. Under ultrasound guidance a 6 Fr Safe-T-Centesis catheter was introduced. Thoracentesis was performed. The catheter was removed and a dressing applied. FINDINGS: A total of approximately 1.1 L of bloody fluid was removed. IMPRESSION: Successful ultrasound guided left thoracentesis yielding 1.1 L of pleural fluid. Small volume (5-10%) left inferolateral ex vacuo pneumothorax on post-procedure chest x-ray. Procedure performed by: Gareth Eagle, PA-C Electronically Signed   By: Aletta Edouard M.D.   On: 10/18/2022 11:23   DG Chest 1 View  Result Date: 10/18/2022 CLINICAL DATA:  Status post left-sided thoracentesis EXAM: CHEST  1 VIEW COMPARISON:  10/16/2022 plain film.  Chest CT 10/17/2022 FINDINGS: Numerous leads and wires project over the chest. Apical lordotic positioning. Median sternotomy with left atrial appendage occlusion device. Resolution of bilateral pleural effusions. Density difference over the inferolateral left hemithorax. No congestive failure. Significantly improved left base airspace disease. Minimal residual subsegmental atelectasis at the right lung base. IMPRESSION: Resolution of bilateral pleural effusions. Favor small volume (5-10%) left inferolateral ex vacuo pneumothorax. There are possible vascular shadows peripheral to this density difference and a skin fold is possible but felt less likely. Aortic Atherosclerosis (ICD10-I70.0).  Electronically Signed   By: Abigail Miyamoto M.D.   On: 10/18/2022 10:13   VAS [place] LOWER EXTREMITY VENOUS (DVT)  Result Date: 10/17/2022  Lower Venous DVT Study Patient Name:  Ruben [name]  Date of Exam:   10/17/2022 Medical Rec #: [id number]         Accession #:    [id number] Date of Birth: 1946/05/13        Patient Gender: M Patient Age:   59 years Exam Location:  [facility] Procedure:      VAS [place] LOWER EXTREMITY VENOUS (DVT) Referring Phys: Myles Rosenthal --------------------------------------------------------------------------------  Indications: Edema, SOB, and elevated troponin and BNP, recent CABG.  Comparison Study: No prior study on file Performing Technologist: Sharion Dove RVS  Examination Guidelines: A complete evaluation includes B-mode imaging, spectral Doppler, color Doppler, and power Doppler as needed of  all accessible portions of each vessel. Bilateral testing is considered an integral part of a complete examination. Limited examinations for reoccurring indications may be performed as noted. The reflux portion of the exam is performed with the patient in reverse Trendelenburg.  +--------+---------------+---------+-----------+----------------+-------------+ RIGHT   CompressibilityPhasicitySpontaneityProperties      Thrombus                                                                 Aging         +--------+---------------+---------+-----------+----------------+-------------+ CFV     Full                               pulsatile                                                                waveforms                     +--------+---------------+---------+-----------+----------------+-------------+ SFJ     Full                                                             +--------+---------------+---------+-----------+----------------+-------------+ FV Prox Full                                                              +--------+---------------+---------+-----------+----------------+-------------+ FV Mid  Full                               pulsatile                                                                waveforms                     +--------+---------------+---------+-----------+----------------+-------------+ FV      Full                                                             Distal                                                                   +--------+---------------+---------+-----------+----------------+-------------+  PFV     Full                                                             +--------+---------------+---------+-----------+----------------+-------------+ POP     Full                               pulsatile                                                                waveforms                     +--------+---------------+---------+-----------+----------------+-------------+ PTV     Full                                                             +--------+---------------+---------+-----------+----------------+-------------+ PERO    Full                                                             +--------+---------------+---------+-----------+----------------+-------------+   +--------+---------------+---------+-----------+----------------+-------------+ LEFT    CompressibilityPhasicitySpontaneityProperties      Thrombus                                                                 Aging         +--------+---------------+---------+-----------+----------------+-------------+ CFV     Full                               pulsatile                                                                waveforms                     +--------+---------------+---------+-----------+----------------+-------------+ SFJ     Full                                                              +--------+---------------+---------+-----------+----------------+-------------+  FV Prox Full                                                             +--------+---------------+---------+-----------+----------------+-------------+ FV Mid  Full                               pulsatile                                                                waveforms                     +--------+---------------+---------+-----------+----------------+-------------+ FV      Full                                                             Distal                                                                   +--------+---------------+---------+-----------+----------------+-------------+ PFV     Full                                                             +--------+---------------+---------+-----------+----------------+-------------+ POP     Full                               pulsatile                                                                waveforms                     +--------+---------------+---------+-----------+----------------+-------------+ PTV     Full                                                             +--------+---------------+---------+-----------+----------------+-------------+ PERO    Full                                                             +--------+---------------+---------+-----------+----------------+-------------+  Summary: BILATERAL: - No evidence of deep vein thrombosis seen in the lower extremities, bilaterally. -No evidence of popliteal cyst, bilaterally. RIGHT: Pulsatile waveforms consistent with fluid overload  LEFT: Pulsatile waveforms consistent with fluid overload.  *See table(s) above for measurements and observations. Electronically signed by Harold Barban MD on 10/17/2022 at 1:30:38 PM.    Final    CT CHEST WO CONTRAST  Result Date: 10/17/2022 CLINICAL DATA:  60-year-old male with suspected  pericardial disease. EXAM: CT CHEST WITHOUT CONTRAST TECHNIQUE: Multidetector CT imaging of the chest was performed following the standard protocol without IV contrast. RADIATION DOSE REDUCTION: This exam was performed according to the departmental dose-optimization program which includes automated exposure control, adjustment of the mA and/or kV according to patient size and/or use of iterative reconstruction technique. COMPARISON:  Chest CT 10/03/2022. FINDINGS: Cardiovascular: Heart size is normal. Trace amount of pericardial fluid and/or thickening, unlikely to be of any hemodynamic significance at this time. No pericardial calcification. There is aortic atherosclerosis, as well as atherosclerosis of the great vessels of the mediastinum and the coronary arteries, including calcified atherosclerotic plaque in the left main, left anterior descending, left circumflex and right coronary arteries. Status post median sternotomy for CABG including LIMA to the LAD. Status post left atrial appendage ligation with ligation clip noted. Mediastinum/Nodes: No pathologically enlarged mediastinal or hilar lymph nodes. Esophagus is unremarkable in appearance. No axillary lymphadenopathy. Lungs/Pleura: Moderate right and large left pleural effusions with extensive passive subsegmental atelectasis in the dependent portions of the lungs bilaterally. No acute consolidative airspace disease. No definite suspicious appearing pulmonary nodules or masses are noted. Upper Abdomen: 9 mm calcified gallstone in the gallbladder. 1.6 cm low-attenuation lesion in segment 7 of the liver incompletely characterized on today's noncontrast CT examination, but similar to the prior study and statistically likely a cyst (no imaging follow-up recommended). 7 mm nonobstructive calculus in the upper pole collecting system of the left kidney. Cortical calcification in the upper pole of the right kidney. Aortic atherosclerosis. Musculoskeletal: Median  sternotomy wires. There are no aggressive appearing lytic or blastic lesions noted in the visualized portions of the skeleton. IMPRESSION: 1. Postoperative changes of recent CABG with trace amount of pericardial fluid and/or thickening, unlikely to be of any hemodynamic significance at this time. 2. Moderate right and large left pleural effusions lying dependently with extensive passive atelectasis in the dependent portions of the lungs bilaterally. 3. Aortic atherosclerosis, in addition to left main and three-vessel coronary artery disease. Status post median sternotomy for CABG including LIMA to the LAD. Patient is also status post left atrial appendage ligation. 4. Additional incidental findings, as above. Aortic Atherosclerosis (ICD10-I70.0). Electronically Signed   By: Vinnie Langton M.D.   On: 10/17/2022 11:04   ECHOCARDIOGRAM COMPLETE  Result Date: 10/17/2022    ECHOCARDIOGRAM REPORT   Patient Name:   GARRIS BESCH Date of Exam: 10/17/2022 Medical Rec #:  [id number]        Height:       68.0 in Accession #:    [id number]       Weight:       135.4 lb Date of Birth:  Feb 24, 1946       BSA:          1.731 m Patient Age:    54 years         BP:           148/67 mmHg Patient Gender: M  HR:           75 bpm. Exam Location:  Inpatient Procedure: 2D Echo, Cardiac Doppler and Color Doppler Indications:    CHF-Acute Diastolic [id number]  History:        Patient has prior history of Echocardiogram examinations, most                 recent 10/02/2022. Previous Myocardial Infarction, Prior CABG,                 TIA, Arrythmias:Atrial Fibrillation; Risk Factors:Dyslipidemia.  Sonographer:    Ronny Flurry Referring Phys: [id number] SARA-MAIZ A THOMAS  Sonographer Comments: No parasternal window and suboptimal apical window. IMPRESSIONS  1. Left ventricular ejection fraction, by estimation, is 55 to 60%. The left ventricle has normal function. Left ventricular endocardial border not optimally defined to  evaluate regional wall motion. Left ventricular diastolic parameters are indeterminate.  2. Right ventricular systolic function is normal. The right ventricular size is normal. Tricuspid regurgitation signal is inadequate for assessing PA pressure.  3. Large pleural effusion in the left lateral region.  4. The mitral valve is normal in structure. No evidence of mitral valve regurgitation. No evidence of mitral stenosis.  5. The aortic valve was not well visualized. Aortic valve regurgitation is not visualized. No aortic stenosis is present.  6. The inferior vena cava is normal in size with greater than 50% respiratory variability, suggesting right atrial pressure of 3 mmHg. FINDINGS  Left Ventricle: Left ventricular ejection fraction, by estimation, is 55 to 60%. The left ventricle has normal function. Left ventricular endocardial border not optimally defined to evaluate regional wall motion. The left ventricular internal cavity size was normal in size. There is no left ventricular hypertrophy. Left ventricular diastolic parameters are indeterminate. Right Ventricle: The right ventricular size is normal. Right vetricular wall thickness was not well visualized. Right ventricular systolic function is normal. Tricuspid regurgitation signal is inadequate for assessing PA pressure. Left Atrium: Left atrial size was normal in size. Right Atrium: Right atrial size was normal in size. Pericardium: There is no evidence of pericardial effusion. Mitral Valve: The mitral valve is normal in structure. No evidence of mitral valve regurgitation. No evidence of mitral valve stenosis. Tricuspid Valve: The tricuspid valve is normal in structure. Tricuspid valve regurgitation is trivial. No evidence of tricuspid stenosis. Aortic Valve: The aortic valve was not well visualized. Aortic valve regurgitation is not visualized. No aortic stenosis is present. Aortic valve mean gradient measures 4.0 mmHg. Aortic valve peak gradient measures  7.1 mmHg. Aortic valve area, by VTI measures 1.60 cm. Pulmonic Valve: The pulmonic valve was not well visualized. Pulmonic valve regurgitation is not visualized. No evidence of pulmonic stenosis. Aorta: The aortic root was not well visualized. Venous: The inferior vena cava is normal in size with greater than 50% respiratory variability, suggesting right atrial pressure of 3 mmHg. IAS/Shunts: No atrial level shunt detected by color flow Doppler. Additional Comments: There is a large pleural effusion in the left lateral region.  LEFT VENTRICLE PLAX 2D LVOT diam:     1.78 cm   Diastology LV SV:         38        LV e' medial:    6.31 cm/s LV SV Index:   22        LV E/e' medial:  13.2 LVOT Area:     2.49 cm  LV e' lateral:   7.07 cm/s  LV E/e' lateral: 11.8  RIGHT VENTRICLE             IVC RV S prime:     13.20 cm/s  IVC diam: 1.70 cm AORTIC VALVE AV Area (Vmax):    1.80 cm AV Area (Vmean):   1.73 cm AV Area (VTI):     1.60 cm AV Vmax:           133.00 cm/s AV Vmean:          91.600 cm/s AV VTI:            0.239 m AV Peak Grad:      7.1 mmHg AV Mean Grad:      4.0 mmHg LVOT Vmax:         96.20 cm/s LVOT Vmean:        63.700 cm/s LVOT VTI:          0.153 m LVOT/AV VTI ratio: 0.64 MITRAL VALVE MV Area (PHT): 3.72 cm    SHUNTS MV Decel Time: 204 msec    Systemic VTI:  0.15 m MV E velocity: 83.40 cm/s  Systemic Diam: 1.78 cm MV A velocity: 74.30 cm/s MV E/A ratio:  1.12 Carlyle Dolly MD Electronically signed by Carlyle Dolly MD Signature Date/Time: 10/17/2022/9:58:21 AM    Final     Cardiac Studies   TTE 10/02/22: IMPRESSIONS     1. Left ventricular ejection fraction, by estimation, is 60 to 65%. The  left ventricle has normal function. The left ventricle has no regional  wall motion abnormalities. Left ventricular diastolic parameters are  consistent with Grade I diastolic  dysfunction (impaired relaxation).   2. Right ventricular systolic function is normal. The right  ventricular  size is normal. There is normal pulmonary artery systolic pressure.   3. The mitral valve is normal in structure. Trivial mitral valve  regurgitation.   4. The aortic valve is normal in structure. Aortic valve regurgitation is not visualized   LHC 10/02/2022    Ost LM to Mid LM lesion is 75% stenosed.   Ost RCA to Prox RCA lesion is 90% stenosed.   Dist LM to Ost LAD lesion is 100% stenosed.   Ost Cx to Dist Cx lesion is 50% stenosed.   Prox RCA to Mid RCA lesion is 50% stenosed.   The left ventricular systolic function is normal.   LV end diastolic pressure is normal.   The left ventricular ejection fraction is 50-55% by visual estimate.   There is no mitral valve regurgitation.   Conclusion Status post non-STEMI presentation   Left heart cath right radial approach   Left ventriculogram showed preserved left ventricular function EF of at least 55%   Coronaries Left main large ostial 75 hazy calcified lesion LAD 100% occluded ostially TIMI I flow IRA Circumflex medium to small diffuse 50% RCA large 90% proximal 50% mid TIMI-3 flow Right dominant system Faint collaterals   Intervention deferred not indicated Recommend coronary bypass surgery at a tertiary care center  Patient Profile     77 y.o. male  with a hx of CAD s/p 3-vessel CABG (LIMA to LAD; SVG to OM1; and SVG to PDA) with left atrial appendage ligation on 10/05/22, paroxysmal atrial fibrillation, prior TIA, and HLD who presented with worsening SOB found to have acute on chronic diastolic HF exacerbation for which Cardiology was consulted found to have post surgical pleural effusion s/p thoracentesis c/b PTX, now resolved.  Assessment & Plan   #Left Sided Pleural Effusion: #  Acute on Chronic Diastolic HF Exacerbation: -TTE pre-CABG with EF 60-65%, G1DD, normal RV, trivial MR -Repeat TTE on admission with LVEF 55-60%, normal RV, no significant valve disease, RAP 7mmHg, normal E/e' -Presented with  worsening SOB and orthopnea found to have bilateral pleural effusions and mildly elevated BNP 256 -Received IV lasix with improvement and normal filling pressures on echo -s/p left sided thoracentesis, had small PTX, now resolved   #CAD s/p CABG: -S/p 3v CABG on 10/05/22 with LIMA to LAD; SVG to OM1; and SVG to PDA -Trop minimally elevated likely due to demand  -Continue ASA 81mg  daily; -Continue crestor 20mg  daily  #Paroxysmal Afib: diagnosed on 30 day event monitor Hx of CVA 03/2021 -Maintaining NSR - can restart eliquis 5 mg BID -Continue amiodarone 200mg  daily -Continue metop succinate 25mg  XL daily  #HLD: -Continue crestor 20mg  daily  Otherwise, he can be discharged from a cardiology standpoint. Can restart AC per CT SURG. Will work on follow-up    Time Spent Directly with Patient:  I have spent a total of 35 minutes with the patient reviewing hospital notes, telemetry, EKGs, labs and examining the patient as well as establishing an assessment and plan that was discussed personally with the patient.  > 50% of time was spent in direct patient care.   For questions or updates, please contact [place] Please consult [email or web address] for contact info under        Signed, Janina Mayo, MD  10/19/2022, 9:29 AM

## 2022-10-19 NOTE — TOC Transition Note (Signed)
Transition of Care [facility]) - CM/SW Discharge Note   Patient Details  Name: Ruben [name] MRN: [id number] Date of Birth: 1946-01-14  Transition of Care [facility]) CM/SW Contact:  Zenon Mayo, RN Phone Number: 10/19/2022, 10:31 AM   Clinical Narrative:    Patient is for dc today, NCM notified Rhonda with [facility] of the dc, he is active with [facility], HHPT,  wife states they want to continue with [facility].  Wife is transporting him home.   Final next level of care: [place]     Patient Goals and CMS Choice      Discharge Placement                         Discharge Plan and Services Additional resources added to the After Visit Summary for                                       Social Determinants of Health (SDOH) Interventions SDOH Screenings   Food Insecurity: No Food Insecurity (10/16/2022)  Housing: [place]  (10/16/2022)  Transportation Needs: No Transportation Needs (10/16/2022)  Utilities: Not At Risk (10/16/2022)  Tobacco Use: Low Risk  (10/16/2022)     Readmission Risk Interventions     No data to display

## 2022-10-19 NOTE — Progress Notes (Addendum)
      [address]       [place] [address]             [phone]         Subjective: Awake and alert, resting in bed. Says he feels well and has no new concerns.   Objective: Vital signs in last 24 hours: Temp:  [98.7 F (37.1 C)-99.7 F (37.6 C)] 99.7 F (37.6 C) (04/01 0353) Pulse Rate:  [75-89] 87 (04/01 0353) Cardiac Rhythm: Normal sinus rhythm (04/01 0700) Resp:  [17-20] 18 (04/01 0353) BP: (122-136)/(54-64) 131/54 (04/01 0353) SpO2:  [95 %-100 %] 96 % (04/01 0353) Weight:  [56.3 kg] 56.3 kg (04/01 0200)     Intake/Output from previous day: 03/31 0701 - 04/01 0700 In: 240 [P.O.:240] Out: 1300 [Urine:1300] Intake/Output this shift: No intake/output data recorded.  General appearance: alert, cooperative, and no distress Neurologic: intact Heart: RRR, monitor showing NSR,  no further atrial fibrillation observed this admission Lungs: normal WOB, breath sounds clear. CXR this am showing trace right effusion / ATX. No pneumothorax,  both lungs well expanded Abdomen: soft, no tenderness, sutures from chest tube exit sites removed.  Extremities: no peripheral edema Wound: the sternotomy incision is healing with no sign of complication.  Lab Results: Recent Labs    10/16/22 2330 10/18/22 0030  WBC 10.0 9.7  HGB 8.9* 8.9*  HCT 27.9* 27.5*  PLT 414* 480*   BMET:  Recent Labs    10/18/22 0030 10/19/22 0033  NA 137 136  K 3.8 4.1  CL 103 101  CO2 24 22  GLUCOSE 108* 109*  BUN 13 13  CREATININE 1.26* 1.10  CALCIUM 8.7* 8.9    PT/INR: No results for input(s): "LABPROT", "INR" in the last 72 hours. ABG    Component Value Date/Time   PHART 7.392 10/05/2022 2023   HCO3 19.1 (L) 10/05/2022 2023   TCO2 20 (L) 10/05/2022 2023   ACIDBASEDEF 5.0 (H) 10/05/2022 2023   O2SAT 100 10/05/2022 2023   CBG (last 3)  No results for input(s): "GLUCAP" in the last 72 hours.  Assessment/Plan:  -Readmission for orthopnea about 2 wks after CABG x  3 by Dr. Cyndia Bent  and found to have a large left pleural effusion. Echo showed good ventricular function and no significant effusion.  He had left ultrasound-guided thoracentesis yesterday yielding 1.1L thin fluid and is feeling much better. He is able to lie flat without any shortness of breath.  OK to resume his apixaban and discharge today from CTS standpoint.  Follow up with repeat CXR scheduled with Dr. Cyndia Bent on 4/17.    LOS: 3 days    Ruben [name] [id number] 10/19/2022   Chart reviewed, patient examined, agree with above. Says his breathing is much better after thoracentesis. He still has a cough which is likely related to atelectasis. Encouraged to continue IS, ambulation. I will followup in office in a couple weeks with CXR.

## 2022-10-19 NOTE — Care Management Important Message (Signed)
Important Message  Patient Details  Name: KIJANI CRILLEY MRN: [id number] Date of Birth: 1945-08-19   Medicare Important Message Given:  Yes     Shelda Altes 10/19/2022, 8:39 AM

## 2022-10-19 NOTE — Progress Notes (Signed)
Discharge instructions (including medications) discussed with patient and wife and copy provided to patient/caregiver

## 2022-10-19 NOTE — Hospital Course (Addendum)
CABG one week ago  3/18  now with complaint of orthopnea  Seen by cards and CTS. Thoracentesis performed for pleural effusion. Plan to follow CTS recs, likley needs to be back on lasix.   Ruben [name] is a 77 y.o. male with medical history significant of  TIA, atrial fibrillation, peptic ulcer disease, hyperlipidemia recent admission 3/16-3/24 for diagnosis of NSTEMI for which he underwent 3vCABG  as well as application of left atrial appendage clip on 10/05/22 by Dr Cyndia Bent. Presents to ED with complaint of sob, orthopnea  and chest discomfort with movement.  Cardiology was consulted.  Subsequently was seen by cardiothoracic surgery.   10/18/2022: Underwent thora with 1.1L out with improvement in orthopnea. CTS follows and will hold diuretic today. Plan for CXR tomorrow morning.   Consultants Cardiology Cardiothoracic surgery  Procedures Successful US guided left thoracentesis. Yielded 1.1 L of bloody fluid. Post procedure chest X-ray reveals small ex-vacuo pneumothorax

## 2022-10-20 DIAGNOSIS — Z7982 Long term (current) use of aspirin: Secondary | ICD-10-CM | POA: Diagnosis not present

## 2022-10-20 DIAGNOSIS — I480 Paroxysmal atrial fibrillation: Secondary | ICD-10-CM | POA: Diagnosis not present

## 2022-10-20 DIAGNOSIS — Z8673 Personal history of transient ischemic attack (TIA), and cerebral infarction without residual deficits: Secondary | ICD-10-CM | POA: Diagnosis not present

## 2022-10-20 DIAGNOSIS — Z48812 Encounter for surgical aftercare following surgery on the circulatory system: Secondary | ICD-10-CM | POA: Diagnosis not present

## 2022-10-20 DIAGNOSIS — E785 Hyperlipidemia, unspecified: Secondary | ICD-10-CM | POA: Diagnosis not present

## 2022-10-20 DIAGNOSIS — Z951 Presence of aortocoronary bypass graft: Secondary | ICD-10-CM | POA: Diagnosis not present

## 2022-10-20 DIAGNOSIS — Z7901 Long term (current) use of anticoagulants: Secondary | ICD-10-CM | POA: Diagnosis not present

## 2022-10-20 DIAGNOSIS — I214 Non-ST elevation (NSTEMI) myocardial infarction: Secondary | ICD-10-CM | POA: Diagnosis not present

## 2022-10-21 MED FILL — Heparin Sodium (Porcine) Inj 1000 Unit/ML: INTRAMUSCULAR | Qty: 20 | Status: AC

## 2022-10-21 MED FILL — Electrolyte-R (PH 7.4) Solution: INTRAVENOUS | Qty: 4000 | Status: AC

## 2022-10-21 MED FILL — Mannitol IV Soln 20%: INTRAVENOUS | Qty: 500 | Status: AC

## 2022-10-21 MED FILL — Sodium Chloride IV Soln 0.9%: INTRAVENOUS | Qty: 2000 | Status: AC

## 2022-10-21 MED FILL — Sodium Bicarbonate IV Soln 8.4%: INTRAVENOUS | Qty: 50 | Status: AC

## 2022-10-21 MED FILL — Lidocaine HCl Local Soln Prefilled Syringe 100 MG/5ML (2%): INTRAMUSCULAR | Qty: 5 | Status: AC

## 2022-10-22 DIAGNOSIS — I214 Non-ST elevation (NSTEMI) myocardial infarction: Secondary | ICD-10-CM | POA: Diagnosis not present

## 2022-10-22 DIAGNOSIS — G459 Transient cerebral ischemic attack, unspecified: Secondary | ICD-10-CM | POA: Diagnosis not present

## 2022-10-22 DIAGNOSIS — I480 Paroxysmal atrial fibrillation: Secondary | ICD-10-CM | POA: Diagnosis not present

## 2022-10-22 DIAGNOSIS — Z23 Encounter for immunization: Secondary | ICD-10-CM | POA: Diagnosis not present

## 2022-10-22 DIAGNOSIS — E7800 Pure hypercholesterolemia, unspecified: Secondary | ICD-10-CM | POA: Diagnosis not present

## 2022-10-22 DIAGNOSIS — I25721 Atherosclerosis of autologous artery coronary artery bypass graft(s) with angina pectoris with documented spasm: Secondary | ICD-10-CM | POA: Diagnosis not present

## 2022-10-23 DIAGNOSIS — Z79899 Other long term (current) drug therapy: Secondary | ICD-10-CM | POA: Diagnosis not present

## 2022-10-23 DIAGNOSIS — Z125 Encounter for screening for malignant neoplasm of prostate: Secondary | ICD-10-CM | POA: Diagnosis not present

## 2022-10-23 DIAGNOSIS — D696 Thrombocytopenia, unspecified: Secondary | ICD-10-CM | POA: Diagnosis not present

## 2022-10-23 DIAGNOSIS — I2510 Atherosclerotic heart disease of native coronary artery without angina pectoris: Secondary | ICD-10-CM | POA: Diagnosis not present

## 2022-10-23 DIAGNOSIS — J90 Pleural effusion, not elsewhere classified: Secondary | ICD-10-CM | POA: Diagnosis not present

## 2022-10-23 DIAGNOSIS — I480 Paroxysmal atrial fibrillation: Secondary | ICD-10-CM | POA: Diagnosis not present

## 2022-10-23 DIAGNOSIS — F5102 Adjustment insomnia: Secondary | ICD-10-CM | POA: Diagnosis not present

## 2022-10-23 DIAGNOSIS — I5033 Acute on chronic diastolic (congestive) heart failure: Secondary | ICD-10-CM | POA: Diagnosis not present

## 2022-10-28 ENCOUNTER — Telehealth: Payer: Self-pay | Admitting: *Deleted

## 2022-10-28 NOTE — Telephone Encounter (Signed)
Mary with [facility] called to report a patient weight of 118lb. Per Corrie Dandy, patient reports not having an appetite. Patient reported eating a good meal yesterday. Family is encouraging PO intake. Advised HHRN to encourage patient to try to snack more often throughout the day if he does not have an appetite for large meals. Advised to increase protein intake. HHRN verbalized understanding. Patient has appt with Dr. Laneta Simmers next week.

## 2022-11-03 ENCOUNTER — Other Ambulatory Visit: Payer: Self-pay | Admitting: Surgical

## 2022-11-03 ENCOUNTER — Other Ambulatory Visit: Payer: Self-pay | Admitting: Surgery

## 2022-11-03 DIAGNOSIS — Z951 Presence of aortocoronary bypass graft: Secondary | ICD-10-CM

## 2022-11-04 ENCOUNTER — Ambulatory Visit
Admission: RE | Admit: 2022-11-04 | Discharge: 2022-11-04 | Disposition: A | Discharge status: Home / Self Care | Payer: PPO | Source: Ambulatory Visit | Attending: Surgery | Admitting: Surgery

## 2022-11-04 ENCOUNTER — Ambulatory Visit (INDEPENDENT_AMBULATORY_CARE_PROVIDER_SITE_OTHER): Payer: Self-pay | Admitting: Surgery

## 2022-11-04 ENCOUNTER — Other Ambulatory Visit: Payer: Self-pay | Admitting: *Deleted

## 2022-11-04 ENCOUNTER — Encounter: Payer: Self-pay | Admitting: Surgery

## 2022-11-04 VITALS — BP 149/75 | HR 72 | Resp 18 | Ht 68.0 in | Wt 121.0 lb

## 2022-11-04 DIAGNOSIS — Z951 Presence of aortocoronary bypass graft: Secondary | ICD-10-CM

## 2022-11-04 DIAGNOSIS — Z9889 Other specified postprocedural states: Secondary | ICD-10-CM | POA: Diagnosis not present

## 2022-11-04 DIAGNOSIS — R918 Other nonspecific abnormal finding of lung field: Secondary | ICD-10-CM | POA: Diagnosis not present

## 2022-11-04 NOTE — Progress Notes (Signed)
HPI: Patient returns for routine postoperative follow-up having undergone coronary bypass graft surgery x 3 and clipping of left atrial appendage on 10/05/2022. The patient's early postoperative recovery while in the hospital was notable for an uncomplicated postoperative course. Since hospital discharge the patient was readmitted with shortness of breath and a large left pleural effusion.  He underwent left thoracentesis on 10/18/2022 removing 1.1 L of pleural fluid.  He had a cough for a while but that is resolved.  His wife is with him today and said that he appears to have turned the corner and feels much better.  His stamina is improving.  He denies any shortness of breath.   Current Outpatient Medications  Medication Sig Dispense Refill   amiodarone (PACERONE) 200 MG tablet Take 1 tablet (200 mg total) by mouth 2 (two) times daily. For 7 days then reduce the dose to 1 tablet (200 mg) twice daily. 60 tablet 2   apixaban (ELIQUIS) 5 MG TABS tablet Take 1 tablet (5 mg total) by mouth 2 (two) times daily. 60 tablet 1   aspirin EC 81 MG tablet Take 1 tablet (81 mg total) by mouth daily. Swallow whole. 30 tablet 12   cholecalciferol (VITAMIN D3) 25 MCG (1000 UNIT) tablet Take 1,000 Units by mouth daily.     metoprolol succinate (TOPROL-XL) 25 MG 24 hr tablet Take 1 tablet (25 mg total) by mouth daily. 30 tablet 1   Polyvinyl Alcohol-Povidone (REFRESH OP) Place 1 drop into both eyes as needed (dry eye).     rosuvastatin (CRESTOR) 20 MG tablet Take 2 tablets (40 mg total) by mouth at bedtime. 60 tablet 11   vitamin B-12 (CYANOCOBALAMIN) 1000 MCG tablet Take 1,000 mcg by mouth daily.     vitamin C (ASCORBIC ACID) 250 MG tablet Take 250 mg by mouth daily.     zolpidem (AMBIEN) 5 MG tablet Take 5 mg by mouth at bedtime as needed for sleep.     Fe Fum-Vit C-Vit B12-FA (TRIGELS-F FORTE) CAPS capsule Take 1 capsule by mouth daily after breakfast. (Patient not taking: Reported on 11/04/2022) 30 capsule 0    No current facility-administered medications for this visit.    Physical Exam: BP (!) 149/75 (BP Location: Left Arm, Patient Position: Sitting)   Pulse 72   Resp 18   Ht  (1.727 m)   Wt 121 lb (54.9 kg)   SpO2 96% Comment: RA  BMI 18.40 kg/m  He looks well. Cardiac exam shows regular rate and rhythm with normal heart sounds.  There is no murmur. Lungs are clear. The chest incision is healing well and the sternum is stable. His leg incisions are healing well and there is no peripheral edema.  Diagnostic Tests:  Narrative & Impression  CLINICAL DATA:  History of prior coronary bypass grafting   EXAM: CHEST - 2 VIEW   COMPARISON:  10/19/2018   FINDINGS: Cardiac shadow is stable. Postsurgical changes are again noted. The lungs are hyperinflated. Stable blunting of the posterior costophrenic angles is noted. No focal infiltrate or sizable effusion is seen. No parenchymal nodules are noted. Cholelithiasis is noted in the right upper quadrant.   IMPRESSION: Stable blunting of the costophrenic angles. No acute abnormality is noted.     Electronically Signed   By: Alcide Clever M.D.   On: 11/04/2022 09:06      Impression:  Overall he is doing well 1 month following his surgery.  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  His home physical therapy is being completed tomorrow and then he will be ready for outpatient cardiac rehab which could start at any time.  He is planning on doing that at [facility].  Plan:  He will continue to follow-up with Dr. Darrold Junker and will return to see me if he has any problems with his incisions.   Alleen Borne, MD [facility] [phone]

## 2022-11-04 NOTE — Progress Notes (Signed)
Ambulatory referral placed to [facility] per Dr. Laneta Simmers.

## 2022-11-12 ENCOUNTER — Encounter: Payer: Self-pay | Admitting: *Deleted

## 2022-11-12 ENCOUNTER — Encounter: Payer: PPO | Attending: Cardiology | Admitting: *Deleted

## 2022-11-12 DIAGNOSIS — Z951 Presence of aortocoronary bypass graft: Secondary | ICD-10-CM

## 2022-11-12 NOTE — Progress Notes (Signed)
Virtual orientation call completed today. he has an appointment on Date: 11/18/2022  for EP eval and gym Orientation.  Documentation of diagnosis can be found in [facility]  Date: 10/03/2022 .

## 2022-11-18 ENCOUNTER — Ambulatory Visit: Payer: PPO | Admitting: Physician Assistant

## 2022-11-18 ENCOUNTER — Encounter: Payer: PPO | Attending: Cardiology

## 2022-11-18 VITALS — Ht 68.5 in | Wt 131.0 lb

## 2022-11-18 DIAGNOSIS — Z951 Presence of aortocoronary bypass graft: Secondary | ICD-10-CM | POA: Diagnosis not present

## 2022-11-18 NOTE — Patient Instructions (Signed)
Patient Instructions  Patient Details  Name: Ruben [name] MRN: [id number] Date of Birth: 1946-01-08 Referring Provider:  Marcina Millard, MD  Below are your personal goals for exercise, nutrition, and risk factors. Our goal is to help you stay on track towards obtaining and maintaining these goals. We will be discussing your progress on these goals with you throughout the program.  Initial Exercise Prescription:  Initial Exercise Prescription - 11/18/22 1500       Date of Initial Exercise RX and Referring Provider   Date 11/18/22    Referring Provider Dr. Marcina Millard, MD      Oxygen   Maintain Oxygen Saturation 88% or higher      Treadmill   MPH 2.1    Grade 0.5    Minutes 15    METs 2.75      Recumbant Bike   Level 2    RPM 50    Watts 17    Minutes 15    METs 2.91      NuStep   Level 3    SPM 80    Minutes 15    METs 2.91      REL-XR   Level 2    Speed 50    Minutes 15    METs 2.91      Prescription Details   Frequency (times per week) 3    Duration Progress to 30 minutes of continuous aerobic without signs/symptoms of physical distress      Intensity   THRR 40-80% of Max Heartrate 95-128    Ratings of Perceived Exertion 11-13    Perceived Dyspnea 0-4      Progression   Progression Continue to progress workloads to maintain intensity without signs/symptoms of physical distress.      Resistance Training   Training Prescription Yes    Weight 4 lb    Reps 10-15             Exercise Goals: Frequency: Be able to perform aerobic exercise two to three times per week in program working toward 2-5 days per week of home exercise.  Intensity: Work with a perceived exertion of 11 (fairly light) - 15 (hard) while following your exercise prescription.  We will make changes to your prescription with you as you progress through the program.   Duration: Be able to do 30 to 45 minutes of continuous aerobic exercise in addition to a 5 minute  warm-up and a 5 minute cool-down routine.   Nutrition Goals: Your personal nutrition goals will be established when you do your nutrition analysis with the dietician.  The following are general nutrition guidelines to follow: Cholesterol < 200mg /day Sodium < 1500mg /day Fiber: Men over 50 yrs - 30 grams per day  Personal Goals:  Personal Goals and Risk Factors at Admission - 11/12/22 1348       Core Components/Risk Factors/Patient Goals on Admission    Weight Management Yes    Intervention Weight Management: Develop a combined nutrition and exercise program designed to reach desired caloric intake, while maintaining appropriate intake of nutrient and fiber, sodium and fats, and appropriate energy expenditure required for the weight goal.;Weight Management: Provide education and appropriate resources to help participant work on and attain dietary goals.    Admit Weight 120 lb (54.4 kg)   lost 8-10 lbs in hospital   Goal Weight: Short Term 122 lb (55.3 kg)    Goal Weight: Long Term 130 lb (59 kg)    Expected Outcomes Short Term:  Continue to assess and modify interventions until short term weight is achieved;Long Term: Adherence to nutrition and physical activity/exercise program aimed toward attainment of established weight goal;Weight Gain: Understanding of general recommendations for a high calorie, high protein meal plan that promotes weight gain by distributing calorie intake throughout the day with the consumption for 4-5 meals, snacks, and/or supplements    Lipids Yes    Intervention Provide education and support for participant on nutrition & aerobic/resistive exercise along with prescribed medications to achieve LDL 70mg , HDL >40mg .    Expected Outcomes Short Term: Participant states understanding of desired cholesterol values and is compliant with medications prescribed. Participant is following exercise prescription and nutrition guidelines.;Long Term: Cholesterol controlled with  medications as prescribed, with individualized exercise RX and with personalized nutrition plan. Value goals: LDL < 70mg , HDL > 40 mg.            Exercise Goals and Review:  Exercise Goals     Row Name 11/18/22 1528             Exercise Goals   Increase Physical Activity Yes       Intervention Provide advice, education, support and counseling about physical activity/exercise needs.;Develop an individualized exercise prescription for aerobic and resistive training based on initial evaluation findings, risk stratification, comorbidities and participant's personal goals.       Expected Outcomes Short Term: Attend rehab on a regular basis to increase amount of physical activity.;Long Term: Add in home exercise to make exercise part of routine and to increase amount of physical activity.;Long Term: Exercising regularly at least 3-5 days a week.       Increase Strength and Stamina Yes       Intervention Provide advice, education, support and counseling about physical activity/exercise needs.;Develop an individualized exercise prescription for aerobic and resistive training based on initial evaluation findings, risk stratification, comorbidities and participant's personal goals.       Expected Outcomes Short Term: Increase workloads from initial exercise prescription for resistance, speed, and METs.;Short Term: Perform resistance training exercises routinely during rehab and add in resistance training at home;Long Term: Improve cardiorespiratory fitness, muscular endurance and strength as measured by increased METs and functional capacity ( )       Able to understand and use rate of perceived exertion (RPE) scale Yes       Intervention Provide education and explanation on how to use RPE scale       Expected Outcomes Short Term: Able to use RPE daily in rehab to express subjective intensity level;Long Term:  Able to use RPE to guide intensity level when exercising independently       Able to  understand and use Dyspnea scale Yes       Intervention Provide education and explanation on how to use Dyspnea scale       Expected Outcomes Short Term: Able to use Dyspnea scale daily in rehab to express subjective sense of shortness of breath during exertion;Long Term: Able to use Dyspnea scale to guide intensity level when exercising independently       Knowledge and understanding of Target Heart Rate Range (THRR) Yes       Intervention Provide education and explanation of THRR including how the numbers were predicted and where they are located for reference       Expected Outcomes Long Term: Able to use THRR to govern intensity when exercising independently;Short Term: Able to use daily as guideline for intensity in rehab;Short Term: Able to state/look up  THRR       Able to check pulse independently Yes       Intervention Review the importance of being able to check your own pulse for safety during independent exercise;Provide education and demonstration on how to check pulse in carotid and radial arteries.       Expected Outcomes Short Term: Able to explain why pulse checking is important during independent exercise;Long Term: Able to check pulse independently and accurately       Understanding of Exercise Prescription Yes       Intervention Provide education, explanation, and written materials on patient's individual exercise prescription       Expected Outcomes Short Term: Able to explain program exercise prescription;Long Term: Able to explain home exercise prescription to exercise independently

## 2022-11-18 NOTE — Progress Notes (Signed)
Cardiac Individual Treatment Plan  Patient Details  Name: ESAUL DORWART MRN: [id number] Date of Birth: Oct 19, 1945 Referring Provider:   Flowsheet Row Cardiac Rehab from 11/18/2022 in [facility] Cardiac and Pulmonary Rehab  Referring Provider Dr. Marcina Millard, MD       Initial Encounter Date:  Flowsheet Row Cardiac Rehab from 11/18/2022 in [facility] Cardiac and Pulmonary Rehab  Date 11/18/22       Visit Diagnosis: S/P CABG x 3  Patient's Home Medications on Admission:  Current Outpatient Medications:    amiodarone (PACERONE) 200 MG tablet, Take 1 tablet (200 mg total) by mouth 2 (two) times daily. For 7 days then reduce the dose to 1 tablet (200 mg) twice daily., Disp: 60 tablet, Rfl: 2   apixaban (ELIQUIS) 5 MG TABS tablet, Take 1 tablet (5 mg total) by mouth 2 (two) times daily., Disp: 60 tablet, Rfl: 1   aspirin EC 81 MG tablet, Take 1 tablet (81 mg total) by mouth daily. Swallow whole., Disp: 30 tablet, Rfl: 12   cholecalciferol (VITAMIN D3) 25 MCG (1000 UNIT) tablet, Take 1,000 Units by mouth daily., Disp: , Rfl:    metoprolol succinate (TOPROL-XL) 25 MG 24 hr tablet, Take 1 tablet (25 mg total) by mouth daily., Disp: 30 tablet, Rfl: 1   Polyvinyl Alcohol-Povidone (REFRESH OP), Place 1 drop into both eyes as needed (dry eye)., Disp: , Rfl:    rosuvastatin (CRESTOR) 20 MG tablet, Take 2 tablets (40 mg total) by mouth at bedtime., Disp: 60 tablet, Rfl: 11   vitamin B-12 (CYANOCOBALAMIN) 1000 MCG tablet, Take 1,000 mcg by mouth daily., Disp: , Rfl:    vitamin C (ASCORBIC ACID) 250 MG tablet, Take 250 mg by mouth daily., Disp: , Rfl:   Past Medical History: Past Medical History:  Diagnosis Date   TIA (transient ischemic attack)     Tobacco Use: Social History   Tobacco Use  Smoking Status Never  Smokeless Tobacco Never    Labs: Review Flowsheet  More data may exist      Latest Ref Rng & Units 11/03/2013 10/29/2020 10/02/2022 10/04/2022 10/05/2022  Labs for ITP Cardiac and  Pulmonary Rehab  Cholestrol 0 - 200 mg/dL 086  578  - - -  LDL (calc) 0 - 99 mg/dL 469  629  - - -  HDL-C >40 mg/dL 31  34  - - -  Trlycerides <150 mg/dL 528  413  - - -  Hemoglobin A1c 4.8 - 5.6 % 5.3  5.1  5.4  5.3  -  PH, Arterial 7.35 - 7.45 - - - 7.43  7.392  7.394  7.452  7.464  7.413   PCO2 arterial 32 - 48 mmHg - - - 39  31.4  33.2  32.7  30.4  38.3   Bicarbonate 20.0 - 28.0 mmol/L - - - 25.9  19.1  20.5  22.8  21.8  25.4  24.4   TCO2 22 - 32 mmol/L - - - - 20  22  24  24  23  26  27  26  29  26    Acid-base deficit 0.0 - 2.0 mmol/L - - - - 5.0  4.0  1.0  2.0   O2 Saturation % - - - 98.1  100  100  100  100  81  100      Exercise Target Goals: Exercise Program Goal: Individual exercise prescription set using results from initial 6 min walk test and THRR while considering  patient's activity barriers  and safety.   Exercise Prescription Goal: Initial exercise prescription builds to 30-45 minutes a day of aerobic activity, 2-3 days per week.  Home exercise guidelines will be given to patient during program as part of exercise prescription that the participant will acknowledge.   Education: Aerobic Exercise: - Group verbal and visual presentation on the components of exercise prescription. Introduces F.I.T.T principle from ACSM for exercise prescriptions.  Reviews F.I.T.T. principles of aerobic exercise including progression. Written material given at graduation. Flowsheet Row Cardiac Rehab from 11/18/2022 in [facility] Cardiac and Pulmonary Rehab  Education need identified 11/18/22       Education: Resistance Exercise: - Group verbal and visual presentation on the components of exercise prescription. Introduces F.I.T.T principle from ACSM for exercise prescriptions  Reviews F.I.T.T. principles of resistance exercise including progression. Written material given at graduation.    Education: Exercise & Equipment Safety: - Individual verbal instruction and demonstration of equipment use  and safety with use of the equipment. Flowsheet Row Cardiac Rehab from 11/18/2022 in [facility] Cardiac and Pulmonary Rehab  Date 11/18/22  Educator NT  Instruction Review Code 1- Verbalizes Understanding       Education: Exercise Physiology & General Exercise Guidelines: - Group verbal and written instruction with models to review the exercise physiology of the cardiovascular system and associated critical values. Provides general exercise guidelines with specific guidelines to those with heart or lung disease.    Education: Flexibility, Balance, Mind/Body Relaxation: - Group verbal and visual presentation with interactive activity on the components of exercise prescription. Introduces F.I.T.T principle from ACSM for exercise prescriptions. Reviews F.I.T.T. principles of flexibility and balance exercise training including progression. Also discusses the mind body connection.  Reviews various relaxation techniques to help reduce and manage stress (i.e. Deep breathing, progressive muscle relaxation, and visualization). Balance handout provided to take home. Written material given at graduation.   Activity Barriers & Risk Stratification:  Activity Barriers & Cardiac Risk Stratification - 11/18/22 1527       Activity Barriers & Cardiac Risk Stratification   Activity Barriers Other (comment)   R leg soreness   Comments R leg soreness    Cardiac Risk Stratification High             6 Minute Walk:  6 Minute Walk     Row Name 11/18/22 1526         6 Minute Walk   Phase Initial     Distance 1220 feet     Walk Time 6 minutes     # of Rest Breaks 0     MPH 2.31     METS 2.91     RPE 12     Perceived Dyspnea  0     VO2 Peak 10.2     Symptoms No     Resting HR 62 bpm     Resting BP 136/68     Resting Oxygen Saturation  99 %     Exercise Oxygen Saturation  during 6 min walk 100 %     Max Ex. HR 84 bpm     Max Ex. BP 144/70     2 Minute Post BP 128/70              Oxygen  Initial Assessment:   Oxygen Re-Evaluation:   Oxygen Discharge (Final Oxygen Re-Evaluation):   Initial Exercise Prescription:  Initial Exercise Prescription - 11/18/22 1500       Date of Initial Exercise RX and Referring Provider   Date 11/18/22  Referring Provider Dr. Marcina Millard, MD      Oxygen   Maintain Oxygen Saturation 88% or higher      Treadmill   MPH 2.1    Grade 0.5    Minutes 15    METs 2.75      Recumbant Bike   Level 2    RPM 50    Watts 17    Minutes 15    METs 2.91      NuStep   Level 3    SPM 80    Minutes 15    METs 2.91      REL-XR   Level 2    Speed 50    Minutes 15    METs 2.91      Prescription Details   Frequency (times per week) 3    Duration Progress to 30 minutes of continuous aerobic without signs/symptoms of physical distress      Intensity   THRR 40-80% of Max Heartrate 95-128    Ratings of Perceived Exertion 11-13    Perceived Dyspnea 0-4      Progression   Progression Continue to progress workloads to maintain intensity without signs/symptoms of physical distress.      Resistance Training   Training Prescription Yes    Weight 4 lb    Reps 10-15             Perform Capillary Blood Glucose checks as needed.  Exercise Prescription Changes:   Exercise Prescription Changes     Row Name 11/18/22 1500             Response to Exercise   Blood Pressure (Admit) 136/68       Blood Pressure (Exercise) 144/70       Blood Pressure (Exit) 128/70       Heart Rate (Admit) 62 bpm       Heart Rate (Exercise) 84 bpm       Heart Rate (Exit) 67 bpm       Oxygen Saturation (Admit) 99 %       Oxygen Saturation (Exercise) 100 %       Rating of Perceived Exertion (Exercise) 12       Perceived Dyspnea (Exercise) 0       Symptoms none       Comments Results                Exercise Comments:   Exercise Goals and Review:   Exercise Goals     Row Name 11/18/22 1528             Exercise Goals    Increase Physical Activity Yes       Intervention Provide advice, education, support and counseling about physical activity/exercise needs.;Develop an individualized exercise prescription for aerobic and resistive training based on initial evaluation findings, risk stratification, comorbidities and participant's personal goals.       Expected Outcomes Short Term: Attend rehab on a regular basis to increase amount of physical activity.;Long Term: Add in home exercise to make exercise part of routine and to increase amount of physical activity.;Long Term: Exercising regularly at least 3-5 days a week.       Increase Strength and Stamina Yes       Intervention Provide advice, education, support and counseling about physical activity/exercise needs.;Develop an individualized exercise prescription for aerobic and resistive training based on initial evaluation findings, risk stratification, comorbidities and participant's personal goals.       Expected Outcomes Short  Term: Increase workloads from initial exercise prescription for resistance, speed, and METs.;Short Term: Perform resistance training exercises routinely during rehab and add in resistance training at home;Long Term: Improve cardiorespiratory fitness, muscular endurance and strength as measured by increased METs and functional capacity ( )       Able to understand and use rate of perceived exertion (RPE) scale Yes       Intervention Provide education and explanation on how to use RPE scale       Expected Outcomes Short Term: Able to use RPE daily in rehab to express subjective intensity level;Long Term:  Able to use RPE to guide intensity level when exercising independently       Able to understand and use Dyspnea scale Yes       Intervention Provide education and explanation on how to use Dyspnea scale       Expected Outcomes Short Term: Able to use Dyspnea scale daily in rehab to express subjective sense of shortness of breath during  exertion;Long Term: Able to use Dyspnea scale to guide intensity level when exercising independently       Knowledge and understanding of Target Heart Rate Range (THRR) Yes       Intervention Provide education and explanation of THRR including how the numbers were predicted and where they are located for reference       Expected Outcomes Long Term: Able to use THRR to govern intensity when exercising independently;Short Term: Able to use daily as guideline for intensity in rehab;Short Term: Able to state/look up THRR       Able to check pulse independently Yes       Intervention Review the importance of being able to check your own pulse for safety during independent exercise;Provide education and demonstration on how to check pulse in carotid and radial arteries.       Expected Outcomes Short Term: Able to explain why pulse checking is important during independent exercise;Long Term: Able to check pulse independently and accurately       Understanding of Exercise Prescription Yes       Intervention Provide education, explanation, and written materials on patient's individual exercise prescription       Expected Outcomes Short Term: Able to explain program exercise prescription;Long Term: Able to explain home exercise prescription to exercise independently                Exercise Goals Re-Evaluation :   Discharge Exercise Prescription (Final Exercise Prescription Changes):  Exercise Prescription Changes - 11/18/22 1500       Response to Exercise   Blood Pressure (Admit) 136/68    Blood Pressure (Exercise) 144/70    Blood Pressure (Exit) 128/70    Heart Rate (Admit) 62 bpm    Heart Rate (Exercise) 84 bpm    Heart Rate (Exit) 67 bpm    Oxygen Saturation (Admit) 99 %    Oxygen Saturation (Exercise) 100 %    Rating of Perceived Exertion (Exercise) 12    Perceived Dyspnea (Exercise) 0    Symptoms none    Comments Results             Nutrition:  Target Goals: Understanding  of nutrition guidelines, daily intake of sodium 1500mg , cholesterol 200mg , calories 30% from fat and 7% or less from saturated fats, daily to have 5 or more servings of fruits and vegetables.  Education: All About Nutrition: -Group instruction provided by verbal, written material, interactive activities, discussions, models, and posters to present general  guidelines for heart healthy nutrition including fat, fiber, MyPlate, the role of sodium in heart healthy nutrition, utilization of the nutrition label, and utilization of this knowledge for meal planning. Follow up email sent as well. Written material given at graduation.   Biometrics:  Pre Biometrics - 11/18/22 1528       Pre Biometrics   Height 5' 8.5" (1.74 m)    Weight 131 lb (59.4 kg)    Waist Circumference 30 inches    Hip Circumference 34 inches    Waist to Hip Ratio 0.88 %    BMI (Calculated) 19.63    Single Leg Stand 30 seconds              Nutrition Therapy Plan and Nutrition Goals:  Nutrition Therapy & Goals - 11/18/22 1516       Intervention Plan   Intervention Prescribe, educate and counsel regarding individualized specific dietary modifications aiming towards targeted core components such as weight, hypertension, lipid management, diabetes, heart failure and other comorbidities.    Expected Outcomes Short Term Goal: Understand basic principles of dietary content, such as calories, fat, sodium, cholesterol and nutrients.;Short Term Goal: A plan has been developed with personal nutrition goals set during dietitian appointment.;Long Term Goal: Adherence to prescribed nutrition plan.             Nutrition Assessments:  MEDIFICTS Score Key: ?70 Need to make dietary changes  40-70 Heart Healthy Diet ? 40 Therapeutic Level Cholesterol Diet  Flowsheet Row Cardiac Rehab from 11/18/2022 in [facility] Cardiac and Pulmonary Rehab  Picture Your Plate Total Score on Admission 82      Picture Your Plate Scores: <47  Unhealthy dietary pattern with much room for improvement. 41-50 Dietary pattern unlikely to meet recommendations for good health and room for improvement. 51-60 More healthful dietary pattern, with some room for improvement.  >60 Healthy dietary pattern, although there may be some specific behaviors that could be improved.    Nutrition Goals Re-Evaluation:   Nutrition Goals Discharge (Final Nutrition Goals Re-Evaluation):   Psychosocial: Target Goals: Acknowledge presence or absence of significant depression and/or stress, maximize coping skills, provide positive support system. Participant is able to verbalize types and ability to use techniques and skills needed for reducing stress and depression.   Education: Stress, Anxiety, and Depression - Group verbal and visual presentation to define topics covered.  Reviews how body is impacted by stress, anxiety, and depression.  Also discusses healthy ways to reduce stress and to treat/manage anxiety and depression.  Written material given at graduation.   Education: Sleep Hygiene -Provides group verbal and written instruction about how sleep can affect your health.  Define sleep hygiene, discuss sleep cycles and impact of sleep habits. Review good sleep hygiene tips.    Initial Review & Psychosocial Screening:  Initial Psych Review & Screening - 11/12/22 1340       Initial Review   Current issues with None Identified      Family Dynamics   Good Support System? Yes   wife Boyd Kerbs, daughter Leotis Shames,  friends     Barriers   Psychosocial barriers to participate in program There are no identifiable barriers or psychosocial needs.      Screening Interventions   Interventions Encouraged to exercise;To provide support and resources with identified psychosocial needs;Provide feedback about the scores to participant    Expected Outcomes Short Term goal: Utilizing psychosocial counselor, staff and physician to assist with identification of  specific Stressors or current issues interfering with  healing process. Setting desired goal for each stressor or current issue identified.;Long Term Goal: Stressors or current issues are controlled or eliminated.;Short Term goal: Identification and review with participant of any Quality of Life or Depression concerns found by scoring the questionnaire.;Long Term goal: The participant improves quality of Life and PHQ9 Scores as seen by post scores and/or verbalization of changes             Quality of Life Scores:   Quality of Life - 11/18/22 1515       Quality of Life   Select Quality of Life      Quality of Life Scores   Health/Function Pre 27.5 %    Socioeconomic Pre 29.14 %    Psych/Spiritual Pre 30 %    Family Pre 25.2 %    GLOBAL Pre 28.01 %            Scores of 19 and below usually indicate a poorer quality of life in these areas.  A difference of  2-3 points is a clinically meaningful difference.  A difference of 2-3 points in the total score of the Quality of Life Index has been associated with significant improvement in overall quality of life, self-image, physical symptoms, and general health in studies assessing change in quality of life.  PHQ-9: Review Flowsheet       11/18/2022 05/13/2017  Depression screen PHQ 2/9  Decreased Interest 0 0  Down, Depressed, Hopeless 0 0  PHQ - 2 Score 0 0  Altered sleeping 0 -  Tired, decreased energy 1 -  Change in appetite 0 -  Feeling bad or failure about yourself  0 -  Trouble concentrating 0 -  Moving slowly or fidgety/restless 0 -  Suicidal thoughts 0 -  PHQ-9 Score 1 -  Difficult doing work/chores Not difficult at all -   Interpretation of Total Score  Total Score Depression Severity:  1-4 = Minimal depression, 5-9 = Mild depression, 10-14 = Moderate depression, 15-19 = Moderately severe depression, 20-27 = Severe depression   Psychosocial Evaluation and Intervention:  Psychosocial Evaluation - 11/12/22 1400        Psychosocial Evaluation & Interventions   Interventions Encouraged to exercise with the program and follow exercise prescription    Comments Billey Gosling has no barriers to attending the program.  He wants to improve his strength upper and lower body. He has lost 8-10 lbs and wants to gain some back. He lives with his wife,Penny. She and their daughter Leotis Shames are his support as well as friends are being helpful. He is ready to start the program.    Expected Outcomes STG  attends all scheduled sessions and is able to workon exercise progression. LTG COntinues to work on his exercise progression after discharge.    Continue Psychosocial Services  Follow up required by staff             Psychosocial Re-Evaluation:   Psychosocial Discharge (Final Psychosocial Re-Evaluation):   Vocational Rehabilitation: Provide vocational rehab assistance to qualifying candidates.   Vocational Rehab Evaluation & Intervention:  Vocational Rehab - 11/12/22 1353       Initial Vocational Rehab Evaluation & Intervention   Assessment shows need for Vocational Rehabilitation No      Vocational Rehab Re-Evaulation   Comments retired      Discharge Vocational Rehab   Discharge Vocational Rehabilitation retired             Education: Education Goals: Education classes will be provided on a  variety of topics geared toward better understanding of heart health and risk factor modification. Participant will state understanding/return demonstration of topics presented as noted by education test scores.  Learning Barriers/Preferences:  Learning Barriers/Preferences - 11/12/22 1346       Learning Barriers/Preferences   Learning Barriers Hearing   has high pitch ringing in his ear, may dull hearing   Learning Preferences Video;Written Material;Individual Instruction             General Cardiac Education Topics:  AED/CPR: - Group verbal and written instruction with the use of models to demonstrate  the basic use of the AED with the basic ABC's of resuscitation.   Anatomy and Cardiac Procedures: - Group verbal and visual presentation and models provide information about basic cardiac anatomy and function. Reviews the testing methods done to diagnose heart disease and the outcomes of the test results. Describes the treatment choices: Medical Management, Angioplasty, or Coronary Bypass Surgery for treating various heart conditions including Myocardial Infarction, Angina, Valve Disease, and Cardiac Arrhythmias.  Written material given at graduation. Flowsheet Row Cardiac Rehab from 11/18/2022 in [facility] Cardiac and Pulmonary Rehab  Education need identified 11/18/22       Medication Safety: - Group verbal and visual instruction to review commonly prescribed medications for heart and lung disease. Reviews the medication, class of the drug, and side effects. Includes the steps to properly store meds and maintain the prescription regimen.  Written material given at graduation.   Intimacy: - Group verbal instruction through game format to discuss how heart and lung disease can affect sexual intimacy. Written material given at graduation..   Know Your Numbers and Heart Failure: - Group verbal and visual instruction to discuss disease risk factors for cardiac and pulmonary disease and treatment options.  Reviews associated critical values for Overweight/Obesity, Hypertension, Cholesterol, and Diabetes.  Discusses basics of heart failure: signs/symptoms and treatments.  Introduces Heart Failure Zone chart for action plan for heart failure.  Written material given at graduation.   Infection Prevention: - Provides verbal and written material to individual with discussion of infection control including proper hand washing and proper equipment cleaning during exercise session. Flowsheet Row Cardiac Rehab from 11/18/2022 in [facility] Cardiac and Pulmonary Rehab  Date 11/18/22  Educator NT  Instruction Review  Code 1- Verbalizes Understanding       Falls Prevention: - Provides verbal and written material to individual with discussion of falls prevention and safety. Flowsheet Row Cardiac Rehab from 11/18/2022 in [facility] Cardiac and Pulmonary Rehab  Date 11/12/22  Educator SB  Instruction Review Code 1- Verbalizes Understanding       Other: -Provides group and verbal instruction on various topics (see comments)   Knowledge Questionnaire Score:  Knowledge Questionnaire Score - 11/18/22 1516       Knowledge Questionnaire Score   Pre Score 23/26             Core Components/Risk Factors/Patient Goals at Admission:  Personal Goals and Risk Factors at Admission - 11/12/22 1348       Core Components/Risk Factors/Patient Goals on Admission    Weight Management Yes    Intervention Weight Management: Develop a combined nutrition and exercise program designed to reach desired caloric intake, while maintaining appropriate intake of nutrient and fiber, sodium and fats, and appropriate energy expenditure required for the weight goal.;Weight Management: Provide education and appropriate resources to help participant work on and attain dietary goals.    Admit Weight 120 lb (54.4 kg)   lost  8-10 lbs in hospital   Goal Weight: Short Term 122 lb (55.3 kg)    Goal Weight: Long Term 130 lb (59 kg)    Expected Outcomes Short Term: Continue to assess and modify interventions until short term weight is achieved;Long Term: Adherence to nutrition and physical activity/exercise program aimed toward attainment of established weight goal;Weight Gain: Understanding of general recommendations for a high calorie, high protein meal plan that promotes weight gain by distributing calorie intake throughout the day with the consumption for 4-5 meals, snacks, and/or supplements    Lipids Yes    Intervention Provide education and support for participant on nutrition & aerobic/resistive exercise along with prescribed  medications to achieve LDL 70mg , HDL >40mg .    Expected Outcomes Short Term: Participant states understanding of desired cholesterol values and is compliant with medications prescribed. Participant is following exercise prescription and nutrition guidelines.;Long Term: Cholesterol controlled with medications as prescribed, with individualized exercise RX and with personalized nutrition plan. Value goals: LDL < 70mg , HDL > 40 mg.             Education:Diabetes - Individual verbal and written instruction to review signs/symptoms of diabetes, desired ranges of glucose level fasting, after meals and with exercise. Acknowledge that pre and post exercise glucose checks will be done for 3 sessions at entry of program.   Core Components/Risk Factors/Patient Goals Review:    Core Components/Risk Factors/Patient Goals at Discharge (Final Review):    ITP Comments:  ITP Comments     Row Name 11/12/22 1404 11/18/22 1506         ITP Comments Virtual orientation call completed today. he has an appointment on Date: 11/18/2022  for EP eval and gym Orientation.  Documentation of diagnosis can be found in [facility]  Date: 10/03/2022 . Completed and gym orientation. Initial ITP created and sent for review to Dr. Bethann Punches, Medical Director.               Comments: Initial ITP

## 2022-11-19 ENCOUNTER — Encounter: Payer: PPO | Admitting: *Deleted

## 2022-11-19 DIAGNOSIS — Z951 Presence of aortocoronary bypass graft: Secondary | ICD-10-CM | POA: Diagnosis not present

## 2022-11-19 NOTE — Progress Notes (Signed)
Daily Session Note  Patient Details  Name: Ruben [name] MRN: [id number] Date of Birth: 1946-02-02 Referring Provider:   Flowsheet Row Cardiac Rehab from 11/18/2022 in [facility]  Referring Provider Dr. Marcina Millard, MD       Encounter Date: 11/19/2022  Check In:  Session Check In - 11/19/22 1340       Check-In   Supervising physician immediately available to respond to emergencies See telemetry face sheet for immediately available ER MD    Location [facility]-Cardiac & Pulmonary Rehab    Staff Present Susann Givens, RN BSN;Jessica Juanetta Gosling, MA, RCEP, CCRP, CCET;Noah Tickle, BS, Exercise Physiologist    Virtual Visit No    Medication changes reported     No    Fall or balance concerns reported    No    Warm-up and Cool-down Performed on first and last piece of equipment    Resistance Training Performed Yes    VAD Patient? No    PAD/SET Patient? No      Pain Assessment   Currently in Pain? No/denies                Social History   Tobacco Use  Smoking Status Never  Smokeless Tobacco Never    Goals Met:  Independence with exercise equipment Exercise tolerated well No report of concerns or symptoms today Strength training completed today  Goals Unmet:  Not Applicable  Comments: First full day of exercise!  Patient was oriented to gym and equipment including functions, settings, policies, and procedures.  Patient's individual exercise prescription and treatment plan were reviewed.  All starting workloads were established based on the results of the 6 minute walk test done at initial orientation visit.  The plan for exercise progression was also introduced and progression will be customized based on patient's performance and goals.     Dr. Bethann Punches is Medical Director for [facility] Cardiac Rehabilitation.  Dr. Vida Rigger is Medical Director for [facility] Pulmonary Rehabilitation.

## 2022-11-23 ENCOUNTER — Encounter: Payer: PPO | Admitting: *Deleted

## 2022-11-23 DIAGNOSIS — Z951 Presence of aortocoronary bypass graft: Secondary | ICD-10-CM | POA: Diagnosis not present

## 2022-11-23 NOTE — Progress Notes (Signed)
Daily Session Note  Patient Details  Name: Ruben [name] MRN: [id number] Date of Birth: January 14, 1946 Referring Provider:   Flowsheet Row Cardiac Rehab from 11/18/2022 in [facility]iac and Pulmonary Rehab  Referring Provider Dr. Marcina Millard, MD       Encounter Date: 11/23/2022  Check In:  Session Check In - 11/23/22 1343       Check-In   Supervising physician immediately available to respond to emergencies See telemetry face sheet for immediately available ER MD    Location [facility]    Staff Present Susann Givens, RN BSN;Joseph Reino Kent, Guinevere Ferrari, RN, [place]    Virtual Visit No    Medication changes reported     No    Fall or balance concerns reported    No    Warm-up and Cool-down Performed on first and last piece of equipment    Resistance Training Performed Yes    VAD Patient? No    PAD/SET Patient? No      Pain Assessment   Currently in Pain? No/denies                Social History   Tobacco Use  Smoking Status Never  Smokeless Tobacco Never    Goals Met:  Independence with exercise equipment Exercise tolerated well No report of concerns or symptoms today Strength training completed today  Goals Unmet:  Not Applicable  Comments: Pt able to follow exercise prescription today without complaint.  Will continue to monitor for progression.    Dr. Bethann Punches is Medical Director for [facility] Cardiac Rehabilitation.  Dr. Vida Rigger is Medical Director for [facility] Pulmonary Rehabilitation.

## 2022-11-25 ENCOUNTER — Encounter: Payer: PPO | Admitting: *Deleted

## 2022-11-25 DIAGNOSIS — Z951 Presence of aortocoronary bypass graft: Secondary | ICD-10-CM | POA: Diagnosis not present

## 2022-11-25 NOTE — Progress Notes (Signed)
Daily Session Note  Patient Details  Name: Ruben [name] MRN: [id number] Date of Birth: Jun 07, 1946 Referring Provider:   Flowsheet Row Cardiac Rehab from 11/18/2022 in [facility] Cardiac and Pulmonary Rehab  Referring Provider Dr. Marcina Millard, MD       Encounter Date: 11/25/2022  Check In:  Session Check In - 11/25/22 1346       Check-In   Supervising physician immediately available to respond to emergencies See telemetry face sheet for immediately available ER MD    Location [facility]iac [facility] Pul[facility] Re[facility]    Staff Present Susann Givens, RN BSN;Joseph [place], RCP,RRT,BSRT;Kelly [place], [place], ACSM CEP, Exercise Physiologist    Virtual Visit No    Medication changes reported     No    Fall or balance concerns reported    No    Warm-up and Cool-down Performed on first and last piece of equipment    Resistance Training Performed Yes    VAD Patient? No    PAD/SET Patient? No      Pain Assessment   Currently in Pain? No/denies                Social History   Tobacco Use  Smoking Status Never  Smokeless Tobacco Never    Goals Met:  Independence with exercise equipment Exercise tolerated well No report of concerns or symptoms today Strength training completed today  Goals Unmet:  Not Applicable  Comments: Pt able to follow exercise prescription today without complaint.  Will continue to monitor for progression.    Dr. Bethann Punches is Medical Director for [facility] Cardiac Rehabilitation.  Dr. Vida Rigger is Medical Director for [facility] Pulmonary Rehabilitation.

## 2022-11-26 ENCOUNTER — Encounter: Payer: PPO | Admitting: *Deleted

## 2022-11-26 DIAGNOSIS — Z951 Presence of aortocoronary bypass graft: Secondary | ICD-10-CM | POA: Diagnosis not present

## 2022-11-26 NOTE — Progress Notes (Signed)
Daily Session Note  Patient Details  Name: Ruben [name] MRN: [id number] Date of Birth: [birth year] Referring Provider:   Flowsheet Row Cardiac Rehab from 11/18/2022 in [facility] Cardiac and Pulmonary Rehab  Referring Provider Dr. Marcina Millard, MD       Encounter Date: 11/26/2022  Check In:  Session Check In - 11/26/22 1350       Check-In   Supervising physician immediately available to respond to emergencies See telemetry face sheet for immediately available ER MD    Location [facility]-Cardiac & Pulmonary Rehab    Staff Present Lanny Hurst, RN, ADN;Jessica Juanetta Gosling, MA, RCEP, CCRP, CCET;Joseph [place], [place]    Virtual Visit No    Medication changes reported     No    Fall or balance concerns reported    No    Warm-up and Cool-down Performed on first and last piece of equipment    Resistance Training Performed Yes    VAD Patient? No    PAD/SET Patient? No      Pain Assessment   Currently in Pain? No/denies                Social History   Tobacco Use  Smoking Status Never  Smokeless Tobacco Never    Goals Met:  Independence with exercise equipment Exercise tolerated well No report of concerns or symptoms today Strength training completed today  Goals Unmet:  Not Applicable  Comments: Pt able to follow exercise prescription today without complaint.  Will continue to monitor for progression.    Dr. Bethann Punches is Medical Director for [facility] Cardiac Rehabilitation.  Dr. Vida Rigger is Medical Director for [facility] Pulmonary Rehabilitation.

## 2022-11-30 ENCOUNTER — Encounter: Payer: PPO | Admitting: *Deleted

## 2022-11-30 DIAGNOSIS — Z951 Presence of aortocoronary bypass graft: Secondary | ICD-10-CM | POA: Diagnosis not present

## 2022-11-30 NOTE — Progress Notes (Signed)
Daily Session Note  Patient Details  Name: ANUSH UDY MRN: [id number] Date of Birth: [birth year] Referring Provider:   Flowsheet Row Cardiac Rehab from 11/18/2022 in [facility] Cardiac and Pulmonary Rehab  Referring Provider Dr. Marcina Millard, MD       Encounter Date: 11/30/2022  Check In:  Session Check In - 11/30/22 1343       Check-In   Supervising physician immediately available to respond to emergencies See telemetry face sheet for immediately available ER MD    Location [facility]-Cardiac & Pulmonary Rehab    Staff Present Susann Givens, RN BSN;Joseph Reino Kent, Guinevere Ferrari, RN, [place]    Virtual Visit No    Medication changes reported     No    Fall or balance concerns reported    No    Warm-up and Cool-down Performed on first and last piece of equipment    Resistance Training Performed Yes    VAD Patient? No    PAD/SET Patient? No      Pain Assessment   Currently in Pain? No/denies                Social History   Tobacco Use  Smoking Status Never  Smokeless Tobacco Never    Goals Met:  Independence with exercise equipment Exercise tolerated well No report of concerns or symptoms today Strength training completed today  Goals Unmet:  Not Applicable  Comments: Pt able to follow exercise prescription today without complaint.  Will continue to monitor for progression.    Dr. Bethann Punches is Medical Director for [facility] Cardiac Rehabilitation.  Dr. Vida Rigger is Medical Director for [facility] Pulmonary Rehabilitation.

## 2022-12-02 ENCOUNTER — Encounter: Payer: PPO | Admitting: *Deleted

## 2022-12-02 ENCOUNTER — Encounter: Payer: Self-pay | Admitting: *Deleted

## 2022-12-02 DIAGNOSIS — Z951 Presence of aortocoronary bypass graft: Secondary | ICD-10-CM

## 2022-12-02 NOTE — Progress Notes (Signed)
Cardiac Individual Treatment Plan  Patient Details  Name: Ruben [name] MRN: [id number] Date of Birth: Oct 19, 1945 Referring Provider:   Flowsheet Row Cardiac Rehab from 11/18/2022 in [facility] Cardiac and Pulmonary Rehab  Referring Provider Dr. Marcina Millard, MD       Initial Encounter Date:  Flowsheet Row Cardiac Rehab from 11/18/2022 in [facility] Cardiac and Pulmonary Rehab  Date 11/18/22       Visit Diagnosis: S/P CABG x 3  Patient's Home Medications on Admission:  Current Outpatient Medications:    amiodarone (PACERONE) 200 MG tablet, Take 1 tablet (200 mg total) by mouth 2 (two) times daily. For 7 days then reduce the dose to 1 tablet (200 mg) twice daily., Disp: 60 tablet, Rfl: 2   apixaban (ELIQUIS) 5 MG TABS tablet, Take 1 tablet (5 mg total) by mouth 2 (two) times daily., Disp: 60 tablet, Rfl: 1   aspirin EC 81 MG tablet, Take 1 tablet (81 mg total) by mouth daily. Swallow whole., Disp: 30 tablet, Rfl: 12   cholecalciferol (VITAMIN D3) 25 MCG (1000 UNIT) tablet, Take 1,000 Units by mouth daily., Disp: , Rfl:    metoprolol succinate (TOPROL-XL) 25 MG 24 hr tablet, Take 1 tablet (25 mg total) by mouth daily., Disp: 30 tablet, Rfl: 1   Polyvinyl Alcohol-Povidone (REFRESH OP), Place 1 drop into both eyes as needed (dry eye)., Disp: , Rfl:    rosuvastatin (CRESTOR) 20 MG tablet, Take 2 tablets (40 mg total) by mouth at bedtime., Disp: 60 tablet, Rfl: 11   vitamin B-12 (CYANOCOBALAMIN) 1000 MCG tablet, Take 1,000 mcg by mouth daily., Disp: , Rfl:    vitamin C (ASCORBIC ACID) 250 MG tablet, Take 250 mg by mouth daily., Disp: , Rfl:   Past Medical History: Past Medical History:  Diagnosis Date   TIA (transient ischemic attack)     Tobacco Use: Social History   Tobacco Use  Smoking Status Never  Smokeless Tobacco Never    Labs: Review Flowsheet  More data may exist      Latest Ref Rng & Units 11/03/2013 10/29/2020 10/02/2022 10/04/2022 10/05/2022  Labs for ITP Cardiac and  Pulmonary Rehab  Cholestrol 0 - 200 mg/dL 086  578  - - -  LDL (calc) 0 - 99 mg/dL 469  629  - - -  HDL-C >40 mg/dL 31  34  - - -  Trlycerides <150 mg/dL 528  413  - - -  Hemoglobin A1c 4.8 - 5.6 % 5.3  5.1  5.4  5.3  -  PH, Arterial 7.35 - 7.45 - - - 7.43  7.392  7.394  7.452  7.464  7.413   PCO2 arterial 32 - 48 mmHg - - - 39  31.4  33.2  32.7  30.4  38.3   Bicarbonate 20.0 - 28.0 mmol/L - - - 25.9  19.1  20.5  22.8  21.8  25.4  24.4   TCO2 22 - 32 mmol/L - - - - 20  22  24  24  23  26  27  26  29  26    Acid-base deficit 0.0 - 2.0 mmol/L - - - - 5.0  4.0  1.0  2.0   O2 Saturation % - - - 98.1  100  100  100  100  81  100      Exercise Target Goals: Exercise Program Goal: Individual exercise prescription set using results from initial 6 min walk test and THRR while considering  patient's activity barriers  and safety.   Exercise Prescription Goal: Initial exercise prescription builds to 30-45 minutes a day of aerobic activity, 2-3 days per week.  Home exercise guidelines will be given to patient during program as part of exercise prescription that the participant will acknowledge.   Education: Aerobic Exercise: - Group verbal and visual presentation on the components of exercise prescription. Introduces F.I.T.T principle from ACSM for exercise prescriptions.  Reviews F.I.T.T. principles of aerobic exercise including progression. Written material given at graduation. Flowsheet Row Cardiac Rehab from 11/25/2022 in [facility] Cardiac and Pulmonary Rehab  Education need identified 11/18/22       Education: Resistance Exercise: - Group verbal and visual presentation on the components of exercise prescription. Introduces F.I.T.T principle from ACSM for exercise prescriptions  Reviews F.I.T.T. principles of resistance exercise including progression. Written material given at graduation.    Education: Exercise & Equipment Safety: - Individual verbal instruction and demonstration of equipment use  and safety with use of the equipment. Flowsheet Row Cardiac Rehab from 11/25/2022 in [facility] Cardiac and Pulmonary Rehab  Date 11/18/22  Educator NT  Instruction Review Code 1- Verbalizes Understanding       Education: Exercise Physiology & General Exercise Guidelines: - Group verbal and written instruction with models to review the exercise physiology of the cardiovascular system and associated critical values. Provides general exercise guidelines with specific guidelines to those with heart or lung disease.    Education: Flexibility, Balance, Mind/Body Relaxation: - Group verbal and visual presentation with interactive activity on the components of exercise prescription. Introduces F.I.T.T principle from ACSM for exercise prescriptions. Reviews F.I.T.T. principles of flexibility and balance exercise training including progression. Also discusses the mind body connection.  Reviews various relaxation techniques to help reduce and manage stress (i.e. Deep breathing, progressive muscle relaxation, and visualization). Balance handout provided to take home. Written material given at graduation.   Activity Barriers & Risk Stratification:  Activity Barriers & Cardiac Risk Stratification - 11/18/22 1527       Activity Barriers & Cardiac Risk Stratification   Activity Barriers Other (comment)   R leg soreness   Comments R leg soreness    Cardiac Risk Stratification High             6 Minute Walk:  6 Minute Walk     Row Name 11/18/22 1526         6 Minute Walk   Phase Initial     Distance 1220 feet     Walk Time 6 minutes     # of Rest Breaks 0     MPH 2.31     METS 2.91     RPE 12     Perceived Dyspnea  0     VO2 Peak 10.2     Symptoms No     Resting HR 62 bpm     Resting BP 136/68     Resting Oxygen Saturation  99 %     Exercise Oxygen Saturation  during 6 min walk 100 %     Max Ex. HR 84 bpm     Max Ex. BP 144/70     2 Minute Post BP 128/70              Oxygen  Initial Assessment:   Oxygen Re-Evaluation:   Oxygen Discharge (Final Oxygen Re-Evaluation):   Initial Exercise Prescription:  Initial Exercise Prescription - 11/18/22 1500       Date of Initial Exercise RX and Referring Provider   Date 11/18/22  Referring Provider Dr. Marcina Millard, MD      Oxygen   Maintain Oxygen Saturation 88% or higher      Treadmill   MPH 2.1    Grade 0.5    Minutes 15    METs 2.75      Recumbant Bike   Level 2    RPM 50    Watts 17    Minutes 15    METs 2.91      NuStep   Level 3    SPM 80    Minutes 15    METs 2.91      REL-XR   Level 2    Speed 50    Minutes 15    METs 2.91      Prescription Details   Frequency (times per week) 3    Duration Progress to 30 minutes of continuous aerobic without signs/symptoms of physical distress      Intensity   THRR 40-80% of Max Heartrate 95-128    Ratings of Perceived Exertion 11-13    Perceived Dyspnea 0-4      Progression   Progression Continue to progress workloads to maintain intensity without signs/symptoms of physical distress.      Resistance Training   Training Prescription Yes    Weight 4 lb    Reps 10-15             Perform Capillary Blood Glucose checks as needed.  Exercise Prescription Changes:   Exercise Prescription Changes     Row Name 11/18/22 1500 11/24/22 0800           Response to Exercise   Blood Pressure (Admit) 136/68 106/64      Blood Pressure (Exercise) 144/70 150/74      Blood Pressure (Exit) 128/70 124/64      Heart Rate (Admit) 62 bpm 56 bpm      Heart Rate (Exercise) 84 bpm 97 bpm      Heart Rate (Exit) 67 bpm 70 bpm      Oxygen Saturation (Admit) 99 % --      Oxygen Saturation (Exercise) 100 % --      Rating of Perceived Exertion (Exercise) 12 13      Perceived Dyspnea (Exercise) 0 --      Symptoms none none      Comments Results 1st full day of exercise      Duration -- Continue with 30 min of aerobic exercise without  signs/symptoms of physical distress.      Intensity -- THRR unchanged        Progression   Progression -- Continue to progress workloads to maintain intensity without signs/symptoms of physical distress.      Average METs -- 2.45        Resistance Training   Training Prescription -- Yes      Weight -- 4 lb      Reps -- 10-15        Interval Training   Interval Training -- No        Biostep-RELP   Level -- 2      Minutes -- 15      METs -- 2        Track   Laps -- 35      Minutes -- 15      METs -- 2.9        Oxygen   Maintain Oxygen Saturation -- 88% or higher  Exercise Comments:   Exercise Comments     Row Name 11/19/22 1341           Exercise Comments First full day of exercise!  Patient was oriented to gym and equipment including functions, settings, policies, and procedures.  Patient's individual exercise prescription and treatment plan were reviewed.  All starting workloads were established based on the results of the 6 minute walk test done at initial orientation visit.  The plan for exercise progression was also introduced and progression will be customized based on patient's performance and goals.                Exercise Goals and Review:   Exercise Goals     Row Name 11/18/22 1528             Exercise Goals   Increase Physical Activity Yes       Intervention Provide advice, education, support and counseling about physical activity/exercise needs.;Develop an individualized exercise prescription for aerobic and resistive training based on initial evaluation findings, risk stratification, comorbidities and participant's personal goals.       Expected Outcomes Short Term: Attend rehab on a regular basis to increase amount of physical activity.;Long Term: Add in home exercise to make exercise part of routine and to increase amount of physical activity.;Long Term: Exercising regularly at least 3-5 days a week.       Increase Strength  and Stamina Yes       Intervention Provide advice, education, support and counseling about physical activity/exercise needs.;Develop an individualized exercise prescription for aerobic and resistive training based on initial evaluation findings, risk stratification, comorbidities and participant's personal goals.       Expected Outcomes Short Term: Increase workloads from initial exercise prescription for resistance, speed, and METs.;Short Term: Perform resistance training exercises routinely during rehab and add in resistance training at home;Long Term: Improve cardiorespiratory fitness, muscular endurance and strength as measured by increased METs and functional capacity ( )       Able to understand and use rate of perceived exertion (RPE) scale Yes       Intervention Provide education and explanation on how to use RPE scale       Expected Outcomes Short Term: Able to use RPE daily in rehab to express subjective intensity level;Long Term:  Able to use RPE to guide intensity level when exercising independently       Able to understand and use Dyspnea scale Yes       Intervention Provide education and explanation on how to use Dyspnea scale       Expected Outcomes Short Term: Able to use Dyspnea scale daily in rehab to express subjective sense of shortness of breath during exertion;Long Term: Able to use Dyspnea scale to guide intensity level when exercising independently       Knowledge and understanding of Target Heart Rate Range (THRR) Yes       Intervention Provide education and explanation of THRR including how the numbers were predicted and where they are located for reference       Expected Outcomes Long Term: Able to use THRR to govern intensity when exercising independently;Short Term: Able to use daily as guideline for intensity in rehab;Short Term: Able to state/look up THRR       Able to check pulse independently Yes       Intervention Review the importance of being able to check your own  pulse for safety during independent exercise;Provide education and demonstration on how to  check pulse in carotid and radial arteries.       Expected Outcomes Short Term: Able to explain why pulse checking is important during independent exercise;Long Term: Able to check pulse independently and accurately       Understanding of Exercise Prescription Yes       Intervention Provide education, explanation, and written materials on patient's individual exercise prescription       Expected Outcomes Short Term: Able to explain program exercise prescription;Long Term: Able to explain home exercise prescription to exercise independently                Exercise Goals Re-Evaluation :  Exercise Goals Re-Evaluation     Row Name 11/19/22 1341 11/24/22 0900           Exercise Goal Re-Evaluation   Exercise Goals Review Able to understand and use rate of perceived exertion (RPE) scale;Increase Physical Activity;Knowledge and understanding of Target Heart Rate Range (THRR);Understanding of Exercise Prescription;Increase Strength and Stamina;Able to check pulse independently Increase Physical Activity;Increase Strength and Stamina;Understanding of Exercise Prescription      Comments Reviewed RPE scale, THR and program prescription with pt today.  Pt voiced understanding and was given a copy of goals to take home. Ruben [name] did well for his first session of exercise at rehab. He was able to reach 35 laps on the track and maintained appropriate RPEs. We will continue to monitor as he progresses in the program.      Expected Outcomes Short: Use RPE daily to regulate intensity.  Long: Follow program prescription in THR. Short: Continue to follow initial exercise prescription Long: Increase overall strength and stamina               Discharge Exercise Prescription (Final Exercise Prescription Changes):  Exercise Prescription Changes - 11/24/22 0800       Response to Exercise   Blood Pressure (Admit)  106/64    Blood Pressure (Exercise) 150/74    Blood Pressure (Exit) 124/64    Heart Rate (Admit) 56 bpm    Heart Rate (Exercise) 97 bpm    Heart Rate (Exit) 70 bpm    Rating of Perceived Exertion (Exercise) 13    Symptoms none    Comments 1st full day of exercise    Duration Continue with 30 min of aerobic exercise without signs/symptoms of physical distress.    Intensity THRR unchanged      Progression   Progression Continue to progress workloads to maintain intensity without signs/symptoms of physical distress.    Average METs 2.45      Resistance Training   Training Prescription Yes    Weight 4 lb    Reps 10-15      Interval Training   Interval Training No      Biostep-RELP   Level 2    Minutes 15    METs 2      Track   Laps 35    Minutes 15    METs 2.9      Oxygen   Maintain Oxygen Saturation 88% or higher             Nutrition:  Target Goals: Understanding of nutrition guidelines, daily intake of sodium 1500mg , cholesterol 200mg , calories 30% from fat and 7% or less from saturated fats, daily to have 5 or more servings of fruits and vegetables.  Education: All About Nutrition: -Group instruction provided by verbal, written material, interactive activities, discussions, models, and posters to present general guidelines for heart healthy nutrition  including fat, fiber, MyPlate, the role of sodium in heart healthy nutrition, utilization of the nutrition label, and utilization of this knowledge for meal planning. Follow up email sent as well. Written material given at graduation.   Biometrics:  Pre Biometrics - 11/18/22 1528       Pre Biometrics   Height 5' 8.5" (1.74 m)    Weight 131 lb (59.4 kg)    Waist Circumference 30 inches    Hip Circumference 34 inches    Waist to Hip Ratio 0.88 %    BMI (Calculated) 19.63    Single Leg Stand 30 seconds              Nutrition Therapy Plan and Nutrition Goals:  Nutrition Therapy & Goals - 11/18/22 1516        Intervention Plan   Intervention Prescribe, educate and counsel regarding individualized specific dietary modifications aiming towards targeted core components such as weight, hypertension, lipid management, diabetes, heart failure and other comorbidities.    Expected Outcomes Short Term Goal: Understand basic principles of dietary content, such as calories, fat, sodium, cholesterol and nutrients.;Short Term Goal: A plan has been developed with personal nutrition goals set during dietitian appointment.;Long Term Goal: Adherence to prescribed nutrition plan.             Nutrition Assessments:  MEDIFICTS Score Key: ?70 Need to make dietary changes  40-70 Heart Healthy Diet ? 40 Therapeutic Level Cholesterol Diet  Flowsheet Row Cardiac Rehab from 11/18/2022 in [facility] Cardiac and Pulmonary Rehab  Picture Your Plate Total Score on Admission 82      Picture Your Plate Scores: <16 Unhealthy dietary pattern with much room for improvement. 41-50 Dietary pattern unlikely to meet recommendations for good health and room for improvement. 51-60 More healthful dietary pattern, with some room for improvement.  >60 Healthy dietary pattern, although there may be some specific behaviors that could be improved.    Nutrition Goals Re-Evaluation:   Nutrition Goals Discharge (Final Nutrition Goals Re-Evaluation):   Psychosocial: Target Goals: Acknowledge presence or absence of significant depression and/or stress, maximize coping skills, provide positive support system. Participant is able to verbalize types and ability to use techniques and skills needed for reducing stress and depression.   Education: Stress, Anxiety, and Depression - Group verbal and visual presentation to define topics covered.  Reviews how body is impacted by stress, anxiety, and depression.  Also discusses healthy ways to reduce stress and to treat/manage anxiety and depression.  Written material given at  graduation.   Education: Sleep Hygiene -Provides group verbal and written instruction about how sleep can affect your health.  Define sleep hygiene, discuss sleep cycles and impact of sleep habits. Review good sleep hygiene tips.    Initial Review & Psychosocial Screening:  Initial Psych Review & Screening - 11/12/22 1340       Initial Review   Current issues with None Identified      Family Dynamics   Good Support System? Yes   wife Boyd Kerbs, daughter Leotis Shames,  friends     Barriers   Psychosocial barriers to participate in program There are no identifiable barriers or psychosocial needs.      Screening Interventions   Interventions Encouraged to exercise;To provide support and resources with identified psychosocial needs;Provide feedback about the scores to participant    Expected Outcomes Short Term goal: Utilizing psychosocial counselor, staff and physician to assist with identification of specific Stressors or current issues interfering with healing process. Setting desired goal  for each stressor or current issue identified.;Long Term Goal: Stressors or current issues are controlled or eliminated.;Short Term goal: Identification and review with participant of any Quality of Life or Depression concerns found by scoring the questionnaire.;Long Term goal: The participant improves quality of Life and PHQ9 Scores as seen by post scores and/or verbalization of changes             Quality of Life Scores:   Quality of Life - 11/18/22 1515       Quality of Life   Select Quality of Life      Quality of Life Scores   Health/Function Pre 27.5 %    Socioeconomic Pre 29.14 %    Psych/Spiritual Pre 30 %    Family Pre 25.2 %    GLOBAL Pre 28.01 %            Scores of 19 and below usually indicate a poorer quality of life in these areas.  A difference of  2-3 points is a clinically meaningful difference.  A difference of 2-3 points in the total score of the Quality of Life Index has  been associated with significant improvement in overall quality of life, self-image, physical symptoms, and general health in studies assessing change in quality of life.  PHQ-9: Review Flowsheet       11/18/2022 05/13/2017  Depression screen PHQ 2/9  Decreased Interest 0 0  Down, Depressed, Hopeless 0 0  PHQ - 2 Score 0 0  Altered sleeping 0 -  Tired, decreased energy 1 -  Change in appetite 0 -  Feeling bad or failure about yourself  0 -  Trouble concentrating 0 -  Moving slowly or fidgety/restless 0 -  Suicidal thoughts 0 -  PHQ-9 Score 1 -  Difficult doing work/chores Not difficult at all -   Interpretation of Total Score  Total Score Depression Severity:  1-4 = Minimal depression, 5-9 = Mild depression, 10-14 = Moderate depression, 15-19 = Moderately severe depression, 20-27 = Severe depression   Psychosocial Evaluation and Intervention:  Psychosocial Evaluation - 11/12/22 1400       Psychosocial Evaluation & Interventions   Interventions Encouraged to exercise with the program and follow exercise prescription    Comments Ruben [name] has no barriers to attending the program.  He wants to improve his strength upper and lower body. He has lost 8-10 lbs and wants to gain some back. He lives with his wife,Penny. She and their daughter Leotis Shames are his support as well as friends are being helpful. He is ready to start the program.    Expected Outcomes STG  attends all scheduled sessions and is able to workon exercise progression. LTG COntinues to work on his exercise progression after discharge.    Continue Psychosocial Services  Follow up required by staff             Psychosocial Re-Evaluation:   Psychosocial Discharge (Final Psychosocial Re-Evaluation):   Vocational Rehabilitation: Provide vocational rehab assistance to qualifying candidates.   Vocational Rehab Evaluation & Intervention:  Vocational Rehab - 11/12/22 1353       Initial Vocational Rehab Evaluation &  Intervention   Assessment shows need for Vocational Rehabilitation No      Vocational Rehab Re-Evaulation   Comments retired      Discharge Vocational Rehab   Discharge Vocational Rehabilitation retired             Education: Education Goals: Education classes will be provided on a variety of topics geared  toward better understanding of heart health and risk factor modification. Participant will state understanding/return demonstration of topics presented as noted by education test scores.  Learning Barriers/Preferences:  Learning Barriers/Preferences - 11/12/22 1346       Learning Barriers/Preferences   Learning Barriers Hearing   has high pitch ringing in his ear, may dull hearing   Learning Preferences Video;Written Material;Individual Instruction             General Cardiac Education Topics:  AED/CPR: - Group verbal and written instruction with the use of models to demonstrate the basic use of the AED with the basic ABC's of resuscitation.   Anatomy and Cardiac Procedures: - Group verbal and visual presentation and models provide information about basic cardiac anatomy and function. Reviews the testing methods done to diagnose heart disease and the outcomes of the test results. Describes the treatment choices: Medical Management, Angioplasty, or Coronary Bypass Surgery for treating various heart conditions including Myocardial Infarction, Angina, Valve Disease, and Cardiac Arrhythmias.  Written material given at graduation. Flowsheet Row Cardiac Rehab from 11/25/2022 in [facility] Cardiac and Pulmonary Rehab  Education need identified 11/18/22       Medication Safety: - Group verbal and visual instruction to review commonly prescribed medications for heart and lung disease. Reviews the medication, class of the drug, and side effects. Includes the steps to properly store meds and maintain the prescription regimen.  Written material given at graduation.   Intimacy: -  Group verbal instruction through game format to discuss how heart and lung disease can affect sexual intimacy. Written material given at graduation..   Know Your Numbers and Heart Failure: - Group verbal and visual instruction to discuss disease risk factors for cardiac and pulmonary disease and treatment options.  Reviews associated critical values for Overweight/Obesity, Hypertension, Cholesterol, and Diabetes.  Discusses basics of heart failure: signs/symptoms and treatments.  Introduces Heart Failure Zone chart for action plan for heart failure.  Written material given at graduation. Flowsheet Row Cardiac Rehab from 11/25/2022 in [facility] Cardiac and Pulmonary Rehab  Date 11/25/22  Educator MS  Instruction Review Code 1- Verbalizes Understanding       Infection Prevention: - Provides verbal and written material to individual with discussion of infection control including proper hand washing and proper equipment cleaning during exercise session. Flowsheet Row Cardiac Rehab from 11/25/2022 in [facility] Cardiac and Pulmonary Rehab  Date 11/18/22  Educator NT  Instruction Review Code 1- Verbalizes Understanding       Falls Prevention: - Provides verbal and written material to individual with discussion of falls prevention and safety. Flowsheet Row Cardiac Rehab from 11/25/2022 in [facility] Cardiac and Pulmonary Rehab  Date 11/12/22  Educator SB  Instruction Review Code 1- Verbalizes Understanding       Other: -Provides group and verbal instruction on various topics (see comments)   Knowledge Questionnaire Score:  Knowledge Questionnaire Score - 11/18/22 1516       Knowledge Questionnaire Score   Pre Score 23/26             Core Components/Risk Factors/Patient Goals at Admission:  Personal Goals and Risk Factors at Admission - 11/12/22 1348       Core Components/Risk Factors/Patient Goals on Admission    Weight Management Yes    Intervention Weight Management: Develop a combined  nutrition and exercise program designed to reach desired caloric intake, while maintaining appropriate intake of nutrient and fiber, sodium and fats, and appropriate energy expenditure required for the weight goal.;Weight Management: Provide  education and appropriate resources to help participant work on and attain dietary goals.    Admit Weight 120 lb (54.4 kg)   lost 8-10 lbs in hospital   Goal Weight: Short Term 122 lb (55.3 kg)    Goal Weight: Long Term 130 lb (59 kg)    Expected Outcomes Short Term: Continue to assess and modify interventions until short term weight is achieved;Long Term: Adherence to nutrition and physical activity/exercise program aimed toward attainment of established weight goal;Weight Gain: Understanding of general recommendations for a high calorie, high protein meal plan that promotes weight gain by distributing calorie intake throughout the day with the consumption for 4-5 meals, snacks, and/or supplements    Lipids Yes    Intervention Provide education and support for participant on nutrition & aerobic/resistive exercise along with prescribed medications to achieve LDL 70mg , HDL >40mg .    Expected Outcomes Short Term: Participant states understanding of desired cholesterol values and is compliant with medications prescribed. Participant is following exercise prescription and nutrition guidelines.;Long Term: Cholesterol controlled with medications as prescribed, with individualized exercise RX and with personalized nutrition plan. Value goals: LDL < 70mg , HDL > 40 mg.             Education:Diabetes - Individual verbal and written instruction to review signs/symptoms of diabetes, desired ranges of glucose level fasting, after meals and with exercise. Acknowledge that pre and post exercise glucose checks will be done for 3 sessions at entry of program.   Core Components/Risk Factors/Patient Goals Review:    Core Components/Risk Factors/Patient Goals at Discharge  (Final Review):    ITP Comments:  ITP Comments     Row Name 11/12/22 1404 11/18/22 1506 11/19/22 1341 12/02/22 1205     ITP Comments Virtual orientation call completed today. he has an appointment on Date: 11/18/2022  for EP eval and gym Orientation.  Documentation of diagnosis can be found in [facility]  Date: 10/03/2022 . Completed and gym orientation. Initial ITP created and sent for review to Dr. Bethann Punches, Medical Director. First full day of exercise!  Patient was oriented to gym and equipment including functions, settings, policies, and procedures.  Patient's individual exercise prescription and treatment plan were reviewed.  All starting workloads were established based on the results of the 6 minute walk test done at initial orientation visit.  The plan for exercise progression was also introduced and progression will be customized based on patient's performance and goals. 30 Day review completed. Medical Director ITP review done, changes made as directed, and signed approval by Medical Director.   new to program             Comments:

## 2022-12-02 NOTE — Progress Notes (Signed)
Daily Session Note  Patient Details  Name: Ruben [name] MRN: [id number] Date of Birth: 1946-03-18 Referring Provider:   Flowsheet Row Cardiac Rehab from 11/18/2022 in [facility] Cardiac and Pulmonary Rehab  Referring Provider Dr. Marcina Millard, MD       Encounter Date: 12/02/2022  Check In:  Session Check In - 12/02/22 1412       Check-In   Supervising physician immediately available to respond to emergencies See telemetry face sheet for immediately available ER MD    Location [facility]-Cardiac & Pulmonary Rehab    Staff Present Susann Givens, RN Atilano Median, RN, ADN;Laureen Manson Passey, BS, RRT, CPFT    Virtual Visit No    Medication changes reported     No    Fall or balance concerns reported    No    Warm-up and Cool-down Performed on first and last piece of equipment    Resistance Training Performed Yes    VAD Patient? No    PAD/SET Patient? No      Pain Assessment   Currently in Pain? No/denies                Social History   Tobacco Use  Smoking Status Never  Smokeless Tobacco Never    Goals Met:  Independence with exercise equipment Exercise tolerated well No report of concerns or symptoms today Strength training completed today  Goals Unmet:  Not Applicable  Comments: Pt able to follow exercise prescription today without complaint.  Will continue to monitor for progression.    Dr. Bethann Punches is Medical Director for [facility] Cardiac Rehabilitation.  Dr. Vida Rigger is Medical Director for [facility] Pulmonary Rehabilitation.

## 2022-12-03 ENCOUNTER — Encounter: Payer: PPO | Admitting: *Deleted

## 2022-12-03 DIAGNOSIS — Z951 Presence of aortocoronary bypass graft: Secondary | ICD-10-CM

## 2022-12-03 NOTE — Progress Notes (Signed)
Daily Session Note  Patient Details  Name: Ruben [name] MRN: [id number] Date of Birth: 04-30-1946 Referring Provider:   Flowsheet Row Cardiac Rehab from 11/18/2022 in [facility] Cardiac and Pulmonary Rehab  Referring Provider Dr. Marcina Millard, MD       Encounter Date: 12/03/2022  Check In:  Session Check In - 12/03/22 1349       Check-In   Supervising physician immediately available to respond to emergencies See telemetry face sheet for immediately available ER MD    Location [facility]-Cardiac & Pulmonary Rehab    Staff Present Lanny Hurst, RN, ADN;Jessica Juanetta Gosling, MA, RCEP, CCRP, CCET;Joseph [place], [place]    Virtual Visit No    Medication changes reported     No    Fall or balance concerns reported    No    Warm-up and Cool-down Performed on first and last piece of equipment    Resistance Training Performed Yes    VAD Patient? No    PAD/SET Patient? No      Pain Assessment   Currently in Pain? No/denies                Social History   Tobacco Use  Smoking Status Never  Smokeless Tobacco Never    Goals Met:  Independence with exercise equipment Exercise tolerated well No report of concerns or symptoms today Strength training completed today  Goals Unmet:  Not Applicable  Comments: Pt able to follow exercise prescription today without complaint.  Will continue to monitor for progression.    Dr. Bethann Punches is Medical Director for [facility] Cardiac Rehabilitation.  Dr. Vida Rigger is Medical Director for [facility] Pulmonary Rehabilitation.

## 2022-12-06 ENCOUNTER — Other Ambulatory Visit: Payer: Self-pay | Admitting: Surgical

## 2022-12-07 ENCOUNTER — Encounter: Payer: PPO | Admitting: *Deleted

## 2022-12-07 DIAGNOSIS — Z951 Presence of aortocoronary bypass graft: Secondary | ICD-10-CM | POA: Diagnosis not present

## 2022-12-07 NOTE — Progress Notes (Signed)
Daily Session Note  Patient Details  Name: Ruben [name] MRN: [id number] Date of Birth: [birth year] Referring Provider:   Flowsheet Row Cardiac Rehab from 11/18/2022 in [facility] Cardiac and Pulmonary Rehab  Referring Provider Dr. Marcina Millard, MD       Encounter Date: 12/07/2022  Check In:  Session Check In - 12/07/22 1406       Check-In   Supervising physician immediately available to respond to emergencies See telemetry face sheet for immediately available ER MD    Location [facility]-Cardiac & Pulmonary Rehab    Staff Present Susann Givens, RN BSN;Joseph Reino Kent, Guinevere Ferrari, RN, [place]    Virtual Visit No    Medication changes reported     No    Fall or balance concerns reported    No    Warm-up and Cool-down Performed on first and last piece of equipment    Resistance Training Performed Yes    VAD Patient? No    PAD/SET Patient? No      Pain Assessment   Currently in Pain? No/denies                Social History   Tobacco Use  Smoking Status Never  Smokeless Tobacco Never    Goals Met:  Independence with exercise equipment Exercise tolerated well No report of concerns or symptoms today Strength training completed today  Goals Unmet:  Not Applicable  Comments: Pt able to follow exercise prescription today without complaint.  Will continue to monitor for progression.    Dr. Bethann Punches is Medical Director for [facility] Cardiac Rehabilitation.  Dr. Vida Rigger is Medical Director for [facility] Pulmonary Rehabilitation.

## 2022-12-09 ENCOUNTER — Encounter: Payer: PPO | Admitting: *Deleted

## 2022-12-09 DIAGNOSIS — Z951 Presence of aortocoronary bypass graft: Secondary | ICD-10-CM

## 2022-12-09 NOTE — Progress Notes (Signed)
Daily Session Note  Patient Details  Name: Ruben [name] MRN: [id number] Date of Birth: 01-20-1946 Referring Provider:   Flowsheet Row Cardiac Rehab from 11/18/2022 in [facility] Cardiac and Pulmonary Rehab  Referring Provider Dr. Marcina Millard, MD       Encounter Date: 12/09/2022  Check In:  Session Check In - 12/09/22 1352       Check-In   Supervising physician immediately available to respond to emergencies See telemetry face sheet for immediately available ER MD    Location [facility]-Cardiac & Pulmonary Rehab    Staff Present Elige Ko, Algernon Huxley, BS, ACSM CEP, Exercise Physiologist;Megan Katrinka Blazing, RN, Eben Burow RN, BSN    Virtual Visit No    Medication changes reported     No    Fall or balance concerns reported    No    Warm-up and Cool-down Performed on first and last piece of equipment    Resistance Training Performed Yes    VAD Patient? No    PAD/SET Patient? No      Pain Assessment   Currently in Pain? No/denies                Social History   Tobacco Use  Smoking Status Never  Smokeless Tobacco Never    Goals Met:  Independence with exercise equipment Exercise tolerated well No report of concerns or symptoms today Strength training completed today  Goals Unmet:  Not Applicable  Comments: Pt able to follow exercise prescription today without complaint.  Will continue to monitor for progression.    Dr. Bethann Punches is Medical Director for [facility] Cardiac Rehabilitation.  Dr. Vida Rigger is Medical Director for [facility] Pulmonary Rehabilitation.

## 2022-12-10 ENCOUNTER — Encounter: Payer: PPO | Admitting: *Deleted

## 2022-12-10 DIAGNOSIS — Z951 Presence of aortocoronary bypass graft: Secondary | ICD-10-CM

## 2022-12-10 NOTE — Progress Notes (Signed)
Daily Session Note  Patient Details  Name: Ruben [name] MRN: [id number] Date of Birth: 09/10/1945 Referring Provider:   Flowsheet Row Cardiac Rehab from 11/18/2022 in [facility] Cardiac and Pulmonary Rehab  Referring Provider Dr. Marcina Millard, MD       Encounter Date: 12/10/2022  Check In:  Session Check In - 12/10/22 1335       Check-In   Supervising physician immediately available to respond to emergencies See telemetry face sheet for immediately available ER MD    Location [facility]-Cardiac & Pulmonary Rehab    Staff Present Lanny Hurst, RN, ADN;Joseph Olimpo, RCP,RRT,BSRT;Other   [place], [place]   Virtual Visit No    Medication changes reported     No    Fall or balance concerns reported    No    Warm-up and Cool-down Performed on first and last piece of equipment    Resistance Training Performed Yes    VAD Patient? No    PAD/SET Patient? No      Pain Assessment   Currently in Pain? No/denies                Social History   Tobacco Use  Smoking Status Never  Smokeless Tobacco Never    Goals Met:  Independence with exercise equipment Exercise tolerated well No report of concerns or symptoms today Strength training completed today  Goals Unmet:  Not Applicable  Comments: Pt able to follow exercise prescription today without complaint.  Will continue to monitor for progression.    Dr. Bethann Punches is Medical Director for [facility] Cardiac Rehabilitation.  Dr. Vida Rigger is Medical Director for [facility] Pulmonary Rehabilitation.

## 2022-12-13 ENCOUNTER — Other Ambulatory Visit: Payer: Self-pay | Admitting: Surgical

## 2022-12-16 ENCOUNTER — Encounter: Payer: PPO | Admitting: *Deleted

## 2022-12-16 ENCOUNTER — Other Ambulatory Visit: Payer: Self-pay | Admitting: Surgical

## 2022-12-16 ENCOUNTER — Other Ambulatory Visit: Payer: Self-pay | Admitting: Surgery

## 2022-12-16 DIAGNOSIS — Z951 Presence of aortocoronary bypass graft: Secondary | ICD-10-CM | POA: Diagnosis not present

## 2022-12-16 NOTE — Progress Notes (Signed)
Daily Session Note  Patient Details  Name: Ruben [name] MRN: [id number] Date of Birth: [birth year] Referring Provider:   Flowsheet Row Cardiac Rehab from 11/18/2022 in [facility] Cardiac and Pulmonary Rehab  Referring Provider Dr. Marcina Millard, MD       Encounter Date: 12/16/2022  Check In:  Session Check In - 12/16/22 1415       Check-In   Supervising physician immediately available to respond to emergencies See telemetry face sheet for immediately available ER MD    Location [facility]-Cardiac & Pulmonary Rehab    Staff Present Susann Givens, RN BSN;Jessica Juanetta Gosling, MA, RCEP, CCRP, Theodore Demark, RN, [place]    Virtual Visit No    Medication changes reported     No    Fall or balance concerns reported    No    Warm-up and Cool-down Performed on first and last piece of equipment    Resistance Training Performed Yes    VAD Patient? No    PAD/SET Patient? No      Pain Assessment   Currently in Pain? No/denies                Social History   Tobacco Use  Smoking Status Never  Smokeless Tobacco Never    Goals Met:  Independence with exercise equipment Exercise tolerated well No report of concerns or symptoms today Strength training completed today  Goals Unmet:  Not Applicable  Comments: Pt able to follow exercise prescription today without complaint.  Will continue to monitor for progression.    Dr. Bethann Punches is Medical Director for [facility] Cardiac Rehabilitation.  Dr. Vida Rigger is Medical Director for [facility] Pulmonary Rehabilitation.

## 2022-12-17 ENCOUNTER — Encounter: Payer: PPO | Admitting: *Deleted

## 2022-12-17 DIAGNOSIS — Z951 Presence of aortocoronary bypass graft: Secondary | ICD-10-CM

## 2022-12-17 NOTE — Progress Notes (Signed)
Daily Session Note  Patient Details  Name: Ruben [name] MRN: [id number] Date of Birth: [birth year] Referring Provider:   Flowsheet Row Cardiac Rehab from 11/18/2022 in [facility] Cardiac and Pulmonary Rehab  Referring Provider Dr. Marcina Millard, MD       Encounter Date: 12/17/2022  Check In:  Session Check In - 12/17/22 1348       Check-In   Supervising physician immediately available to respond to emergencies See telemetry face sheet for immediately available ER MD    Location [facility]-Cardiac & Pulmonary Rehab    Staff Present Lanny Hurst, RN, ADN;Jessica Juanetta Gosling, MA, RCEP, CCRP, CCET;Joseph Nebo, RCP,RRT,BSRT;Other   [place], [place]   Virtual Visit No    Medication changes reported     No    Fall or balance concerns reported    No    Warm-up and Cool-down Performed on first and last piece of equipment    Resistance Training Performed Yes    VAD Patient? No    PAD/SET Patient? No      Pain Assessment   Currently in Pain? No/denies                Social History   Tobacco Use  Smoking Status Never  Smokeless Tobacco Never    Goals Met:  Independence with exercise equipment Exercise tolerated well No report of concerns or symptoms today Strength training completed today  Goals Unmet:  Not Applicable  Comments: Pt able to follow exercise prescription today without complaint.  Will continue to monitor for progression.  Reviewed home exercise with pt today.  Pt plans to continue to walk at home for exercise.  Reviewed THR, pulse, RPE, sign and symptoms, pulse oximetery and when to call 911 or MD.  Also discussed weather considerations and indoor options.  Pt voiced understanding.    Dr. Bethann Punches is Medical Director for [facility] Cardiac Rehabilitation.  Dr. Vida Rigger is Medical Director for [facility] Pulmonary Rehabilitation.

## 2022-12-21 ENCOUNTER — Encounter: Payer: PPO | Attending: Cardiology | Admitting: *Deleted

## 2022-12-21 DIAGNOSIS — Z951 Presence of aortocoronary bypass graft: Secondary | ICD-10-CM | POA: Insufficient documentation

## 2022-12-21 NOTE — Progress Notes (Signed)
Daily Session Note  Patient Details  Name: JACORIUS PAGET MRN: [id number] Date of Birth: 1945/09/19 Referring Provider:   Flowsheet Row Cardiac Rehab from 11/18/2022 in [facility] Cardiac and Pulmonary Rehab  Referring Provider Dr. Marcina Millard, MD       Encounter Date: 12/21/2022  Check In:  Session Check In - 12/21/22 1354       Check-In   Supervising physician immediately available to respond to emergencies See telemetry face sheet for immediately available ER MD    Location [facility]-Cardiac & Pulmonary Rehab    Staff Present Cyndia Diver, RN, BSN, Cammie Sickle, Guinevere Ferrari, RN, ADN    Virtual Visit No    Medication changes reported     No    Fall or balance concerns reported    No    Tobacco Cessation No Change    Warm-up and Cool-down Performed on first and last piece of equipment    Resistance Training Performed Yes    VAD Patient? No    PAD/SET Patient? No      Pain Assessment   Currently in Pain? No/denies                Social History   Tobacco Use  Smoking Status Never  Smokeless Tobacco Never    Goals Met:  Independence with exercise equipment Exercise tolerated well No report of concerns or symptoms today  Goals Unmet:  Not Applicable  Comments: .Pt able to follow exercise prescription today without complaint.  Will continue to monitor for progression.    Dr. Bethann Punches is Medical Director for [facility] Cardiac Rehabilitation.  Dr. Vida Rigger is Medical Director for [facility] Pulmonary Rehabilitation.

## 2022-12-23 ENCOUNTER — Encounter: Payer: PPO | Admitting: *Deleted

## 2022-12-23 DIAGNOSIS — Z951 Presence of aortocoronary bypass graft: Secondary | ICD-10-CM

## 2022-12-23 NOTE — Progress Notes (Signed)
Daily Session Note  Patient Details  Name: Ruben [name] MRN: [id number] Date of Birth: 1946-06-22 Referring Provider:   Flowsheet Row Cardiac Rehab from 11/18/2022 in [facility] Cardiac and Pulmonary Rehab  Referring Provider Dr. Marcina Millard, MD       Encounter Date: 12/23/2022  Check In:  Session Check In - 12/23/22 1353       Check-In   Supervising physician immediately available to respond to emergencies See telemetry face sheet for immediately available ER MD    Location [facility]-Cardiac & Pulmonary Rehab    Staff Present Cora Collum, RN, BSN, CCRP;Alany Borman RN, BSN;Jessica Fillmore, MA, RCEP, CCRP, CCET;Noah Tickle, BS, Exercise Physiologist    Virtual Visit No    Medication changes reported     No    Fall or balance concerns reported    No    Tobacco Cessation --    Warm-up and Cool-down Performed on first and last piece of equipment    Resistance Training Performed Yes    VAD Patient? No    PAD/SET Patient? No      Pain Assessment   Currently in Pain? No/denies                Social History   Tobacco Use  Smoking Status Never  Smokeless Tobacco Never    Goals Met:  Independence with exercise equipment Exercise tolerated well No report of concerns or symptoms today Strength training completed today  Goals Unmet:  Not Applicable  Comments: Pt able to follow exercise prescription today without complaint.  Will continue to monitor for progression.    Dr. Bethann Punches is Medical Director for [facility] Cardiac Rehabilitation.  Dr. Vida Rigger is Medical Director for [facility] Pulmonary Rehabilitation.

## 2022-12-24 ENCOUNTER — Encounter: Payer: PPO | Admitting: *Deleted

## 2022-12-24 DIAGNOSIS — Z951 Presence of aortocoronary bypass graft: Secondary | ICD-10-CM | POA: Diagnosis not present

## 2022-12-24 NOTE — Progress Notes (Signed)
Daily Session Note  Patient Details  Name: Ruben [name] MRN: [id number] Date of Birth: [birth year] Referring Provider:   Flowsheet Row Cardiac Rehab from 11/18/2022 in [facility] Cardiac and Pulmonary Rehab  Referring Provider Dr. Marcina Millard, MD       Encounter Date: 12/24/2022  Check In:  Session Check In - 12/24/22 1350       Check-In   Supervising physician immediately available to respond to emergencies See telemetry face sheet for immediately available ER MD    Location [facility]-Cardiac & Pulmonary Rehab    Staff Present Cyndia Diver, RN, BSN, Cammie Sickle, [place], [place], RCEP, CCRP, CCET    Virtual Visit No    Medication changes reported     No    Fall or balance concerns reported    No    Tobacco Cessation No Change    Warm-up and Cool-down Performed on first and last piece of equipment    Resistance Training Performed Yes    VAD Patient? No    PAD/SET Patient? No      Pain Assessment   Currently in Pain? No/denies                Social History   Tobacco Use  Smoking Status Never  Smokeless Tobacco Never    Goals Met:  Independence with exercise equipment Exercise tolerated well No report of concerns or symptoms today  Goals Unmet:  Not Applicable  Comments: Pt able to follow exercise prescription today without complaint.  Will continue to monitor for progression.    Dr. Bethann Punches is Medical Director for [facility] Cardiac Rehabilitation.  Dr. Vida Rigger is Medical Director for [facility] Pulmonary Rehabilitation.

## 2022-12-28 ENCOUNTER — Encounter: Payer: PPO | Admitting: *Deleted

## 2022-12-28 DIAGNOSIS — Z951 Presence of aortocoronary bypass graft: Secondary | ICD-10-CM

## 2022-12-28 NOTE — Progress Notes (Signed)
Daily Session Note  Patient Details  Name: Ruben [name] MRN: [id number] Date of Birth: 11-18-1945 Referring Provider:   Flowsheet Row Cardiac Rehab from 11/18/2022 in [facility] Cardiac and Pulmonary Rehab  Referring Provider Dr. Marcina Millard, MD       Encounter Date: 12/28/2022  Check In:  Session Check In - 12/28/22 1346       Check-In   Supervising physician immediately available to respond to emergencies See telemetry face sheet for immediately available ER MD    Location [facility]-Cardiac & Pulmonary Rehab    Staff Present Susann Givens, RN BSN;Laureen Manson Passey, BS, RRT, CPFT;Joseph [place], [place]    Virtual Visit No    Medication changes reported     No    Fall or balance concerns reported    No    Warm-up and Cool-down Performed on first and last piece of equipment    Resistance Training Performed Yes    VAD Patient? No    PAD/SET Patient? No      Pain Assessment   Currently in Pain? No/denies                Social History   Tobacco Use  Smoking Status Never  Smokeless Tobacco Never    Goals Met:  Independence with exercise equipment Exercise tolerated well No report of concerns or symptoms today Strength training completed today  Goals Unmet:  Not Applicable  Comments: Pt able to follow exercise prescription today without complaint.  Will continue to monitor for progression.    Dr. Bethann Punches is Medical Director for [facility] Cardiac Rehabilitation.  Dr. Vida Rigger is Medical Director for [facility] Pulmonary Rehabilitation.

## 2022-12-30 ENCOUNTER — Encounter: Payer: Self-pay | Admitting: *Deleted

## 2022-12-30 ENCOUNTER — Encounter: Payer: PPO | Admitting: *Deleted

## 2022-12-30 DIAGNOSIS — Z951 Presence of aortocoronary bypass graft: Secondary | ICD-10-CM | POA: Diagnosis not present

## 2022-12-30 NOTE — Progress Notes (Signed)
Daily Session Note  Patient Details  Name: Ruben [name] MRN: [id number] Date of Birth: [birth year] Referring Provider:   Flowsheet Row Cardiac Rehab from 11/18/2022 in [facility] Cardiac and Pulmonary Rehab  Referring Provider Dr. Marcina Millard, MD       Encounter Date: 12/30/2022  Check In:  Session Check In - 12/30/22 1335       Check-In   Supervising physician immediately available to respond to emergencies See telemetry face sheet for immediately available ER MD    Location [facility]-Cardiac & Pulmonary Rehab    Staff Present Susann Givens, RN BSN;Laureen Manson Passey, BS, RRT, CPFT;Joseph Reino [place], [place]    Virtual Visit No    Medication changes reported     No    Fall or balance concerns reported    No    Warm-up and Cool-down Performed on first and last piece of equipment    Resistance Training Performed Yes    VAD Patient? No    PAD/SET Patient? No      Pain Assessment   Currently in Pain? No/denies                Social History   Tobacco Use  Smoking Status Never  Smokeless Tobacco Never    Goals Met:  Independence with exercise equipment Exercise tolerated well No report of concerns or symptoms today Strength training completed today  Goals Unmet:  Not Applicable  Comments: Pt able to follow exercise prescription today without complaint.  Will continue to monitor for progression.    Dr. Bethann Punches is Medical Director for [facility] Cardiac Rehabilitation.  Dr. Vida Rigger is Medical Director for [facility] Pulmonary Rehabilitation.

## 2022-12-30 NOTE — Progress Notes (Signed)
Cardiac Individual Treatment Plan  Patient Details  Name: Ruben [name] MRN: [id number] Date of Birth: Oct 19, 1945 Referring Provider:   Flowsheet Row Cardiac Rehab from 11/18/2022 in [facility] Cardiac and Pulmonary Rehab  Referring Provider Dr. Marcina Millard, MD       Initial Encounter Date:  Flowsheet Row Cardiac Rehab from 11/18/2022 in [facility] Cardiac and Pulmonary Rehab  Date 11/18/22       Visit Diagnosis: S/P CABG x 3  Patient's Home Medications on Admission:  Current Outpatient Medications:    amiodarone (PACERONE) 200 MG tablet, Take 1 tablet (200 mg total) by mouth 2 (two) times daily. For 7 days then reduce the dose to 1 tablet (200 mg) twice daily., Disp: 60 tablet, Rfl: 2   apixaban (ELIQUIS) 5 MG TABS tablet, Take 1 tablet (5 mg total) by mouth 2 (two) times daily., Disp: 60 tablet, Rfl: 1   aspirin EC 81 MG tablet, Take 1 tablet (81 mg total) by mouth daily. Swallow whole., Disp: 30 tablet, Rfl: 12   cholecalciferol (VITAMIN D3) 25 MCG (1000 UNIT) tablet, Take 1,000 Units by mouth daily., Disp: , Rfl:    metoprolol succinate (TOPROL-XL) 25 MG 24 hr tablet, Take 1 tablet (25 mg total) by mouth daily., Disp: 30 tablet, Rfl: 1   Polyvinyl Alcohol-Povidone (REFRESH OP), Place 1 drop into both eyes as needed (dry eye)., Disp: , Rfl:    rosuvastatin (CRESTOR) 20 MG tablet, Take 2 tablets (40 mg total) by mouth at bedtime., Disp: 60 tablet, Rfl: 11   vitamin B-12 (CYANOCOBALAMIN) 1000 MCG tablet, Take 1,000 mcg by mouth daily., Disp: , Rfl:    vitamin C (ASCORBIC ACID) 250 MG tablet, Take 250 mg by mouth daily., Disp: , Rfl:   Past Medical History: Past Medical History:  Diagnosis Date   TIA (transient ischemic attack)     Tobacco Use: Social History   Tobacco Use  Smoking Status Never  Smokeless Tobacco Never    Labs: Review Flowsheet  More data may exist      Latest Ref Rng & Units 11/03/2013 10/29/2020 10/02/2022 10/04/2022 10/05/2022  Labs for ITP Cardiac and  Pulmonary Rehab  Cholestrol 0 - 200 mg/dL 086  578  - - -  LDL (calc) 0 - 99 mg/dL 469  629  - - -  HDL-C >40 mg/dL 31  34  - - -  Trlycerides <150 mg/dL 528  413  - - -  Hemoglobin A1c 4.8 - 5.6 % 5.3  5.1  5.4  5.3  -  PH, Arterial 7.35 - 7.45 - - - 7.43  7.392  7.394  7.452  7.464  7.413   PCO2 arterial 32 - 48 mmHg - - - 39  31.4  33.2  32.7  30.4  38.3   Bicarbonate 20.0 - 28.0 mmol/L - - - 25.9  19.1  20.5  22.8  21.8  25.4  24.4   TCO2 22 - 32 mmol/L - - - - 20  22  24  24  23  26  27  26  29  26    Acid-base deficit 0.0 - 2.0 mmol/L - - - - 5.0  4.0  1.0  2.0   O2 Saturation % - - - 98.1  100  100  100  100  81  100      Exercise Target Goals: Exercise Program Goal: Individual exercise prescription set using results from initial 6 min walk test and THRR while considering  patient's activity barriers  and safety.   Exercise Prescription Goal: Initial exercise prescription builds to 30-45 minutes a day of aerobic activity, 2-3 days per week.  Home exercise guidelines will be given to patient during program as part of exercise prescription that the participant will acknowledge.   Education: Aerobic Exercise: - Group verbal and visual presentation on the components of exercise prescription. Introduces F.I.T.T principle from ACSM for exercise prescriptions.  Reviews F.I.T.T. principles of aerobic exercise including progression. Written material given at graduation. Flowsheet Row Cardiac Rehab from 12/02/2022 in [facility] Cardiac and Pulmonary Rehab  Education need identified 11/18/22       Education: Resistance Exercise: - Group verbal and visual presentation on the components of exercise prescription. Introduces F.I.T.T principle from ACSM for exercise prescriptions  Reviews F.I.T.T. principles of resistance exercise including progression. Written material given at graduation.    Education: Exercise & Equipment Safety: - Individual verbal instruction and demonstration of equipment use  and safety with use of the equipment. Flowsheet Row Cardiac Rehab from 12/02/2022 in [facility] Cardiac and Pulmonary Rehab  Date 11/18/22  Educator NT  Instruction Review Code 1- Verbalizes Understanding       Education: Exercise Physiology & General Exercise Guidelines: - Group verbal and written instruction with models to review the exercise physiology of the cardiovascular system and associated critical values. Provides general exercise guidelines with specific guidelines to those with heart or lung disease.    Education: Flexibility, Balance, Mind/Body Relaxation: - Group verbal and visual presentation with interactive activity on the components of exercise prescription. Introduces F.I.T.T principle from ACSM for exercise prescriptions. Reviews F.I.T.T. principles of flexibility and balance exercise training including progression. Also discusses the mind body connection.  Reviews various relaxation techniques to help reduce and manage stress (i.e. Deep breathing, progressive muscle relaxation, and visualization). Balance handout provided to take home. Written material given at graduation.   Activity Barriers & Risk Stratification:  Activity Barriers & Cardiac Risk Stratification - 11/18/22 1527       Activity Barriers & Cardiac Risk Stratification   Activity Barriers Other (comment)   R leg soreness   Comments R leg soreness    Cardiac Risk Stratification High             6 Minute Walk:  6 Minute Walk     Row Name 11/18/22 1526         6 Minute Walk   Phase Initial     Distance 1220 feet     Walk Time 6 minutes     # of Rest Breaks 0     MPH 2.31     METS 2.91     RPE 12     Perceived Dyspnea  0     VO2 Peak 10.2     Symptoms No     Resting HR 62 bpm     Resting BP 136/68     Resting Oxygen Saturation  99 %     Exercise Oxygen Saturation  during 6 min walk 100 %     Max Ex. HR 84 bpm     Max Ex. BP 144/70     2 Minute Post BP 128/70              Oxygen  Initial Assessment:   Oxygen Re-Evaluation:   Oxygen Discharge (Final Oxygen Re-Evaluation):   Initial Exercise Prescription:  Initial Exercise Prescription - 11/18/22 1500       Date of Initial Exercise RX and Referring Provider   Date 11/18/22  Referring Provider Dr. Marcina Millard, MD      Oxygen   Maintain Oxygen Saturation 88% or higher      Treadmill   MPH 2.1    Grade 0.5    Minutes 15    METs 2.75      Recumbant Bike   Level 2    RPM 50    Watts 17    Minutes 15    METs 2.91      NuStep   Level 3    SPM 80    Minutes 15    METs 2.91      REL-XR   Level 2    Speed 50    Minutes 15    METs 2.91      Prescription Details   Frequency (times per week) 3    Duration Progress to 30 minutes of continuous aerobic without signs/symptoms of physical distress      Intensity   THRR 40-80% of Max Heartrate 95-128    Ratings of Perceived Exertion 11-13    Perceived Dyspnea 0-4      Progression   Progression Continue to progress workloads to maintain intensity without signs/symptoms of physical distress.      Resistance Training   Training Prescription Yes    Weight 4 lb    Reps 10-15             Perform Capillary Blood Glucose checks as needed.  Exercise Prescription Changes:   Exercise Prescription Changes     Row Name 11/18/22 1500 11/24/22 0800 12/08/22 0800 12/17/22 1400 12/22/22 0800     Response to Exercise   Blood Pressure (Admit) 136/68 106/64 102/58 -- 124/70   Blood Pressure (Exercise) 144/70 150/74 140/80 -- --   Blood Pressure (Exit) 128/70 124/64 114/62 -- 112/60   Heart Rate (Admit) 62 bpm 56 bpm 65 bpm -- 87 bpm   Heart Rate (Exercise) 84 bpm 97 bpm 102 bpm -- 109 bpm   Heart Rate (Exit) 67 bpm 70 bpm 80 bpm -- 65 bpm   Oxygen Saturation (Admit) 99 % -- -- -- --   Oxygen Saturation (Exercise) 100 % -- -- -- --   Rating of Perceived Exertion (Exercise) 12 13 13  -- 13   Perceived Dyspnea (Exercise) 0 -- -- -- --    Symptoms none none none -- none   Comments Results 1st full day of exercise -- -- --   Duration -- Continue with 30 min of aerobic exercise without signs/symptoms of physical distress. Continue with 30 min of aerobic exercise without signs/symptoms of physical distress. -- Continue with 30 min of aerobic exercise without signs/symptoms of physical distress.   Intensity -- THRR unchanged THRR unchanged -- THRR unchanged     Progression   Progression -- Continue to progress workloads to maintain intensity without signs/symptoms of physical distress. Continue to progress workloads to maintain intensity without signs/symptoms of physical distress. -- Continue to progress workloads to maintain intensity without signs/symptoms of physical distress.   Average METs -- 2.45 3.31 -- 3.57     Resistance Training   Training Prescription -- Yes Yes -- Yes   Weight -- 4 lb 4 lb -- 4 lb   Reps -- 10-15 10-15 -- 10-15     Interval Training   Interval Training -- No No -- No     Recumbant Bike   Level -- -- 1 -- 5   Watts -- -- 13 -- --   Minutes -- --  15 -- 15   METs -- -- 2.7 -- --     NuStep   Level -- -- 4 -- 3   Minutes -- -- 15 -- 15   METs -- -- 3.5 -- 3.8     REL-XR   Level -- -- 4 -- --   Minutes -- -- 15 -- --   METs -- -- 5.5 -- --     Biostep-RELP   Level -- 2 -- -- --   Minutes -- 15 -- -- --   METs -- 2 -- -- --     Track   Laps -- 35 36 -- 43   Minutes -- 15 15 -- 15   METs -- 2.9 2.96 -- 3.34     Home Exercise Plan   Plans to continue exercise at -- -- -- Home (comment)  walking Home (comment)  walking   Frequency -- -- -- Add 2 additional days to program exercise sessions. Add 2 additional days to program exercise sessions.   Initial Home Exercises Provided -- -- -- 12/17/22 12/17/22     Oxygen   Maintain Oxygen Saturation -- 88% or higher 88% or higher -- 88% or higher            Exercise Comments:   Exercise Comments     Row Name 11/19/22 1341            Exercise Comments First full day of exercise!  Patient was oriented to gym and equipment including functions, settings, policies, and procedures.  Patient's individual exercise prescription and treatment plan were reviewed.  All starting workloads were established based on the results of the 6 minute walk test done at initial orientation visit.  The plan for exercise progression was also introduced and progression will be customized based on patient's performance and goals.                Exercise Goals and Review:   Exercise Goals     Row Name 11/18/22 1528             Exercise Goals   Increase Physical Activity Yes       Intervention Provide advice, education, support and counseling about physical activity/exercise needs.;Develop an individualized exercise prescription for aerobic and resistive training based on initial evaluation findings, risk stratification, comorbidities and participant's personal goals.       Expected Outcomes Short Term: Attend rehab on a regular basis to increase amount of physical activity.;Long Term: Add in home exercise to make exercise part of routine and to increase amount of physical activity.;Long Term: Exercising regularly at least 3-5 days a week.       Increase Strength and Stamina Yes       Intervention Provide advice, education, support and counseling about physical activity/exercise needs.;Develop an individualized exercise prescription for aerobic and resistive training based on initial evaluation findings, risk stratification, comorbidities and participant's personal goals.       Expected Outcomes Short Term: Increase workloads from initial exercise prescription for resistance, speed, and METs.;Short Term: Perform resistance training exercises routinely during rehab and add in resistance training at home;Long Term: Improve cardiorespiratory fitness, muscular endurance and strength as measured by increased METs and functional capacity ( )        Able to understand and use rate of perceived exertion (RPE) scale Yes       Intervention Provide education and explanation on how to use RPE scale       Expected Outcomes Short Term:  Able to use RPE daily in rehab to express subjective intensity level;Long Term:  Able to use RPE to guide intensity level when exercising independently       Able to understand and use Dyspnea scale Yes       Intervention Provide education and explanation on how to use Dyspnea scale       Expected Outcomes Short Term: Able to use Dyspnea scale daily in rehab to express subjective sense of shortness of breath during exertion;Long Term: Able to use Dyspnea scale to guide intensity level when exercising independently       Knowledge and understanding of Target Heart Rate Range (THRR) Yes       Intervention Provide education and explanation of THRR including how the numbers were predicted and where they are located for reference       Expected Outcomes Long Term: Able to use THRR to govern intensity when exercising independently;Short Term: Able to use daily as guideline for intensity in rehab;Short Term: Able to state/look up THRR       Able to check pulse independently Yes       Intervention Review the importance of being able to check your own pulse for safety during independent exercise;Provide education and demonstration on how to check pulse in carotid and radial arteries.       Expected Outcomes Short Term: Able to explain why pulse checking is important during independent exercise;Long Term: Able to check pulse independently and accurately       Understanding of Exercise Prescription Yes       Intervention Provide education, explanation, and written materials on patient's individual exercise prescription       Expected Outcomes Short Term: Able to explain program exercise prescription;Long Term: Able to explain home exercise prescription to exercise independently                Exercise Goals Re-Evaluation  :  Exercise Goals Re-Evaluation     Row Name 11/19/22 1341 11/24/22 0900 12/08/22 0901 12/17/22 1355 12/22/22 0854     Exercise Goal Re-Evaluation   Exercise Goals Review Able to understand and use rate of perceived exertion (RPE) scale;Increase Physical Activity;Knowledge and understanding of Target Heart Rate Range (THRR);Understanding of Exercise Prescription;Increase Strength and Stamina;Able to check pulse independently Increase Physical Activity;Increase Strength and Stamina;Understanding of Exercise Prescription Increase Physical Activity;Increase Strength and Stamina;Understanding of Exercise Prescription Increase Physical Activity;Increase Strength and Stamina;Understanding of Exercise Prescription;Able to understand and use rate of perceived exertion (RPE) scale;Able to understand and use Dyspnea scale;Knowledge and understanding of Target Heart Rate Range (THRR);Able to check pulse independently Increase Physical Activity;Increase Strength and Stamina;Understanding of Exercise Prescription   Comments Reviewed RPE scale, THR and program prescription with pt today.  Pt voiced understanding and was given a copy of goals to take home. Charlie did well for his first session of exercise at rehab. He was able to reach 35 laps on the track and maintained appropriate RPEs. We will continue to monitor as he progresses in the program. Billey Gosling continues to do well in rehab. He increased his overall average MET level to 3.31 METs. He also improved to level 4 on the T4 nustep and XR. He has stayed consistent with the recumbent bike at level 1. He increased his laps on the track up to 36 laps as well. We will continue to monitor his progress in the program. Billey Gosling is doing well in rehab.  He is walking some on his own already. Reviewed home exercise with  pt today.  Pt plans to continue to walk at home for exercise.  Reviewed THR, pulse, RPE, sign and symptoms, pulse oximetery and when to call 911 or MD.  Also  discussed weather considerations and indoor options.  Pt voiced understanding. Billey Gosling is doing well in rehab.  He is up to 43 laps on the track!  He is also up to level 5 on the recumbent bike.  We will conitnue to monitor his progress.   Expected Outcomes Short: Use RPE daily to regulate intensity.  Long: Follow program prescription in THR. Short: Continue to follow initial exercise prescription Long: Increase overall strength and stamina Short: Try level 2 on the recumbent bike. Long: Continue to improve strength and stamina. Short: Start to add in more exercise at home Long: Continue to exercise independently Short: Continue to aim for a mile each day on track Long: Continue to improve stamina            Discharge Exercise Prescription (Final Exercise Prescription Changes):  Exercise Prescription Changes - 12/22/22 0800       Response to Exercise   Blood Pressure (Admit) 124/70    Blood Pressure (Exit) 112/60    Heart Rate (Admit) 87 bpm    Heart Rate (Exercise) 109 bpm    Heart Rate (Exit) 65 bpm    Rating of Perceived Exertion (Exercise) 13    Symptoms none    Duration Continue with 30 min of aerobic exercise without signs/symptoms of physical distress.    Intensity THRR unchanged      Progression   Progression Continue to progress workloads to maintain intensity without signs/symptoms of physical distress.    Average METs 3.57      Resistance Training   Training Prescription Yes    Weight 4 lb    Reps 10-15      Interval Training   Interval Training No      Recumbant Bike   Level 5    Minutes 15      NuStep   Level 3    Minutes 15    METs 3.8      Track   Laps 43    Minutes 15    METs 3.34      Home Exercise Plan   Plans to continue exercise at Home (comment)   walking   Frequency Add 2 additional days to program exercise sessions.    Initial Home Exercises Provided 12/17/22      Oxygen   Maintain Oxygen Saturation 88% or higher              Nutrition:  Target Goals: Understanding of nutrition guidelines, daily intake of sodium 1500mg , cholesterol 200mg , calories 30% from fat and 7% or less from saturated fats, daily to have 5 or more servings of fruits and vegetables.  Education: All About Nutrition: -Group instruction provided by verbal, written material, interactive activities, discussions, models, and posters to present general guidelines for heart healthy nutrition including fat, fiber, MyPlate, the role of sodium in heart healthy nutrition, utilization of the nutrition label, and utilization of this knowledge for meal planning. Follow up email sent as well. Written material given at graduation.   Biometrics:  Pre Biometrics - 11/18/22 1528       Pre Biometrics   Height 5' 8.5" (1.74 m)    Weight 131 lb (59.4 kg)    Waist Circumference 30 inches    Hip Circumference 34 inches    Waist to Hip Ratio 0.88 %  BMI (Calculated) 19.63    Single Leg Stand 30 seconds              Nutrition Therapy Plan and Nutrition Goals:  Nutrition Therapy & Goals - 11/18/22 1516       Intervention Plan   Intervention Prescribe, educate and counsel regarding individualized specific dietary modifications aiming towards targeted core components such as weight, hypertension, lipid management, diabetes, heart failure and other comorbidities.    Expected Outcomes Short Term Goal: Understand basic principles of dietary content, such as calories, fat, sodium, cholesterol and nutrients.;Short Term Goal: A plan has been developed with personal nutrition goals set during dietitian appointment.;Long Term Goal: Adherence to prescribed nutrition plan.             Nutrition Assessments:  MEDIFICTS Score Key: ?70 Need to make dietary changes  40-70 Heart Healthy Diet ? 40 Therapeutic Level Cholesterol Diet  Flowsheet Row Cardiac Rehab from 11/18/2022 in [facility] Cardiac and Pulmonary Rehab  Picture Your Plate Total Score on Admission  82      Picture Your Plate Scores: <13 Unhealthy dietary pattern with much room for improvement. 41-50 Dietary pattern unlikely to meet recommendations for good health and room for improvement. 51-60 More healthful dietary pattern, with some room for improvement.  >60 Healthy dietary pattern, although there may be some specific behaviors that could be improved.    Nutrition Goals Re-Evaluation:  Nutrition Goals Re-Evaluation     Row Name 12/17/22 1358             Goals   Nutrition Goal Set up appt with dietitian       Comment Charlie eats a whole foods diet and has been doing so for 6 years.  He feels he has a good grasp but still wants to meet.  He does not eat processed food and uses beans for protein source.       Expected Outcome Short: Set up appt with dietitian Long: Continue to eat heart healthy                Nutrition Goals Discharge (Final Nutrition Goals Re-Evaluation):  Nutrition Goals Re-Evaluation - 12/17/22 1358       Goals   Nutrition Goal Set up appt with dietitian    Comment Charlie eats a whole foods diet and has been doing so for 6 years.  He feels he has a good grasp but still wants to meet.  He does not eat processed food and uses beans for protein source.    Expected Outcome Short: Set up appt with dietitian Long: Continue to eat heart healthy             Psychosocial: Target Goals: Acknowledge presence or absence of significant depression and/or stress, maximize coping skills, provide positive support system. Participant is able to verbalize types and ability to use techniques and skills needed for reducing stress and depression.   Education: Stress, Anxiety, and Depression - Group verbal and visual presentation to define topics covered.  Reviews how body is impacted by stress, anxiety, and depression.  Also discusses healthy ways to reduce stress and to treat/manage anxiety and depression.  Written material given at  graduation.   Education: Sleep Hygiene -Provides group verbal and written instruction about how sleep can affect your health.  Define sleep hygiene, discuss sleep cycles and impact of sleep habits. Review good sleep hygiene tips.    Initial Review & Psychosocial Screening:  Initial Psych Review & Screening - 11/12/22 1340  Initial Review   Current issues with None Identified      Family Dynamics   Good Support System? Yes   wife Boyd Kerbs, daughter Leotis Shames,  friends     Barriers   Psychosocial barriers to participate in program There are no identifiable barriers or psychosocial needs.      Screening Interventions   Interventions Encouraged to exercise;To provide support and resources with identified psychosocial needs;Provide feedback about the scores to participant    Expected Outcomes Short Term goal: Utilizing psychosocial counselor, staff and physician to assist with identification of specific Stressors or current issues interfering with healing process. Setting desired goal for each stressor or current issue identified.;Long Term Goal: Stressors or current issues are controlled or eliminated.;Short Term goal: Identification and review with participant of any Quality of Life or Depression concerns found by scoring the questionnaire.;Long Term goal: The participant improves quality of Life and PHQ9 Scores as seen by post scores and/or verbalization of changes             Quality of Life Scores:   Quality of Life - 11/18/22 1515       Quality of Life   Select Quality of Life      Quality of Life Scores   Health/Function Pre 27.5 %    Socioeconomic Pre 29.14 %    Psych/Spiritual Pre 30 %    Family Pre 25.2 %    GLOBAL Pre 28.01 %            Scores of 19 and below usually indicate a poorer quality of life in these areas.  A difference of  2-3 points is a clinically meaningful difference.  A difference of 2-3 points in the total score of the Quality of Life Index has  been associated with significant improvement in overall quality of life, self-image, physical symptoms, and general health in studies assessing change in quality of life.  PHQ-9: Review Flowsheet       11/18/2022 05/13/2017  Depression screen PHQ 2/9  Decreased Interest 0 0  Down, Depressed, Hopeless 0 0  PHQ - 2 Score 0 0  Altered sleeping 0 -  Tired, decreased energy 1 -  Change in appetite 0 -  Feeling bad or failure about yourself  0 -  Trouble concentrating 0 -  Moving slowly or fidgety/restless 0 -  Suicidal thoughts 0 -  PHQ-9 Score 1 -  Difficult doing work/chores Not difficult at all -   Interpretation of Total Score  Total Score Depression Severity:  1-4 = Minimal depression, 5-9 = Mild depression, 10-14 = Moderate depression, 15-19 = Moderately severe depression, 20-27 = Severe depression   Psychosocial Evaluation and Intervention:  Psychosocial Evaluation - 11/12/22 1400       Psychosocial Evaluation & Interventions   Interventions Encouraged to exercise with the program and follow exercise prescription    Comments Billey Gosling has no barriers to attending the program.  He wants to improve his strength upper and lower body. He has lost 8-10 lbs and wants to gain some back. He lives with his wife,Penny. She and their daughter Leotis Shames are his support as well as friends are being helpful. He is ready to start the program.    Expected Outcomes STG  attends all scheduled sessions and is able to workon exercise progression. LTG COntinues to work on his exercise progression after discharge.    Continue Psychosocial Services  Follow up required by staff  Psychosocial Re-Evaluation:  Psychosocial Re-Evaluation     Row Name 12/17/22 1356             Psychosocial Re-Evaluation   Current issues with Current Sleep Concerns       Comments Billey Gosling is doing well in rehab.  He is generally a laid back person and tries not to let people and stress get to him too much.   He continues to have issues with sleeping but will get at least 6-7 hours each day. Sleep has been a chronic concern.       Expected Outcomes Short: Continue to exercise  for mental boost Long: Continue to stay positive       Interventions Encouraged to attend Cardiac Rehabilitation for the exercise       Continue Psychosocial Services  Follow up required by staff                Psychosocial Discharge (Final Psychosocial Re-Evaluation):  Psychosocial Re-Evaluation - 12/17/22 1356       Psychosocial Re-Evaluation   Current issues with Current Sleep Concerns    Comments Billey Gosling is doing well in rehab.  He is generally a laid back person and tries not to let people and stress get to him too much.  He continues to have issues with sleeping but will get at least 6-7 hours each day. Sleep has been a chronic concern.    Expected Outcomes Short: Continue to exercise  for mental boost Long: Continue to stay positive    Interventions Encouraged to attend Cardiac Rehabilitation for the exercise    Continue Psychosocial Services  Follow up required by staff             Vocational Rehabilitation: Provide vocational rehab assistance to qualifying candidates.   Vocational Rehab Evaluation & Intervention:  Vocational Rehab - 11/12/22 1353       Initial Vocational Rehab Evaluation & Intervention   Assessment shows need for Vocational Rehabilitation No      Vocational Rehab Re-Evaulation   Comments retired      Discharge Vocational Rehab   Discharge Vocational Rehabilitation retired             Education: Education Goals: Education classes will be provided on a variety of topics geared toward better understanding of heart health and risk factor modification. Participant will state understanding/return demonstration of topics presented as noted by education test scores.  Learning Barriers/Preferences:  Learning Barriers/Preferences - 11/12/22 1346       Learning  Barriers/Preferences   Learning Barriers Hearing   has high pitch ringing in his ear, may dull hearing   Learning Preferences Video;Written Material;Individual Instruction             General Cardiac Education Topics:  AED/CPR: - Group verbal and written instruction with the use of models to demonstrate the basic use of the AED with the basic ABC's of resuscitation.   Anatomy and Cardiac Procedures: - Group verbal and visual presentation and models provide information about basic cardiac anatomy and function. Reviews the testing methods done to diagnose heart disease and the outcomes of the test results. Describes the treatment choices: Medical Management, Angioplasty, or Coronary Bypass Surgery for treating various heart conditions including Myocardial Infarction, Angina, Valve Disease, and Cardiac Arrhythmias.  Written material given at graduation. Flowsheet Row Cardiac Rehab from 12/02/2022 in [facility] Cardiac and Pulmonary Rehab  Education need identified 11/18/22       Medication Safety: - Group verbal and visual instruction to review  commonly prescribed medications for heart and lung disease. Reviews the medication, class of the drug, and side effects. Includes the steps to properly store meds and maintain the prescription regimen.  Written material given at graduation.   Intimacy: - Group verbal instruction through game format to discuss how heart and lung disease can affect sexual intimacy. Written material given at graduation..   Know Your Numbers and Heart Failure: - Group verbal and visual instruction to discuss disease risk factors for cardiac and pulmonary disease and treatment options.  Reviews associated critical values for Overweight/Obesity, Hypertension, Cholesterol, and Diabetes.  Discusses basics of heart failure: signs/symptoms and treatments.  Introduces Heart Failure Zone chart for action plan for heart failure.  Written material given at graduation. Flowsheet Row  Cardiac Rehab from 12/02/2022 in [facility] Cardiac and Pulmonary Rehab  Date 11/25/22  Educator MS  Instruction Review Code 1- Verbalizes Understanding       Infection Prevention: - Provides verbal and written material to individual with discussion of infection control including proper hand washing and proper equipment cleaning during exercise session. Flowsheet Row Cardiac Rehab from 12/02/2022 in [facility] Cardiac and Pulmonary Rehab  Date 11/18/22  Educator NT  Instruction Review Code 1- Verbalizes Understanding       Falls Prevention: - Provides verbal and written material to individual with discussion of falls prevention and safety. Flowsheet Row Cardiac Rehab from 12/02/2022 in [facility] Cardiac and Pulmonary Rehab  Date 11/12/22  Educator SB  Instruction Review Code 1- Verbalizes Understanding       Other: -Provides group and verbal instruction on various topics (see comments)   Knowledge Questionnaire Score:  Knowledge Questionnaire Score - 11/18/22 1516       Knowledge Questionnaire Score   Pre Score 23/26             Core Components/Risk Factors/Patient Goals at Admission:  Personal Goals and Risk Factors at Admission - 11/12/22 1348       Core Components/Risk Factors/Patient Goals on Admission    Weight Management Yes    Intervention Weight Management: Develop a combined nutrition and exercise program designed to reach desired caloric intake, while maintaining appropriate intake of nutrient and fiber, sodium and fats, and appropriate energy expenditure required for the weight goal.;Weight Management: Provide education and appropriate resources to help participant work on and attain dietary goals.    Admit Weight 120 lb (54.4 kg)   lost 8-10 lbs in hospital   Goal Weight: Short Term 122 lb (55.3 kg)    Goal Weight: Long Term 130 lb (59 kg)    Expected Outcomes Short Term: Continue to assess and modify interventions until short term weight is achieved;Long Term:  Adherence to nutrition and physical activity/exercise program aimed toward attainment of established weight goal;Weight Gain: Understanding of general recommendations for a high calorie, high protein meal plan that promotes weight gain by distributing calorie intake throughout the day with the consumption for 4-5 meals, snacks, and/or supplements    Lipids Yes    Intervention Provide education and support for participant on nutrition & aerobic/resistive exercise along with prescribed medications to achieve LDL 70mg , HDL >40mg .    Expected Outcomes Short Term: Participant states understanding of desired cholesterol values and is compliant with medications prescribed. Participant is following exercise prescription and nutrition guidelines.;Long Term: Cholesterol controlled with medications as prescribed, with individualized exercise RX and with personalized nutrition plan. Value goals: LDL < 70mg , HDL > 40 mg.  Education:Diabetes - Individual verbal and written instruction to review signs/symptoms of diabetes, desired ranges of glucose level fasting, after meals and with exercise. Acknowledge that pre and post exercise glucose checks will be done for 3 sessions at entry of program.   Core Components/Risk Factors/Patient Goals Review:   Goals and Risk Factor Review     Row Name 12/17/22 1404             Core Components/Risk Factors/Patient Goals Review   Personal Goals Review Weight Management/Obesity       Review Billey Gosling is doing well in rehab.  His weight is starting to come back up and he is eating protein to help.  He will check his pressures are home when he feels like it is off.       Expected Outcomes Short: Continue to work on weight gain Long: Continue to monitor risk factors                Core Components/Risk Factors/Patient Goals at Discharge (Final Review):   Goals and Risk Factor Review - 12/17/22 1404       Core Components/Risk Factors/Patient Goals  Review   Personal Goals Review Weight Management/Obesity    Review Billey Gosling is doing well in rehab.  His weight is starting to come back up and he is eating protein to help.  He will check his pressures are home when he feels like it is off.    Expected Outcomes Short: Continue to work on weight gain Long: Continue to monitor risk factors             ITP Comments:  ITP Comments     Row Name 11/12/22 1404 11/18/22 1506 11/19/22 1341 12/02/22 1205 12/30/22 1142   ITP Comments Virtual orientation call completed today. he has an appointment on Date: 11/18/2022  for EP eval and gym Orientation.  Documentation of diagnosis can be found in [facility]  Date: 10/03/2022 . Completed and gym orientation. Initial ITP created and sent for review to Dr. Bethann Punches, Medical Director. First full day of exercise!  Patient was oriented to gym and equipment including functions, settings, policies, and procedures.  Patient's individual exercise prescription and treatment plan were reviewed.  All starting workloads were established based on the results of the 6 minute walk test done at initial orientation visit.  The plan for exercise progression was also introduced and progression will be customized based on patient's performance and goals. 30 Day review completed. Medical Director ITP review done, changes made as directed, and signed approval by Medical Director.   new to program 30 Day review completed. Medical Director ITP review done, changes made as directed, and signed approval by Medical Director.            Comments:

## 2022-12-31 ENCOUNTER — Encounter: Payer: PPO | Admitting: *Deleted

## 2022-12-31 DIAGNOSIS — Z951 Presence of aortocoronary bypass graft: Secondary | ICD-10-CM | POA: Diagnosis not present

## 2022-12-31 NOTE — Progress Notes (Signed)
Daily Session Note  Patient Details  Name: Ruben [name] MRN: [id number] Date of Birth: April 23, 1946 Referring Provider:   Flowsheet Row Cardiac Rehab from 11/18/2022 in [facility] Cardiac and Pulmonary Rehab  Referring Provider Dr. Marcina Millard, MD       Encounter Date: 12/31/2022  Check In:  Session Check In - 12/31/22 1333       Check-In   Supervising physician immediately available to respond to emergencies See telemetry face sheet for immediately available ER MD    Location [facility]-Cardiac & Pulmonary Rehab    Staff Present Susann Givens, RN BSN;Joseph Deshler, RCP,RRT,BSRT;Jessica Conyngham, [place], RCEP, CCRP, CCET    Virtual Visit No    Medication changes reported     No    Fall or balance concerns reported    No    Warm-up and Cool-down Performed on first and last piece of equipment    Resistance Training Performed Yes    VAD Patient? No    PAD/SET Patient? No      Pain Assessment   Currently in Pain? No/denies                Social History   Tobacco Use  Smoking Status Never  Smokeless Tobacco Never    Goals Met:  Independence with exercise equipment Exercise tolerated well No report of concerns or symptoms today Strength training completed today  Goals Unmet:  Not Applicable  Comments: Pt able to follow exercise prescription today without complaint.  Will continue to monitor for progression.    Dr. Bethann Punches is Medical Director for [facility] Cardiac Rehabilitation.  Dr. Vida Rigger is Medical Director for [facility] Pulmonary Rehabilitation.

## 2023-01-01 ENCOUNTER — Other Ambulatory Visit: Payer: Self-pay | Admitting: Surgical

## 2023-01-01 ENCOUNTER — Other Ambulatory Visit: Payer: Self-pay | Admitting: Surgery

## 2023-01-04 ENCOUNTER — Encounter: Payer: PPO | Admitting: *Deleted

## 2023-01-04 DIAGNOSIS — Z951 Presence of aortocoronary bypass graft: Secondary | ICD-10-CM | POA: Diagnosis not present

## 2023-01-04 NOTE — Progress Notes (Signed)
Daily Session Note  Patient Details  Name: Ruben [name] MRN: [id number] Date of Birth: 06-Dec-1945 Referring Provider:   Flowsheet Row Cardiac Rehab from 11/18/2022 in [facility] Cardiac and Pulmonary Rehab  Referring Provider Dr. Marcina Millard, MD       Encounter Date: 01/04/2023  Check In:  Session Check In - 01/04/23 1345       Check-In   Supervising physician immediately available to respond to emergencies See telemetry face sheet for immediately available ER MD    Location [facility]-Cardiac & Pulmonary Rehab    Staff Present Susann Givens, RN BSN;Joseph Reino Kent, Guinevere Ferrari, RN, [place]    Virtual Visit No    Medication changes reported     No    Fall or balance concerns reported    No    Warm-up and Cool-down Performed on first and last piece of equipment    Resistance Training Performed Yes    VAD Patient? No    PAD/SET Patient? No      Pain Assessment   Currently in Pain? No/denies                Social History   Tobacco Use  Smoking Status Never  Smokeless Tobacco Never    Goals Met:  Independence with exercise equipment Exercise tolerated well No report of concerns or symptoms today Strength training completed today  Goals Unmet:  Not Applicable  Comments: Pt able to follow exercise prescription today without complaint.  Will continue to monitor for progression.    Dr. Bethann Punches is Medical Director for [facility] Cardiac Rehabilitation.  Dr. Vida Rigger is Medical Director for [facility] Pulmonary Rehabilitation.

## 2023-01-07 ENCOUNTER — Encounter: Payer: PPO | Admitting: *Deleted

## 2023-01-07 DIAGNOSIS — Z951 Presence of aortocoronary bypass graft: Secondary | ICD-10-CM | POA: Diagnosis not present

## 2023-01-07 NOTE — Progress Notes (Signed)
Daily Session Note  Patient Details  Name: Ruben [name] MRN: [id number] Date of Birth: [birth year] Referring Provider:   Flowsheet Row Cardiac Rehab from 11/18/2022 in [facility] Cardiac and Pulmonary Rehab  Referring Provider Dr. Marcina Millard, MD       Encounter Date: 01/07/2023  Check In:  Session Check In - 01/07/23 1400       Check-In   Supervising physician immediately available to respond to emergencies See telemetry face sheet for immediately available ER MD    Location [facility]-Cardiac & Pulmonary Rehab    Staff Present Lanny Hurst, RN, ADN;Meredith Jewel Baize, RN BSN;Other   [place] Bigel[place], MS   Virtual Visit No    Medication changes reported     No    Fall or balance concerns reported    No    Warm-up and Cool-down Performed on first and last piece of equipment    Resistance Training Performed Yes    VAD Patient? No    PAD/SET Patient? No      Pain Assessment   Currently in Pain? No/denies                Social History   Tobacco Use  Smoking Status Never  Smokeless Tobacco Never    Goals Met:  Independence with exercise equipment Exercise tolerated well No report of concerns or symptoms today Strength training completed today  Goals Unmet:  Not Applicable  Comments: Pt able to follow exercise prescription today without complaint.  Will continue to monitor for progression.    Dr. Bethann Punches is Medical Director for [facility] Cardiac Rehabilitation.  Dr. Vida Rigger is Medical Director for [facility] Pulmonary Rehabilitation.

## 2023-01-11 ENCOUNTER — Encounter: Payer: PPO | Admitting: *Deleted

## 2023-01-11 DIAGNOSIS — Z951 Presence of aortocoronary bypass graft: Secondary | ICD-10-CM | POA: Diagnosis not present

## 2023-01-11 NOTE — Progress Notes (Signed)
Daily Session Note  Patient Details  Name: Ruben [name] MRN: [id number] Date of Birth: 1946/01/29 Referring Provider:   Flowsheet Row Cardiac Rehab from 11/18/2022 in [facility] Cardiac and Pulmonary Rehab  Referring Provider Dr. Marcina Millard, MD       Encounter Date: 01/11/2023  Check In:  Session Check In - 01/11/23 1350       Check-In   Supervising physician immediately available to respond to emergencies See telemetry face sheet for immediately available ER MD    Location [facility]-Cardiac & Pulmonary Rehab    Staff Present Susann Givens, RN BSN;Laureen Manson Passey, BS, RRT, CPFT;Megan Katrinka Blazing, RN, ADN    Virtual Visit No    Medication changes reported     No    Fall or balance concerns reported    No    Warm-up and Cool-down Performed on first and last piece of equipment    Resistance Training Performed Yes    VAD Patient? No    PAD/SET Patient? No      Pain Assessment   Currently in Pain? No/denies                Social History   Tobacco Use  Smoking Status Never  Smokeless Tobacco Never    Goals Met:  Independence with exercise equipment Exercise tolerated well No report of concerns or symptoms today Strength training completed today  Goals Unmet:  Not Applicable  Comments: Pt able to follow exercise prescription today without complaint.  Will continue to monitor for progression.    Dr. Bethann Punches is Medical Director for [facility] Cardiac Rehabilitation.  Dr. Vida Rigger is Medical Director for [facility] Pulmonary Rehabilitation.

## 2023-01-13 ENCOUNTER — Encounter: Payer: PPO | Admitting: *Deleted

## 2023-01-13 DIAGNOSIS — Z951 Presence of aortocoronary bypass graft: Secondary | ICD-10-CM | POA: Diagnosis not present

## 2023-01-13 NOTE — Progress Notes (Signed)
Daily Session Note  Patient Details  Name: Ruben [name] MRN: [id number] Date of Birth: 14-Aug-1945 Referring Provider:   Flowsheet Row Cardiac Rehab from 11/18/2022 in [facility] Cardiac and Pulmonary Rehab  Referring Provider Dr. Marcina Millard, MD       Encounter Date: 01/13/2023  Check In:  Session Check In - 01/13/23 1338       Check-In   Supervising physician immediately available to respond to emergencies See telemetry face sheet for immediately available ER MD    Location [facility]-Cardiac & Pulmonary Rehab    Staff Present Susann Givens, RN BSN;Laureen Manson Passey, BS, RRT, CPFT;Kelly Madilyn Fireman, BS, ACSM CEP, Exercise Physiologist    Virtual Visit No    Medication changes reported     No    Fall or balance concerns reported    No    Warm-up and Cool-down Performed on first and last piece of equipment    Resistance Training Performed Yes    VAD Patient? No    PAD/SET Patient? No      Pain Assessment   Currently in Pain? No/denies                Social History   Tobacco Use  Smoking Status Never  Smokeless Tobacco Never    Goals Met:  Independence with exercise equipment Exercise tolerated well No report of concerns or symptoms today Strength training completed today  Goals Unmet:  Not Applicable  Comments: Pt able to follow exercise prescription today without complaint.  Will continue to monitor for progression.    Dr. Bethann Punches is Medical Director for [facility] Cardiac Rehabilitation.  Dr. Vida Rigger is Medical Director for [facility] Pulmonary Rehabilitation.

## 2023-01-14 ENCOUNTER — Encounter: Payer: PPO | Admitting: *Deleted

## 2023-01-14 DIAGNOSIS — Z951 Presence of aortocoronary bypass graft: Secondary | ICD-10-CM

## 2023-01-14 NOTE — Progress Notes (Signed)
Daily Session Note  Patient Details  Name: Ruben [name] MRN: [id number] Date of Birth: 1946/06/12 Referring Provider:   Flowsheet Row Cardiac Rehab from 11/18/2022 in [facility] Cardiac and Pulmonary Rehab  Referring Provider Dr. Marcina Millard, MD       Encounter Date: 01/14/2023  Check In:  Session Check In - 01/14/23 1400       Check-In   Supervising physician immediately available to respond to emergencies See telemetry face sheet for immediately available ER MD    Location [facility]-Cardiac & Pulmonary Rehab    Staff Present Lanny Hurst, RN, ADN;Joseph [place], RCP,RRT,BSRT;Cora Collum, RN, BSN, CCRP    Virtual Visit No    Medication changes reported     No    Fall or balance concerns reported    No    Warm-up and Cool-down Performed on first and last piece of equipment    Resistance Training Performed Yes    VAD Patient? No    PAD/SET Patient? No      Pain Assessment   Currently in Pain? No/denies                Social History   Tobacco Use  Smoking Status Never  Smokeless Tobacco Never    Goals Met:  Independence with exercise equipment Exercise tolerated well No report of concerns or symptoms today Strength training completed today  Goals Unmet:  Not Applicable  Comments: Pt able to follow exercise prescription today without complaint.  Will continue to monitor for progression.    Dr. Bethann Punches is Medical Director for [facility] Cardiac Rehabilitation.  Dr. Vida Rigger is Medical Director for [facility] Pulmonary Rehabilitation.

## 2023-01-18 ENCOUNTER — Encounter: Payer: PPO | Attending: Cardiology | Admitting: *Deleted

## 2023-01-18 DIAGNOSIS — Z951 Presence of aortocoronary bypass graft: Secondary | ICD-10-CM | POA: Diagnosis not present

## 2023-01-18 NOTE — Progress Notes (Signed)
Daily Session Note  Patient Details  Name: Ruben [name] MRN: [id number] Date of Birth: [birth year] Referring Provider:   Flowsheet Row Cardiac Rehab from 11/18/2022 in [facility] Cardiac and Pulmonary Rehab  Referring Provider Dr. Marcina Millard, MD       Encounter Date: 01/18/2023  Check In:  Session Check In - 01/18/23 1429       Check-In   Supervising physician immediately available to respond to emergencies See telemetry face sheet for immediately available ER MD    Location [facility]-Cardiac & Pulmonary Rehab    Staff Present Elige Ko, RCP,RRT,BSRT;Jontue Crumpacker Jewel Baize, RN Atilano Median, RN, ADN    Virtual Visit No    Medication changes reported     No    Fall or balance concerns reported    No    Warm-up and Cool-down Performed on first and last piece of equipment    Resistance Training Performed Yes    VAD Patient? No    PAD/SET Patient? No      Pain Assessment   Currently in Pain? No/denies                Social History   Tobacco Use  Smoking Status Never  Smokeless Tobacco Never    Goals Met:  Independence with exercise equipment Exercise tolerated well No report of concerns or symptoms today Strength training completed today  Goals Unmet:  Not Applicable  Comments: Pt able to follow exercise prescription today without complaint.  Will continue to monitor for progression.    Dr. Bethann Punches is Medical Director for [facility] Cardiac Rehabilitation.  Dr. Vida Rigger is Medical Director for [facility] Pulmonary Rehabilitation.

## 2023-01-20 ENCOUNTER — Encounter: Payer: PPO | Admitting: *Deleted

## 2023-01-20 DIAGNOSIS — Z951 Presence of aortocoronary bypass graft: Secondary | ICD-10-CM | POA: Diagnosis not present

## 2023-01-20 DIAGNOSIS — G459 Transient cerebral ischemic attack, unspecified: Secondary | ICD-10-CM | POA: Diagnosis not present

## 2023-01-20 DIAGNOSIS — I480 Paroxysmal atrial fibrillation: Secondary | ICD-10-CM | POA: Diagnosis not present

## 2023-01-20 DIAGNOSIS — I25721 Atherosclerosis of autologous artery coronary artery bypass graft(s) with angina pectoris with documented spasm: Secondary | ICD-10-CM | POA: Diagnosis not present

## 2023-01-20 DIAGNOSIS — I214 Non-ST elevation (NSTEMI) myocardial infarction: Secondary | ICD-10-CM | POA: Diagnosis not present

## 2023-01-20 DIAGNOSIS — E7800 Pure hypercholesterolemia, unspecified: Secondary | ICD-10-CM | POA: Diagnosis not present

## 2023-01-20 NOTE — Progress Notes (Signed)
Daily Session Note  Patient Details  Name: Ruben [name] MRN: [id number] Date of Birth: October 18, 1945 Referring Provider:   Flowsheet Row Cardiac Rehab from 11/18/2022 in [facility] Cardiac and Pulmonary Rehab  Referring Provider Dr. Marcina Millard, MD       Encounter Date: 01/20/2023  Check In:  Session Check In - 01/20/23 1334       Check-In   Supervising physician immediately available to respond to emergencies See telemetry face sheet for immediately available ER MD    Location [facility]-Cardiac & Pulmonary Rehab    Staff Present Susann Givens, RN BSN;Laureen Manson Passey, BS, RRT, CPFT;Kelly Madilyn Fireman, BS, ACSM CEP, Exercise Physiologist    Virtual Visit No    Medication changes reported     No    Fall or balance concerns reported    No    Warm-up and Cool-down Performed on first and last piece of equipment    Resistance Training Performed Yes    VAD Patient? No    PAD/SET Patient? No      Pain Assessment   Currently in Pain? No/denies                Social History   Tobacco Use  Smoking Status Never  Smokeless Tobacco Never    Goals Met:  Independence with exercise equipment Exercise tolerated well No report of concerns or symptoms today Strength training completed today  Goals Unmet:  Not Applicable  Comments: Pt able to follow exercise prescription today without complaint.  Will continue to monitor for progression.    Dr. Bethann Punches is Medical Director for [facility] Cardiac Rehabilitation.  Dr. Vida Rigger is Medical Director for [facility] Pulmonary Rehabilitation.

## 2023-01-25 ENCOUNTER — Encounter: Payer: PPO | Admitting: *Deleted

## 2023-01-25 DIAGNOSIS — Z951 Presence of aortocoronary bypass graft: Secondary | ICD-10-CM

## 2023-01-25 NOTE — Progress Notes (Signed)
Daily Session Note  Patient Details  Name: Ruben [name] MRN: [id number] Date of Birth: [birth year] Referring Provider:   Flowsheet Row Cardiac Rehab from 11/18/2022 in [facility]  Referring Provider Dr. Marcina Millard, MD       Encounter Date: 01/25/2023  Check In:  Session Check In - 01/25/23 1517       Check-In   Supervising physician immediately available to respond to emergencies See telemetry face sheet for immediately available ER MD    Location [facility]-Cardiac & Pulmonary Rehab    Staff Present Cora Collum, RN, BSN, CCRP;Laureen Manson Passey, BS, RRT, CPFT;Meredith Jewel Baize, RN BSN    Virtual Visit No    Medication changes reported     No    Fall or balance concerns reported    No    Warm-up and Cool-down Performed on first and last piece of equipment    Resistance Training Performed Yes    VAD Patient? No    PAD/SET Patient? No      Pain Assessment   Currently in Pain? No/denies                Social History   Tobacco Use  Smoking Status Never  Smokeless Tobacco Never    Goals Met:  Independence with exercise equipment Exercise tolerated well No report of concerns or symptoms today  Goals Unmet:  Not Applicable  Comments: Pt able to follow exercise prescription today without complaint.  Will continue to monitor for progression.    Dr. Bethann Punches is Medical Director for [facility] Cardiac Rehabilitation.  Dr. Vida Rigger is Medical Director for [facility] Pulmonary Rehabilitation.

## 2023-01-26 ENCOUNTER — Encounter: Payer: Self-pay | Admitting: *Deleted

## 2023-01-26 DIAGNOSIS — Z951 Presence of aortocoronary bypass graft: Secondary | ICD-10-CM

## 2023-01-26 NOTE — Progress Notes (Signed)
Cardiac Individual Treatment Plan  Patient Details  Name: Ruben [name] MRN: [id number] Date of Birth: 02-11-1946 Referring Provider:   Flowsheet Row Cardiac Rehab from 11/18/2022 in [facility] Cardiac and Pulmonary Rehab  Referring Provider Dr. Marcina Millard, MD       Initial Encounter Date:  Flowsheet Row Cardiac Rehab from 11/18/2022 in [facility] Cardiac and Pulmonary Rehab  Date 11/18/22       Visit Diagnosis: S/P CABG x 3  Patient's Home Medications on Admission:  Current Outpatient Medications:    amiodarone (PACERONE) 200 MG tablet, Take 1 tablet (200 mg total) by mouth 2 (two) times daily. For 7 days then reduce the dose to 1 tablet (200 mg) twice daily., Disp: 60 tablet, Rfl: 2   apixaban (ELIQUIS) 5 MG TABS tablet, Take 1 tablet (5 mg total) by mouth 2 (two) times daily., Disp: 60 tablet, Rfl: 1   aspirin EC 81 MG tablet, Take 1 tablet (81 mg total) by mouth daily. Swallow whole., Disp: 30 tablet, Rfl: 12   cholecalciferol (VITAMIN D3) 25 MCG (1000 UNIT) tablet, Take 1,000 Units by mouth daily., Disp: , Rfl:    metoprolol succinate (TOPROL-XL) 25 MG 24 hr tablet, Take 1 tablet (25 mg total) by mouth daily., Disp: 30 tablet, Rfl: 1   Polyvinyl Alcohol-Povidone (REFRESH OP), Place 1 drop into both eyes as needed (dry eye)., Disp: , Rfl:    rosuvastatin (CRESTOR) 20 MG tablet, Take 2 tablets (40 mg total) by mouth at bedtime., Disp: 60 tablet, Rfl: 11   vitamin B-12 (CYANOCOBALAMIN) 1000 MCG tablet, Take 1,000 mcg by mouth daily., Disp: , Rfl:    vitamin C (ASCORBIC ACID) 250 MG tablet, Take 250 mg by mouth daily., Disp: , Rfl:   Past Medical History: Past Medical History:  Diagnosis Date   TIA (transient ischemic attack)     Tobacco Use: Social History   Tobacco Use  Smoking Status Never  Smokeless Tobacco Never    Labs: Review Flowsheet  More data may exist      Latest Ref Rng & Units 11/03/2013 10/29/2020 10/02/2022 10/04/2022 10/05/2022  Labs for ITP Cardiac and  Pulmonary Rehab  Cholestrol 0 - 200 mg/dL 409  811  - - -  LDL (calc) 0 - 99 mg/dL [phone]  782  - - -  HDL-C >40 mg/dL 31  34  - - -  Trlycerides <150 mg/dL 956  213  - - -  Hemoglobin A1c 4.8 - 5.6 % 5.3  5.1  5.4  5.3  -  PH, Arterial 7.35 - 7.45 - - - 7.43  7.392  7.394  7.452  7.464  7.413   PCO2 arterial 32 - 48 mmHg - - - 39  31.4  33.2  32.7  30.4  38.3   Bicarbonate 20.0 - 28.0 mmol/L - - - 25.9  19.1  20.5  22.8  21.8  25.4  24.4   TCO2 22 - 32 mmol/L - - - - 20  22  24  24  23  26  27  26  29  26    Acid-base deficit 0.0 - 2.0 mmol/L - - - - 5.0  4.0  1.0  2.0   O2 Saturation % - - - 98.1  100  100  100  100  81  100      Exercise Target Goals: Exercise Program Goal: Individual exercise prescription set using results from initial 6 min walk test and THRR while considering  patient's activity barriers  and safety.   Exercise Prescription Goal: Initial exercise prescription builds to 30-45 minutes a day of aerobic activity, 2-3 days per week.  Home exercise guidelines will be given to patient during program as part of exercise prescription that the participant will acknowledge.   Education: Aerobic Exercise: - Group verbal and visual presentation on the components of exercise prescription. Introduces F.I.T.T principle from ACSM for exercise prescriptions.  Reviews F.I.T.T. principles of aerobic exercise including progression. Written material given at graduation. Flowsheet Row Cardiac Rehab from 12/02/2022 in [facility] Cardiac and Pulmonary Rehab  Education need identified 11/18/22       Education: Resistance Exercise: - Group verbal and visual presentation on the components of exercise prescription. Introduces F.I.T.T principle from ACSM for exercise prescriptions  Reviews F.I.T.T. principles of resistance exercise including progression. Written material given at graduation.    Education: Exercise & Equipment Safety: - Individual verbal instruction and demonstration of equipment use  and safety with use of the equipment. Flowsheet Row Cardiac Rehab from 12/02/2022 in [facility] Cardiac and Pulmonary Rehab  Date 11/18/22  Educator NT  Instruction Review Code 1- Verbalizes Understanding       Education: Exercise Physiology & General Exercise Guidelines: - Group verbal and written instruction with models to review the exercise physiology of the cardiovascular system and associated critical values. Provides general exercise guidelines with specific guidelines to those with heart or lung disease.    Education: Flexibility, Balance, Mind/Body Relaxation: - Group verbal and visual presentation with interactive activity on the components of exercise prescription. Introduces F.I.T.T principle from ACSM for exercise prescriptions. Reviews F.I.T.T. principles of flexibility and balance exercise training including progression. Also discusses the mind body connection.  Reviews various relaxation techniques to help reduce and manage stress (i.e. Deep breathing, progressive muscle relaxation, and visualization). Balance handout provided to take home. Written material given at graduation.   Activity Barriers & Risk Stratification:  Activity Barriers & Cardiac Risk Stratification - 11/18/22 1527       Activity Barriers & Cardiac Risk Stratification   Activity Barriers Other (comment)   R leg soreness   Comments R leg soreness    Cardiac Risk Stratification High             6 Minute Walk:  6 Minute Walk     Row Name 11/18/22 1526         6 Minute Walk   Phase Initial     Distance 1220 feet     Walk Time 6 minutes     # of Rest Breaks 0     MPH 2.31     METS 2.91     RPE 12     Perceived Dyspnea  0     VO2 Peak 10.2     Symptoms No     Resting HR 62 bpm     Resting BP 136/68     Resting Oxygen Saturation  99 %     Exercise Oxygen Saturation  during 6 min walk 100 %     Max Ex. HR 84 bpm     Max Ex. BP 144/70     2 Minute Post BP 128/70              Oxygen  Initial Assessment:   Oxygen Re-Evaluation:   Oxygen Discharge (Final Oxygen Re-Evaluation):   Initial Exercise Prescription:  Initial Exercise Prescription - 11/18/22 1500       Date of Initial Exercise RX and Referring Provider   Date 11/18/22  Referring Provider Dr. Marcina Millard, MD      Oxygen   Maintain Oxygen Saturation 88% or higher      Treadmill   MPH 2.1    Grade 0.5    Minutes 15    METs 2.75      Recumbant Bike   Level 2    RPM 50    Watts 17    Minutes 15    METs 2.91      NuStep   Level 3    SPM 80    Minutes 15    METs 2.91      REL-XR   Level 2    Speed 50    Minutes 15    METs 2.91      Prescription Details   Frequency (times per week) 3    Duration Progress to 30 minutes of continuous aerobic without signs/symptoms of physical distress      Intensity   THRR 40-80% of Max Heartrate 95-128    Ratings of Perceived Exertion 11-13    Perceived Dyspnea 0-4      Progression   Progression Continue to progress workloads to maintain intensity without signs/symptoms of physical distress.      Resistance Training   Training Prescription Yes    Weight 4 lb    Reps 10-15             Perform Capillary Blood Glucose checks as needed.  Exercise Prescription Changes:   Exercise Prescription Changes     Row Name 11/18/22 1500 11/24/22 0800 12/08/22 0800 12/17/22 1400 12/22/22 0800     Response to Exercise   Blood Pressure (Admit) 136/68 106/64 102/58 -- 124/70   Blood Pressure (Exercise) 144/70 150/74 140/80 -- --   Blood Pressure (Exit) 128/70 124/64 114/62 -- 112/60   Heart Rate (Admit) 62 bpm 56 bpm 65 bpm -- 87 bpm   Heart Rate (Exercise) 84 bpm 97 bpm 102 bpm -- 109 bpm   Heart Rate (Exit) 67 bpm 70 bpm 80 bpm -- 65 bpm   Oxygen Saturation (Admit) 99 % -- -- -- --   Oxygen Saturation (Exercise) 100 % -- -- -- --   Rating of Perceived Exertion (Exercise) 12 13 13  -- 13   Perceived Dyspnea (Exercise) 0 -- -- -- --    Symptoms none none none -- none   Comments Results 1st full day of exercise -- -- --   Duration -- Continue with 30 min of aerobic exercise without signs/symptoms of physical distress. Continue with 30 min of aerobic exercise without signs/symptoms of physical distress. -- Continue with 30 min of aerobic exercise without signs/symptoms of physical distress.   Intensity -- THRR unchanged THRR unchanged -- THRR unchanged     Progression   Progression -- Continue to progress workloads to maintain intensity without signs/symptoms of physical distress. Continue to progress workloads to maintain intensity without signs/symptoms of physical distress. -- Continue to progress workloads to maintain intensity without signs/symptoms of physical distress.   Average METs -- 2.45 3.31 -- 3.57     Resistance Training   Training Prescription -- Yes Yes -- Yes   Weight -- 4 lb 4 lb -- 4 lb   Reps -- 10-15 10-15 -- 10-15     Interval Training   Interval Training -- No No -- No     Recumbant Bike   Level -- -- 1 -- 5   Watts -- -- 13 -- --   Minutes -- --  15 -- 15   METs -- -- 2.7 -- --     NuStep   Level -- -- 4 -- 3   Minutes -- -- 15 -- 15   METs -- -- 3.5 -- 3.8     REL-XR   Level -- -- 4 -- --   Minutes -- -- 15 -- --   METs -- -- 5.5 -- --     Biostep-RELP   Level -- 2 -- -- --   Minutes -- 15 -- -- --   METs -- 2 -- -- --     Track   Laps -- 35 36 -- 43   Minutes -- 15 15 -- 15   METs -- 2.9 2.96 -- 3.34     Home Exercise Plan   Plans to continue exercise at -- -- -- Home (comment)  walking Home (comment)  walking   Frequency -- -- -- Add 2 additional days to program exercise sessions. Add 2 additional days to program exercise sessions.   Initial Home Exercises Provided -- -- -- 12/17/22 12/17/22     Oxygen   Maintain Oxygen Saturation -- 88% or higher 88% or higher -- 88% or higher    Row Name 01/05/23 1500 01/22/23 0700           Response to Exercise   Blood  Pressure (Admit) 112/58 118/56      Blood Pressure (Exit) 122/64 110/60      Heart Rate (Admit) 75 bpm 65 bpm      Heart Rate (Exercise) 101 bpm 76 bpm      Heart Rate (Exit) 70 bpm 70 bpm      Rating of Perceived Exertion (Exercise) 13 13      Symptoms none none      Duration Continue with 30 min of aerobic exercise without signs/symptoms of physical distress. Continue with 30 min of aerobic exercise without signs/symptoms of physical distress.      Intensity THRR unchanged THRR unchanged        Progression   Progression Continue to progress workloads to maintain intensity without signs/symptoms of physical distress. Continue to progress workloads to maintain intensity without signs/symptoms of physical distress.      Average METs 3.29 3.08        Resistance Training   Training Prescription Yes Yes      Weight 4 lb 5      Reps 10-15 10-15        Interval Training   Interval Training Yes Yes      Equipment REL-XR REL-XR      Comments Levels ranging from 5-8 Levels ranging from 5-8        NuStep   Level 3 --      Minutes 15 --      METs 3.6 --        REL-XR   Level 8  Intervals 8  intervals 6-8      Minutes 15 15      METs 4.1 --        Track   Laps 46 43      Minutes 15 15      METs 3.5 3.05        Home Exercise Plan   Plans to continue exercise at Home (comment)  walking Home (comment)  walking      Frequency Add 2 additional days to program exercise sessions. Add 2 additional days to program exercise sessions.      Initial  Home Exercises Provided 12/17/22 12/17/22        Oxygen   Maintain Oxygen Saturation 88% or higher 88% or higher               Exercise Comments:   Exercise Comments     Row Name 11/19/22 1341           Exercise Comments First full day of exercise!  Patient was oriented to gym and equipment including functions, settings, policies, and procedures.  Patient's individual exercise prescription and treatment plan were reviewed.  All  starting workloads were established based on the results of the 6 minute walk test done at initial orientation visit.  The plan for exercise progression was also introduced and progression will be customized based on patient's performance and goals.                Exercise Goals and Review:   Exercise Goals     Row Name 11/18/22 1528             Exercise Goals   Increase Physical Activity Yes       Intervention Provide advice, education, support and counseling about physical activity/exercise needs.;Develop an individualized exercise prescription for aerobic and resistive training based on initial evaluation findings, risk stratification, comorbidities and participant's personal goals.       Expected Outcomes Short Term: Attend rehab on a regular basis to increase amount of physical activity.;Long Term: Add in home exercise to make exercise part of routine and to increase amount of physical activity.;Long Term: Exercising regularly at least 3-5 days a week.       Increase Strength and Stamina Yes       Intervention Provide advice, education, support and counseling about physical activity/exercise needs.;Develop an individualized exercise prescription for aerobic and resistive training based on initial evaluation findings, risk stratification, comorbidities and participant's personal goals.       Expected Outcomes Short Term: Increase workloads from initial exercise prescription for resistance, speed, and METs.;Short Term: Perform resistance training exercises routinely during rehab and add in resistance training at home;Long Term: Improve cardiorespiratory fitness, muscular endurance and strength as measured by increased METs and functional capacity ( )       Able to understand and use rate of perceived exertion (RPE) scale Yes       Intervention Provide education and explanation on how to use RPE scale       Expected Outcomes Short Term: Able to use RPE daily in rehab to express  subjective intensity level;Long Term:  Able to use RPE to guide intensity level when exercising independently       Able to understand and use Dyspnea scale Yes       Intervention Provide education and explanation on how to use Dyspnea scale       Expected Outcomes Short Term: Able to use Dyspnea scale daily in rehab to express subjective sense of shortness of breath during exertion;Long Term: Able to use Dyspnea scale to guide intensity level when exercising independently       Knowledge and understanding of Target Heart Rate Range (THRR) Yes       Intervention Provide education and explanation of THRR including how the numbers were predicted and where they are located for reference       Expected Outcomes Long Term: Able to use THRR to govern intensity when exercising independently;Short Term: Able to use daily as guideline for intensity in rehab;Short Term: Able to state/look up THRR  Able to check pulse independently Yes       Intervention Review the importance of being able to check your own pulse for safety during independent exercise;Provide education and demonstration on how to check pulse in carotid and radial arteries.       Expected Outcomes Short Term: Able to explain why pulse checking is important during independent exercise;Long Term: Able to check pulse independently and accurately       Understanding of Exercise Prescription Yes       Intervention Provide education, explanation, and written materials on patient's individual exercise prescription       Expected Outcomes Short Term: Able to explain program exercise prescription;Long Term: Able to explain home exercise prescription to exercise independently                Exercise Goals Re-Evaluation :  Exercise Goals Re-Evaluation     Row Name 11/19/22 1341 11/24/22 0900 12/08/22 0901 12/17/22 1355 12/22/22 0854     Exercise Goal Re-Evaluation   Exercise Goals Review Able to understand and use rate of perceived exertion  (RPE) scale;Increase Physical Activity;Knowledge and understanding of Target Heart Rate Range (THRR);Understanding of Exercise Prescription;Increase Strength and Stamina;Able to check pulse independently Increase Physical Activity;Increase Strength and Stamina;Understanding of Exercise Prescription Increase Physical Activity;Increase Strength and Stamina;Understanding of Exercise Prescription Increase Physical Activity;Increase Strength and Stamina;Understanding of Exercise Prescription;Able to understand and use rate of perceived exertion (RPE) scale;Able to understand and use Dyspnea scale;Knowledge and understanding of Target Heart Rate Range (THRR);Able to check pulse independently Increase Physical Activity;Increase Strength and Stamina;Understanding of Exercise Prescription   Comments Reviewed RPE scale, THR and program prescription with pt today.  Pt voiced understanding and was given a copy of goals to take home. Charlie did well for his first session of exercise at rehab. He was able to reach 35 laps on the track and maintained appropriate RPEs. We will continue to monitor as he progresses in the program. Billey Gosling continues to do well in rehab. He increased his overall average MET level to 3.31 METs. He also improved to level 4 on the T4 nustep and XR. He has stayed consistent with the recumbent bike at level 1. He increased his laps on the track up to 36 laps as well. We will continue to monitor his progress in the program. Billey Gosling is doing well in rehab.  He is walking some on his own already. Reviewed home exercise with pt today.  Pt plans to continue to walk at home for exercise.  Reviewed THR, pulse, RPE, sign and symptoms, pulse oximetery and when to call 911 or MD.  Also discussed weather considerations and indoor options.  Pt voiced understanding. Billey Gosling is doing well in rehab.  He is up to 43 laps on the track!  He is also up to level 5 on the recumbent bike.  We will conitnue to monitor his  progress.   Expected Outcomes Short: Use RPE daily to regulate intensity.  Long: Follow program prescription in THR. Short: Continue to follow initial exercise prescription Long: Increase overall strength and stamina Short: Try level 2 on the recumbent bike. Long: Continue to improve strength and stamina. Short: Start to add in more exercise at home Long: Continue to exercise independently Short: Continue to aim for a mile each day on track Long: Continue to improve stamina    Row Name 01/05/23 1513 01/11/23 1643 01/22/23 0725 01/22/23 0729       Exercise Goal Re-Evaluation   Exercise Goals Review  Increase Physical Activity;Increase Strength and Stamina;Understanding of Exercise Prescription Increase Physical Activity;Increase Strength and Stamina Increase Physical Activity;Increase Strength and Stamina;Understanding of Exercise Prescription --    Comments Billey Gosling continues to do well in rehab. He recently increased his laps on the track to 46 laps. He also began interval training on the XR with intervals ranging between levels 5-8. He also continues to do well with 4 lb hand weights for resistance training. We will continue to monitor his progress. Billey Gosling continues to do well in rehab. He increased hand weights to 5 lbs recently and is doing well. He reports feeling incr in muscle strength. CHArlie is doing well with his exercise progression, he is doing intervals with the REXR up to level 8. He continues to increase his strength and stamina. Billey Gosling is doing well with his exercise progression, he is doing intervals with the REXR up to level 8. He continues to increase his strength and stamina. He is walkinf a mile on the track.  Will continue to monitor his exercise progression.    Expected Outcomes Short: Try 5 lb hand weights for resistance training. Long: Continue to improve strength and stamina. Short: Maintain progress made and continue with recent increases made  Long: Continue to improve strength  and stamina -- Short: Maintain progress made and continue with recent increases made  Long: Continue to improve strength and stamina             Discharge Exercise Prescription (Final Exercise Prescription Changes):  Exercise Prescription Changes - 01/22/23 0700       Response to Exercise   Blood Pressure (Admit) 118/56    Blood Pressure (Exit) 110/60    Heart Rate (Admit) 65 bpm    Heart Rate (Exercise) 76 bpm    Heart Rate (Exit) 70 bpm    Rating of Perceived Exertion (Exercise) 13    Symptoms none    Duration Continue with 30 min of aerobic exercise without signs/symptoms of physical distress.    Intensity THRR unchanged      Progression   Progression Continue to progress workloads to maintain intensity without signs/symptoms of physical distress.    Average METs 3.08      Resistance Training   Training Prescription Yes    Weight 5    Reps 10-15      Interval Training   Interval Training Yes    Equipment REL-XR    Comments Levels ranging from 5-8      REL-XR   Level 8   intervals 6-8   Minutes 15      Track   Laps 43    Minutes 15    METs 3.05      Home Exercise Plan   Plans to continue exercise at Home (comment)   walking   Frequency Add 2 additional days to program exercise sessions.    Initial Home Exercises Provided 12/17/22      Oxygen   Maintain Oxygen Saturation 88% or higher             Nutrition:  Target Goals: Understanding of nutrition guidelines, daily intake of sodium 1500mg , cholesterol 200mg , calories 30% from fat and 7% or less from saturated fats, daily to have 5 or more servings of fruits and vegetables.  Education: All About Nutrition: -Group instruction provided by verbal, written material, interactive activities, discussions, models, and posters to present general guidelines for heart healthy nutrition including fat, fiber, MyPlate, the role of sodium in heart healthy nutrition, utilization of the  nutrition label, and  utilization of this knowledge for meal planning. Follow up email sent as well. Written material given at graduation.   Biometrics:  Pre Biometrics - 11/18/22 1528       Pre Biometrics   Height 5' 8.5" (1.74 m)    Weight 131 lb (59.4 kg)    Waist Circumference 30 inches    Hip Circumference 34 inches    Waist to Hip Ratio 0.88 %    BMI (Calculated) 19.63    Single Leg Stand 30 seconds              Nutrition Therapy Plan and Nutrition Goals:  Nutrition Therapy & Goals - 01/18/23 1342       Nutrition Therapy   RD appointment deferred Yes             Nutrition Assessments:  MEDIFICTS Score Key: ?70 Need to make dietary changes  40-70 Heart Healthy Diet ? 40 Therapeutic Level Cholesterol Diet  Flowsheet Row Cardiac Rehab from 11/18/2022 in [facility] Cardiac and Pulmonary Rehab  Picture Your Plate Total Score on Admission 82      Picture Your Plate Scores: <62 Unhealthy dietary pattern with much room for improvement. 41-50 Dietary pattern unlikely to meet recommendations for good health and room for improvement. 51-60 More healthful dietary pattern, with some room for improvement.  >60 Healthy dietary pattern, although there may be some specific behaviors that could be improved.    Nutrition Goals Re-Evaluation:  Nutrition Goals Re-Evaluation     Row Name 12/17/22 1358 01/11/23 1634           Goals   Nutrition Goal Set up appt with dietitian Set up appt with dietitian      Comment Charlie eats a whole foods diet and has been doing so for 6 years.  He feels he has a good grasp but still wants to meet.  He does not eat processed food and uses beans for protein source. Charlie eats a whole foods diet and has been doing so for 6 years.  He feels he has a good grasp but still wants to meet.  He does not eat processed food and uses beans for protein source.      Expected Outcome Short: Set up appt with dietitian Long: Continue to eat heart healthy Short: Set up appt  with dietitian Long: Continue to eat heart healthy               Nutrition Goals Discharge (Final Nutrition Goals Re-Evaluation):  Nutrition Goals Re-Evaluation - 01/11/23 1634       Goals   Nutrition Goal Set up appt with dietitian    Comment Charlie eats a whole foods diet and has been doing so for 6 years.  He feels he has a good grasp but still wants to meet.  He does not eat processed food and uses beans for protein source.    Expected Outcome Short: Set up appt with dietitian Long: Continue to eat heart healthy             Psychosocial: Target Goals: Acknowledge presence or absence of significant depression and/or stress, maximize coping skills, provide positive support system. Participant is able to verbalize types and ability to use techniques and skills needed for reducing stress and depression.   Education: Stress, Anxiety, and Depression - Group verbal and visual presentation to define topics covered.  Reviews how body is impacted by stress, anxiety, and depression.  Also discusses healthy ways to reduce  stress and to treat/manage anxiety and depression.  Written material given at graduation.   Education: Sleep Hygiene -Provides group verbal and written instruction about how sleep can affect your health.  Define sleep hygiene, discuss sleep cycles and impact of sleep habits. Review good sleep hygiene tips.    Initial Review & Psychosocial Screening:  Initial Psych Review & Screening - 11/12/22 1340       Initial Review   Current issues with None Identified      Family Dynamics   Good Support System? Yes   wife Boyd Kerbs, daughter Leotis Shames,  friends     Barriers   Psychosocial barriers to participate in program There are no identifiable barriers or psychosocial needs.      Screening Interventions   Interventions Encouraged to exercise;To provide support and resources with identified psychosocial needs;Provide feedback about the scores to participant    Expected  Outcomes Short Term goal: Utilizing psychosocial counselor, staff and physician to assist with identification of specific Stressors or current issues interfering with healing process. Setting desired goal for each stressor or current issue identified.;Long Term Goal: Stressors or current issues are controlled or eliminated.;Short Term goal: Identification and review with participant of any Quality of Life or Depression concerns found by scoring the questionnaire.;Long Term goal: The participant improves quality of Life and PHQ9 Scores as seen by post scores and/or verbalization of changes             Quality of Life Scores:   Quality of Life - 11/18/22 1515       Quality of Life   Select Quality of Life      Quality of Life Scores   Health/Function Pre 27.5 %    Socioeconomic Pre 29.14 %    Psych/Spiritual Pre 30 %    Family Pre 25.2 %    GLOBAL Pre 28.01 %            Scores of 19 and below usually indicate a poorer quality of life in these areas.  A difference of  2-3 points is a clinically meaningful difference.  A difference of 2-3 points in the total score of the Quality of Life Index has been associated with significant improvement in overall quality of life, self-image, physical symptoms, and general health in studies assessing change in quality of life.  PHQ-9: Review Flowsheet       11/18/2022 05/13/2017  Depression screen PHQ 2/9  Decreased Interest 0 0  Down, Depressed, Hopeless 0 0  PHQ - 2 Score 0 0  Altered sleeping 0 -  Tired, decreased energy 1 -  Change in appetite 0 -  Feeling bad or failure about yourself  0 -  Trouble concentrating 0 -  Moving slowly or fidgety/restless 0 -  Suicidal thoughts 0 -  PHQ-9 Score 1 -  Difficult doing work/chores Not difficult at all -   Interpretation of Total Score  Total Score Depression Severity:  1-4 = Minimal depression, 5-9 = Mild depression, 10-14 = Moderate depression, 15-19 = Moderately severe depression, 20-27  = Severe depression   Psychosocial Evaluation and Intervention:  Psychosocial Evaluation - 11/12/22 1400       Psychosocial Evaluation & Interventions   Interventions Encouraged to exercise with the program and follow exercise prescription    Comments Billey Gosling has no barriers to attending the program.  He wants to improve his strength upper and lower body. He has lost 8-10 lbs and wants to gain some back. He lives with his wife,Penny.  She and their daughter Leotis Shames are his support as well as friends are being helpful. He is ready to start the program.    Expected Outcomes STG  attends all scheduled sessions and is able to workon exercise progression. LTG COntinues to work on his exercise progression after discharge.    Continue Psychosocial Services  Follow up required by staff             Psychosocial Re-Evaluation:  Psychosocial Re-Evaluation     Row Name 12/17/22 1356 01/11/23 1635           Psychosocial Re-Evaluation   Current issues with Current Sleep Concerns Current Sleep Concerns      Comments Billey Gosling is doing well in rehab.  He is generally a laid back person and tries not to let people and stress get to him too much.  He continues to have issues with sleeping but will get at least 6-7 hours each day. Sleep has been a chronic concern. Charlie reports some improvement in sleep. "I am now sleeping 8 hours some nights, other nights 6-7 hours." Billey Gosling has tried Rx sleep aids that were unsuccessful. He uses Tylenol PM as needed. 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 He has discussed these concerns with ENT. He expresses that he is pleased with the improvement in sleep that he has had and will discuss any further concerns with ENT or PCP moving forward.      Expected Outcomes Short: Continue to exercise  for mental boost Long: Continue to stay positive Short: Continue to exercise to improve  sleep  Long: Recognize any need to discuss new sleep concerns with MD      Interventions Encouraged to attend Cardiac Rehabilitation for the exercise Encouraged to attend Cardiac Rehabilitation for the exercise      Continue Psychosocial Services  Follow up required by staff Follow up required by staff               Psychosocial Discharge (Final Psychosocial Re-Evaluation):  Psychosocial Re-Evaluation - 01/11/23 1635       Psychosocial Re-Evaluation   Current issues with Current Sleep Concerns    Comments Billey Gosling reports some improvement in sleep. "I am now sleeping 8 hours some nights, other nights 6-7 hours." Billey Gosling has tried Rx sleep aids that were unsuccessful. He uses Tylenol PM as needed. 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 He has discussed these concerns with ENT. He expresses that he is pleased with the improvement in sleep that he has had and will discuss any further concerns with ENT or PCP moving forward.    Expected Outcomes Short: Continue to exercise to improve sleep  Long: Recognize any need to discuss new sleep concerns with MD    Interventions Encouraged to attend Cardiac Rehabilitation for the exercise    Continue Psychosocial Services  Follow up required by staff             Vocational Rehabilitation: Provide vocational rehab assistance to qualifying candidates.   Vocational Rehab Evaluation & Intervention:  Vocational Rehab - 11/12/22 1353       Initial Vocational Rehab Evaluation & Intervention   Assessment shows need for Vocational Rehabilitation No      Vocational Rehab Re-Evaulation   Comments retired      Discharge Vocational Rehab   Discharge  Vocational Rehabilitation retired             Education: Education Goals: Education classes will be provided on a variety of topics geared toward better understanding of heart health and risk factor  modification. Participant will state understanding/return demonstration of topics presented as noted by education test scores.  Learning Barriers/Preferences:  Learning Barriers/Preferences - 11/12/22 1346       Learning Barriers/Preferences   Learning Barriers Hearing   has high pitch ringing in his ear, may dull hearing   Learning Preferences Video;Written Material;Individual Instruction             General Cardiac Education Topics:  AED/CPR: - Group verbal and written instruction with the use of models to demonstrate the basic use of the AED with the basic ABC's of resuscitation.   Anatomy and Cardiac Procedures: - Group verbal and visual presentation and models provide information about basic cardiac anatomy and function. Reviews the testing methods done to diagnose heart disease and the outcomes of the test results. Describes the treatment choices: Medical Management, Angioplasty, or Coronary Bypass Surgery for treating various heart conditions including Myocardial Infarction, Angina, Valve Disease, and Cardiac Arrhythmias.  Written material given at graduation. Flowsheet Row Cardiac Rehab from 12/02/2022 in [facility] Cardiac and Pulmonary Rehab  Education need identified 11/18/22       Medication Safety: - Group verbal and visual instruction to review commonly prescribed medications for heart and lung disease. Reviews the medication, class of the drug, and side effects. Includes the steps to properly store meds and maintain the prescription regimen.  Written material given at graduation.   Intimacy: - Group verbal instruction through game format to discuss how heart and lung disease can affect sexual intimacy. Written material given at graduation..   Know Your Numbers and Heart Failure: - Group verbal and visual instruction to discuss disease risk factors for cardiac and pulmonary disease and treatment options.  Reviews associated critical values for Overweight/Obesity,  Hypertension, Cholesterol, and Diabetes.  Discusses basics of heart failure: signs/symptoms and treatments.  Introduces Heart Failure Zone chart for action plan for heart failure.  Written material given at graduation. Flowsheet Row Cardiac Rehab from 12/02/2022 in [facility] Cardiac and Pulmonary Rehab  Date 11/25/22  Educator MS  Instruction Review Code 1- Verbalizes Understanding       Infection Prevention: - Provides verbal and written material to individual with discussion of infection control including proper hand washing and proper equipment cleaning during exercise session. Flowsheet Row Cardiac Rehab from 12/02/2022 in [facility] Cardiac and Pulmonary Rehab  Date 11/18/22  Educator NT  Instruction Review Code 1- Verbalizes Understanding       Falls Prevention: - Provides verbal and written material to individual with discussion of falls prevention and safety. Flowsheet Row Cardiac Rehab from 12/02/2022 in [facility] Cardiac and Pulmonary Rehab  Date 11/12/22  Educator SB  Instruction Review Code 1- Verbalizes Understanding       Other: -Provides group and verbal instruction on various topics (see comments)   Knowledge Questionnaire Score:  Knowledge Questionnaire Score - 11/18/22 1516       Knowledge Questionnaire Score   Pre Score 23/26             Core Components/Risk Factors/Patient Goals at Admission:  Personal Goals and Risk Factors at Admission - 11/12/22 1348       Core Components/Risk Factors/Patient Goals on Admission    Weight Management Yes    Intervention Weight Management: Develop a combined nutrition and exercise  program designed to reach desired caloric intake, while maintaining appropriate intake of nutrient and fiber, sodium and fats, and appropriate energy expenditure required for the weight goal.;Weight Management: Provide education and appropriate resources to help participant work on and attain dietary goals.    Admit Weight 120 lb (54.4 kg)   lost 8-10  lbs in hospital   Goal Weight: Short Term 122 lb (55.3 kg)    Goal Weight: Long Term 130 lb (59 kg)    Expected Outcomes Short Term: Continue to assess and modify interventions until short term weight is achieved;Long Term: Adherence to nutrition and physical activity/exercise program aimed toward attainment of established weight goal;Weight Gain: Understanding of general recommendations for a high calorie, high protein meal plan that promotes weight gain by distributing calorie intake throughout the day with the consumption for 4-5 meals, snacks, and/or supplements    Lipids Yes    Intervention Provide education and support for participant on nutrition & aerobic/resistive exercise along with prescribed medications to achieve LDL 70mg , HDL >40mg .    Expected Outcomes Short Term: Participant states understanding of desired cholesterol values and is compliant with medications prescribed. Participant is following exercise prescription and nutrition guidelines.;Long Term: Cholesterol controlled with medications as prescribed, with individualized exercise RX and with personalized nutrition plan. Value goals: LDL < 70mg , HDL > 40 mg.             Education:Diabetes - Individual verbal and written instruction to review signs/symptoms of diabetes, desired ranges of glucose level fasting, after meals and with exercise. Acknowledge that pre and post exercise glucose checks will be done for 3 sessions at entry of program.   Core Components/Risk Factors/Patient Goals Review:   Goals and Risk Factor Review     Row Name 12/17/22 1404 01/11/23 1628           Core Components/Risk Factors/Patient Goals Review   Personal Goals Review Weight Management/Obesity Weight Management/Obesity;Lipids      Review Billey Gosling is doing well in rehab.  His weight is starting to come back up and he is eating protein to help.  He will check his pressures are home when he feels like it is off. Billey Gosling continues to do well in  rehab. His weight today is 134.8. He is happy to report noticing increase in his muscle strength. He would like to continue to work toward increasing weight to 140 lb. Billey Gosling will schedule and appt to meet with the dietician to discuss nutritional goals related to weight gain. Billey Gosling continues to be compliant with medicaitons including rosuvastatin 40 mg daily for lipid management. Billey Gosling thinks he is due for repeat lipid panel early July. Charile will follow up with MD to verify when labs are due.      Expected Outcomes Short: Continue to work on weight gain Long: Continue to monitor risk factors Short: Continue rehab sessions; meet with RD  Long: Maintain healthy weight and lipid control               Core Components/Risk Factors/Patient Goals at Discharge (Final Review):   Goals and Risk Factor Review - 01/11/23 1628       Core Components/Risk Factors/Patient Goals Review   Personal Goals Review Weight Management/Obesity;Lipids    Review Billey Gosling continues to do well in rehab. His weight today is 134.8. He is happy to report noticing increase in his muscle strength. He would like to continue to work toward increasing weight to 140 lb. Billey Gosling will schedule and appt to meet with  the dietician to discuss nutritional goals related to weight gain. Billey Gosling continues to be compliant with medicaitons including rosuvastatin 40 mg daily for lipid management. Billey Gosling thinks he is due for repeat lipid panel early July. Charile will follow up with MD to verify when labs are due.    Expected Outcomes Short: Continue rehab sessions; meet with RD  Long: Maintain healthy weight and lipid control             ITP Comments:  ITP Comments     Row Name 11/12/22 1404 11/18/22 1506 11/19/22 1341 12/02/22 1205 12/30/22 1142   ITP Comments Virtual orientation call completed today. he has an appointment on Date: 11/18/2022  for EP eval and gym Orientation.  Documentation of diagnosis [facility] be found in [facility]   Date: 10/03/2022 . Completed and gym orientation. Initial ITP created and sent for review to Dr. Bethann Punches, Medical Director. First full day of exercise!  Patient was oriented to gym and equipment including functions, settings, policies, and procedures.  Patient's individual exercise prescription and treatment plan were reviewed.  All starting workloads were established based on the results of the 6 minute walk test done at initial orientation visit.  The plan for exercise progression was also introduced and progression will be customized based on patient's performance and goals. 30 Day review completed. Medical Director ITP review done, changes made as directed, and signed approval by Medical Director.   new to program 30 Day review completed. Medical Director ITP review done, changes made as directed, and signed approval by Medical Director.    Row Name 01/26/23 1516           ITP Comments 30 Day review completed. Medical Director ITP review done, changes made as directed, and signed approval by Medical Director.                Comments:

## 2023-01-27 ENCOUNTER — Encounter: Payer: PPO | Admitting: *Deleted

## 2023-01-27 DIAGNOSIS — Z951 Presence of aortocoronary bypass graft: Secondary | ICD-10-CM

## 2023-01-27 NOTE — Progress Notes (Signed)
Daily Session Note  Patient Details  Name: Ruben [name] MRN: [id number] Date of Birth: 12-19-1945 Referring Provider:   Flowsheet Row Cardiac Rehab from 11/18/2022 in [facility] Cardiac and Pulmonary Rehab  Referring Provider Dr. Marcina Millard, MD       Encounter Date: 01/27/2023  Check In:  Session Check In - 01/27/23 1335       Check-In   Supervising physician immediately available to respond to emergencies See telemetry face sheet for immediately available ER MD    Location [facility]-Cardiac & Pulmonary Rehab    Staff Present Susann Givens, RN BSN;Laureen Manson Passey, BS, RRT, CPFT;Joseph Reino [place], [place]    Virtual Visit No    Medication changes reported     No    Fall or balance concerns reported    No    Warm-up and Cool-down Performed on first and last piece of equipment    Resistance Training Performed Yes    VAD Patient? No    PAD/SET Patient? No      Pain Assessment   Currently in Pain? No/denies                Social History   Tobacco Use  Smoking Status Never  Smokeless Tobacco Never    Goals Met:  Independence with exercise equipment Exercise tolerated well No report of concerns or symptoms today Strength training completed today  Goals Unmet:  Not Applicable  Comments: Pt able to follow exercise prescription today without complaint.  Will continue to monitor for progression.    Dr. Bethann Punches is Medical Director for [facility] Cardiac Rehabilitation.  Dr. Vida Rigger is Medical Director for [facility] Pulmonary Rehabilitation.

## 2023-01-28 ENCOUNTER — Encounter: Payer: PPO | Admitting: *Deleted

## 2023-01-28 DIAGNOSIS — Z951 Presence of aortocoronary bypass graft: Secondary | ICD-10-CM | POA: Diagnosis not present

## 2023-01-28 NOTE — Progress Notes (Signed)
Daily Session Note  Patient Details  Name: Ruben [name] MRN: [id number] Date of Birth: 1945/12/27 Referring Provider:   Flowsheet Row Cardiac Rehab from 11/18/2022 in [facility] Cardiac and Pulmonary Rehab  Referring Provider Dr. Marcina Millard, MD       Encounter Date: 01/28/2023  Check In:  Session Check In - 01/28/23 1341       Check-In   Supervising physician immediately available to respond to emergencies See telemetry face sheet for immediately available ER MD    Location [facility]-Cardiac & Pulmonary Rehab    Staff Present Lanny Hurst, RN, ADN;Joseph Galax, RCP,RRT,BSRT;Other   Rory Percy, MS   Virtual Visit No    Medication changes reported     No    Fall or balance concerns reported    No    Warm-up and Cool-down Performed on first and last piece of equipment    Resistance Training Performed Yes    VAD Patient? No    PAD/SET Patient? No      Pain Assessment   Currently in Pain? No/denies                Social History   Tobacco Use  Smoking Status Never  Smokeless Tobacco Never    Goals Met:  Independence with exercise equipment Exercise tolerated well No report of concerns or symptoms today Strength training completed today  Goals Unmet:  Not Applicable  Comments: Pt able to follow exercise prescription today without complaint.  Will continue to monitor for progression.    Dr. Bethann Punches is Medical Director for [facility] Cardiac Rehabilitation.  Dr. Vida Rigger is Medical Director for [facility] Pulmonary Rehabilitation.

## 2023-02-01 ENCOUNTER — Encounter: Payer: PPO | Admitting: *Deleted

## 2023-02-01 DIAGNOSIS — Z951 Presence of aortocoronary bypass graft: Secondary | ICD-10-CM | POA: Diagnosis not present

## 2023-02-01 NOTE — Patient Instructions (Signed)
Discharge Patient Instructions  Patient Details  Name: Ruben [name] MRN: [id number] Date of Birth: [birth year] Referring Provider:  Kandyce Rud, MD   Number of Visits: 71  Reason for Discharge:  Patient reached a stable level of exercise. Patient independent in their exercise. Patient has met program and personal goals.  Diagnosis:  S/P CABG x 3  Initial Exercise Prescription:  Initial Exercise Prescription - 11/18/22 1500       Date of Initial Exercise RX and Referring Provider   Date 11/18/22    Referring Provider Dr. Marcina Millard, MD      Oxygen   Maintain Oxygen Saturation 88% or higher      Treadmill   MPH 2.1    Grade 0.5    Minutes 15    METs 2.75      Recumbant Bike   Level 2    RPM 50    Watts 17    Minutes 15    METs 2.91      NuStep   Level 3    SPM 80    Minutes 15    METs 2.91      REL-XR   Level 2    Speed 50    Minutes 15    METs 2.91      Prescription Details   Frequency (times per week) 3    Duration Progress to 30 minutes of continuous aerobic without signs/symptoms of physical distress      Intensity   THRR 40-80% of Max Heartrate 95-128    Ratings of Perceived Exertion 11-13    Perceived Dyspnea 0-4      Progression   Progression Continue to progress workloads to maintain intensity without signs/symptoms of physical distress.      Resistance Training   Training Prescription Yes    Weight 4 lb    Reps 10-15             Discharge Exercise Prescription (Final Exercise Prescription Changes):  Exercise Prescription Changes - 01/22/23 0700       Response to Exercise   Blood Pressure (Admit) 118/56    Blood Pressure (Exit) 110/60    Heart Rate (Admit) 65 bpm    Heart Rate (Exercise) 76 bpm    Heart Rate (Exit) 70 bpm    Rating of Perceived Exertion (Exercise) 13    Symptoms none    Duration Continue with 30 min of aerobic exercise without signs/symptoms of physical distress.    Intensity THRR  unchanged      Progression   Progression Continue to progress workloads to maintain intensity without signs/symptoms of physical distress.    Average METs 3.08      Resistance Training   Training Prescription Yes    Weight 5    Reps 10-15      Interval Training   Interval Training Yes    Equipment REL-XR    Comments Levels ranging from 5-8      REL-XR   Level 8   intervals 6-8   Minutes 15      Track   Laps 43    Minutes 15    METs 3.05      Home Exercise Plan   Plans to continue exercise at Home (comment)   walking   Frequency Add 2 additional days to program exercise sessions.    Initial Home Exercises Provided 12/17/22      Oxygen   Maintain Oxygen Saturation 88% or higher  Functional Capacity:  6 Minute Walk     Row Name 11/18/22 1526 02/01/23 1421       6 Minute Walk   Phase Initial Discharge    Distance 1220 feet 1400 feet    Distance % Change -- 14.8 %    Distance Feet Change -- 180 ft    Walk Time 6 minutes 6 minutes    # of Rest Breaks 0 0    MPH 2.31 2.65    METS 2.91 3.08    RPE 12 12    Perceived Dyspnea  0 0    VO2 Peak 10.2 10.76    Symptoms No No    Resting HR 62 bpm 55 bpm    Resting BP 136/68 140/62    Resting Oxygen Saturation  99 % 98 %    Exercise Oxygen Saturation  during 6 min walk 100 % 98 %    Max Ex. HR 84 bpm 75 bpm    Max Ex. BP 144/70 138/60    2 Minute Post BP 128/70 --            Nutrition & Weight - Outcomes:  Pre Biometrics - 11/18/22 1528       Pre Biometrics   Height 5' 8.5" (1.74 m)    Weight 131 lb (59.4 kg)    Waist Circumference 30 inches    Hip Circumference 34 inches    Waist to Hip Ratio 0.88 %    BMI (Calculated) 19.63    Single Leg Stand 30 seconds             Nutrition:  Nutrition Therapy & Goals - 01/18/23 1342       Nutrition Therapy   RD appointment deferred Yes

## 2023-02-01 NOTE — Progress Notes (Signed)
Daily Session Note  Patient Details  Name: Ruben [name] MRN: [id number] Date of Birth: 1945-12-06 Referring Provider:   Flowsheet Row Cardiac Rehab from 11/18/2022 in [facility] Cardiac and Pulmonary Rehab  Referring Provider Dr. Marcina Millard, MD       Encounter Date: 02/01/2023  Check In:  Session Check In - 02/01/23 1338       Check-In   Supervising physician immediately available to respond to emergencies See telemetry face sheet for immediately available ER MD    Location [facility]-Cardiac & Pulmonary Rehab    Staff Present Susann Givens, RN Atilano Median, RN, ADN;Laureen Manson Passey, BS, RRT, CPFT    Virtual Visit No    Medication changes reported     No    Fall or balance concerns reported    No    Warm-up and Cool-down Performed on first and last piece of equipment    Resistance Training Performed Yes    VAD Patient? No    PAD/SET Patient? No      Pain Assessment   Currently in Pain? No/denies                Social History   Tobacco Use  Smoking Status Never  Smokeless Tobacco Never    Goals Met:  Independence with exercise equipment Exercise tolerated well No report of concerns or symptoms today Strength training completed today  Goals Unmet:  Not Applicable  Comments: Pt able to follow exercise prescription today without complaint.  Will continue to monitor for progression.    Dr. Bethann Punches is Medical Director for [facility] Cardiac Rehabilitation.  Dr. Vida Rigger is Medical Director for [facility] Pulmonary Rehabilitation.

## 2023-02-03 ENCOUNTER — Encounter: Payer: PPO | Admitting: *Deleted

## 2023-02-03 DIAGNOSIS — Z951 Presence of aortocoronary bypass graft: Secondary | ICD-10-CM

## 2023-02-03 DIAGNOSIS — Z79899 Other long term (current) drug therapy: Secondary | ICD-10-CM | POA: Diagnosis not present

## 2023-02-03 DIAGNOSIS — E7800 Pure hypercholesterolemia, unspecified: Secondary | ICD-10-CM | POA: Diagnosis not present

## 2023-02-03 DIAGNOSIS — Z125 Encounter for screening for malignant neoplasm of prostate: Secondary | ICD-10-CM | POA: Diagnosis not present

## 2023-02-03 NOTE — Progress Notes (Signed)
Daily Session Note  Patient Details  Name: Ruben [name] MRN: [id number] Date of Birth: 07-21-1945 Referring Provider:   Flowsheet Row Cardiac Rehab from 11/18/2022 in [facility] Cardiac and Pulmonary Rehab  Referring Provider Dr. Marcina Millard, MD       Encounter Date: 02/03/2023  Check In:  Session Check In - 02/03/23 1347       Check-In   Supervising physician immediately available to respond to emergencies See telemetry face sheet for immediately available ER MD    Location [facility]-Cardiac & Pulmonary Rehab    Staff Present Susann Givens, RN BSN;Laureen Manson Passey, BS, RRT, CPFT;Kelly Madilyn Fireman, BS, ACSM CEP, Exercise Physiologist    Virtual Visit No    Medication changes reported     No    Fall or balance concerns reported    No    Warm-up and Cool-down Performed on first and last piece of equipment    Resistance Training Performed Yes    VAD Patient? No    PAD/SET Patient? No      Pain Assessment   Currently in Pain? No/denies                Social History   Tobacco Use  Smoking Status Never  Smokeless Tobacco Never    Goals Met:  Independence with exercise equipment Exercise tolerated well No report of concerns or symptoms today Strength training completed today  Goals Unmet:  Not Applicable  Comments: Pt able to follow exercise prescription today without complaint.  Will continue to monitor for progression.    Dr. Bethann Punches is Medical Director for [facility] Cardiac Rehabilitation.  Dr. Vida Rigger is Medical Director for [facility] Pulmonary Rehabilitation.

## 2023-02-04 ENCOUNTER — Encounter: Payer: PPO | Admitting: *Deleted

## 2023-02-04 DIAGNOSIS — Z951 Presence of aortocoronary bypass graft: Secondary | ICD-10-CM | POA: Diagnosis not present

## 2023-02-04 NOTE — Progress Notes (Signed)
Daily Session Note  Patient Details  Name: Ruben [name] MRN: [id number] Date of Birth: [birth year] Referring Provider:   Flowsheet Row Cardiac Rehab from 11/18/2022 in [facility] Cardiac and Pulmonary Rehab  Referring Provider Dr. Marcina Millard, MD       Encounter Date: 02/04/2023  Check In:  Session Check In - 02/04/23 1351       Check-In   Supervising physician immediately available to respond to emergencies See telemetry face sheet for immediately available ER MD    Location [facility]-Cardiac & Pulmonary Rehab    Staff Present Lanny Hurst, RN, ADN;Joseph Hood, RCP,RRT,BSRT;Other   Max Manya Silvas, BS   Virtual Visit No    Medication changes reported     No    Fall or balance concerns reported    No    Warm-up and Cool-down Performed on first and last piece of equipment    Resistance Training Performed Yes    VAD Patient? No    PAD/SET Patient? No      Pain Assessment   Currently in Pain? No/denies                Social History   Tobacco Use  Smoking Status Never  Smokeless Tobacco Never    Goals Met:  Independence with exercise equipment Exercise tolerated well No report of concerns or symptoms today Strength training completed today  Goals Unmet:  Not Applicable  Comments: Pt able to follow exercise prescription today without complaint.  Will continue to monitor for progression.    Dr. Bethann Punches is Medical Director for [facility] Cardiac Rehabilitation.  Dr. Vida Rigger is Medical Director for [facility] Pulmonary Rehabilitation.

## 2023-02-08 ENCOUNTER — Encounter: Payer: PPO | Admitting: *Deleted

## 2023-02-08 DIAGNOSIS — Z951 Presence of aortocoronary bypass graft: Secondary | ICD-10-CM | POA: Diagnosis not present

## 2023-02-08 NOTE — Progress Notes (Signed)
Daily Session Note  Patient Details  Name: RAMONTE MENA MRN: [id number] Date of Birth: April 06, 1946 Referring Provider:   Flowsheet Row Cardiac Rehab from 11/18/2022 in [facility] Cardiac and Pulmonary Rehab  Referring Provider Dr. Marcina Millard, MD       Encounter Date: 02/08/2023  Check In:  Session Check In - 02/08/23 1335       Check-In   Supervising physician immediately available to respond to emergencies See telemetry face sheet for immediately available ER MD    Location [facility]-Cardiac & Pulmonary Rehab    Staff Present Susann Givens, RN BSN;Laureen Manson Passey, BS, RRT, CPFT;Megan Katrinka Blazing, RN, ADN    Virtual Visit No    Medication changes reported     No    Fall or balance concerns reported    No    Warm-up and Cool-down Performed on first and last piece of equipment    Resistance Training Performed Yes    VAD Patient? No    PAD/SET Patient? No      Pain Assessment   Currently in Pain? No/denies                Social History   Tobacco Use  Smoking Status Never  Smokeless Tobacco Never    Goals Met:  Independence with exercise equipment Exercise tolerated well No report of concerns or symptoms today Strength training completed today  Goals Unmet:  Not Applicable  Comments: Pt able to follow exercise prescription today without complaint.  Will continue to monitor for progression.    Dr. Bethann Punches is Medical Director for [facility] Cardiac Rehabilitation.  Dr. Vida Rigger is Medical Director for [facility] Pulmonary Rehabilitation.

## 2023-02-09 DIAGNOSIS — D649 Anemia, unspecified: Secondary | ICD-10-CM | POA: Diagnosis not present

## 2023-02-09 DIAGNOSIS — N1831 Chronic kidney disease, stage 3a: Secondary | ICD-10-CM | POA: Diagnosis not present

## 2023-02-09 DIAGNOSIS — Z0000 Encounter for general adult medical examination without abnormal findings: Secondary | ICD-10-CM | POA: Diagnosis not present

## 2023-02-09 DIAGNOSIS — Z1331 Encounter for screening for depression: Secondary | ICD-10-CM | POA: Diagnosis not present

## 2023-02-09 DIAGNOSIS — D539 Nutritional anemia, unspecified: Secondary | ICD-10-CM | POA: Diagnosis not present

## 2023-02-10 ENCOUNTER — Encounter: Payer: PPO | Admitting: *Deleted

## 2023-02-10 DIAGNOSIS — Z951 Presence of aortocoronary bypass graft: Secondary | ICD-10-CM | POA: Diagnosis not present

## 2023-02-10 NOTE — Progress Notes (Signed)
Cardiac Individual Treatment Plan  Patient Details  Name: Ruben [name] MRN: [id number] Date of Birth: 03/19/1946 Referring Provider:   Flowsheet Row Cardiac Rehab from 11/18/2022 in [facility] Cardiac and Pulmonary Rehab  Referring Provider Dr. Marcina Millard, MD       Initial Encounter Date:  Flowsheet Row Cardiac Rehab from 11/18/2022 in [facility] Cardiac and Pulmonary Rehab  Date 11/18/22       Visit Diagnosis: S/P CABG x 3  Patient's Home Medications on Admission:  Current Outpatient Medications:    amiodarone (PACERONE) 200 MG tablet, Take 1 tablet (200 mg total) by mouth 2 (two) times daily. For 7 days then reduce the dose to 1 tablet (200 mg) twice daily., Disp: 60 tablet, Rfl: 2   apixaban (ELIQUIS) 5 MG TABS tablet, Take 1 tablet (5 mg total) by mouth 2 (two) times daily., Disp: 60 tablet, Rfl: 1   aspirin EC 81 MG tablet, Take 1 tablet (81 mg total) by mouth daily. Swallow whole., Disp: 30 tablet, Rfl: 12   cholecalciferol (VITAMIN D3) 25 MCG (1000 UNIT) tablet, Take 1,000 Units by mouth daily., Disp: , Rfl:    metoprolol succinate (TOPROL-XL) 25 MG 24 hr tablet, Take 1 tablet (25 mg total) by mouth daily., Disp: 30 tablet, Rfl: 1   Polyvinyl Alcohol-Povidone (REFRESH OP), Place 1 drop into both eyes as needed (dry eye)., Disp: , Rfl:    rosuvastatin (CRESTOR) 20 MG tablet, Take 2 tablets (40 mg total) by mouth at bedtime., Disp: 60 tablet, Rfl: 11   vitamin B-12 (CYANOCOBALAMIN) 1000 MCG tablet, Take 1,000 mcg by mouth daily., Disp: , Rfl:    vitamin C (ASCORBIC ACID) 250 MG tablet, Take 250 mg by mouth daily., Disp: , Rfl:   Past Medical History: Past Medical History:  Diagnosis Date   TIA (transient ischemic attack)     Tobacco Use: Social History   Tobacco Use  Smoking Status Never  Smokeless Tobacco Never    Labs: Review Flowsheet  More data may exist      Latest Ref Rng & Units 11/03/2013 10/29/2020 10/02/2022 10/04/2022 10/05/2022  Labs for ITP Cardiac and  Pulmonary Rehab  Cholestrol 0 - 200 mg/dL 782  956  - - -  LDL (calc) 0 - 99 mg/dL 213  086  - - -  HDL-C >40 mg/dL 31  34  - - -  Trlycerides <150 mg/dL 578  469  - - -  Hemoglobin A1c 4.8 - 5.6 % 5.3  5.1  5.4  5.3  -  PH, Arterial 7.35 - 7.45 - - - 7.43  7.392  7.394  7.452  7.464  7.413   PCO2 arterial 32 - 48 mmHg - - - 39  31.4  33.2  32.7  30.4  38.3   Bicarbonate 20.0 - 28.0 mmol/L - - - 25.9  19.1  20.5  22.8  21.8  25.4  24.4   TCO2 22 - 32 mmol/L - - - - 20  22  24  24  23  26  27  26  29  26    Acid-base deficit 0.0 - 2.0 mmol/L - - - - 5.0  4.0  1.0  2.0   O2 Saturation % - - - 98.1  100  100  100  100  81  100     Details       Multiple values from one day are sorted in reverse-chronological order          Exercise  Target Goals: Exercise Program Goal: Individual exercise prescription set using results from initial 6 min walk test and THRR while considering  patient's activity barriers and safety.   Exercise Prescription Goal: Initial exercise prescription builds to 30-45 minutes a day of aerobic activity, 2-3 days per week.  Home exercise guidelines will be given to patient during program as part of exercise prescription that the participant will acknowledge.   Education: Aerobic Exercise: - Group verbal and visual presentation on the components of exercise prescription. Introduces F.I.T.T principle from ACSM for exercise prescriptions.  Reviews F.I.T.T. principles of aerobic exercise including progression. Written material given at graduation. Flowsheet Row Cardiac Rehab from 12/02/2022 in [facility] Cardiac and Pulmonary Rehab  Education need identified 11/18/22       Education: Resistance Exercise: - Group verbal and visual presentation on the components of exercise prescription. Introduces F.I.T.T principle from ACSM for exercise prescriptions  Reviews F.I.T.T. principles of resistance exercise including progression. Written material given at graduation.     Education: Exercise & Equipment Safety: - Individual verbal instruction and demonstration of equipment use and safety with use of the equipment. Flowsheet Row Cardiac Rehab from 12/02/2022 in [facility] Cardiac and Pulmonary Rehab  Date 11/18/22  Educator NT  Instruction Review Code 1- Verbalizes Understanding       Education: Exercise Physiology & General Exercise Guidelines: - Group verbal and written instruction with models to review the exercise physiology of the cardiovascular system and associated critical values. Provides general exercise guidelines with specific guidelines to those with heart or lung disease.    Education: Flexibility, Balance, Mind/Body Relaxation: - Group verbal and visual presentation with interactive activity on the components of exercise prescription. Introduces F.I.T.T principle from ACSM for exercise prescriptions. Reviews F.I.T.T. principles of flexibility and balance exercise training including progression. Also discusses the mind body connection.  Reviews various relaxation techniques to help reduce and manage stress (i.e. Deep breathing, progressive muscle relaxation, and visualization). Balance handout provided to take home. Written material given at graduation.   Activity Barriers & Risk Stratification:  Activity Barriers & Cardiac Risk Stratification - 11/18/22 1527       Activity Barriers & Cardiac Risk Stratification   Activity Barriers Other (comment)   R leg soreness   Comments R leg soreness    Cardiac Risk Stratification High             6 Minute Walk:  6 Minute Walk     Row Name 11/18/22 1526 02/01/23 1421       6 Minute Walk   Phase Initial Discharge    Distance 1220 feet 1400 feet    Distance % Change -- 14.8 %    Distance Feet Change -- 180 ft    Walk Time 6 minutes 6 minutes    # of Rest Breaks 0 0    MPH 2.31 2.65    METS 2.91 3.08    RPE 12 12    Perceived Dyspnea  0 0    VO2 Peak 10.2 10.76    Symptoms No No     Resting HR 62 bpm 55 bpm    Resting BP 136/68 140/62    Resting Oxygen Saturation  99 % 98 %    Exercise Oxygen Saturation  during 6 min walk 100 % 98 %    Max Ex. HR 84 bpm 75 bpm    Max Ex. BP 144/70 138/60    2 Minute Post BP 128/70 --  Oxygen Initial Assessment:   Oxygen Re-Evaluation:   Oxygen Discharge (Final Oxygen Re-Evaluation):   Initial Exercise Prescription:  Initial Exercise Prescription - 11/18/22 1500       Date of Initial Exercise RX and Referring Provider   Date 11/18/22    Referring Provider Dr. Marcina Millard, MD      Oxygen   Maintain Oxygen Saturation 88% or higher      Treadmill   MPH 2.1    Grade 0.5    Minutes 15    METs 2.75      Recumbant Bike   Level 2    RPM 50    Watts 17    Minutes 15    METs 2.91      NuStep   Level 3    SPM 80    Minutes 15    METs 2.91      REL-XR   Level 2    Speed 50    Minutes 15    METs 2.91      Prescription Details   Frequency (times per week) 3    Duration Progress to 30 minutes of continuous aerobic without signs/symptoms of physical distress      Intensity   THRR 40-80% of Max Heartrate 95-128    Ratings of Perceived Exertion 11-13    Perceived Dyspnea 0-4      Progression   Progression Continue to progress workloads to maintain intensity without signs/symptoms of physical distress.      Resistance Training   Training Prescription Yes    Weight 4 lb    Reps 10-15             Perform Capillary Blood Glucose checks as needed.  Exercise Prescription Changes:   Exercise Prescription Changes     Row Name 11/18/22 1500 11/24/22 0800 12/08/22 0800 12/17/22 1400 12/22/22 0800     Response to Exercise   Blood Pressure (Admit) 136/68 106/64 102/58 -- 124/70   Blood Pressure (Exercise) 144/70 150/74 140/80 -- --   Blood Pressure (Exit) 128/70 124/64 114/62 -- 112/60   Heart Rate (Admit) 62 bpm 56 bpm 65 bpm -- 87 bpm   Heart Rate (Exercise) 84 bpm 97 bpm 102  bpm -- 109 bpm   Heart Rate (Exit) 67 bpm 70 bpm 80 bpm -- 65 bpm   Oxygen Saturation (Admit) 99 % -- -- -- --   Oxygen Saturation (Exercise) 100 % -- -- -- --   Rating of Perceived Exertion (Exercise) 12 13 13  -- 13   Perceived Dyspnea (Exercise) 0 -- -- -- --   Symptoms none none none -- none   Comments Results 1st full day of exercise -- -- --   Duration -- Continue with 30 min of aerobic exercise without signs/symptoms of physical distress. Continue with 30 min of aerobic exercise without signs/symptoms of physical distress. -- Continue with 30 min of aerobic exercise without signs/symptoms of physical distress.   Intensity -- THRR unchanged THRR unchanged -- THRR unchanged     Progression   Progression -- Continue to progress workloads to maintain intensity without signs/symptoms of physical distress. Continue to progress workloads to maintain intensity without signs/symptoms of physical distress. -- Continue to progress workloads to maintain intensity without signs/symptoms of physical distress.   Average METs -- 2.45 3.31 -- 3.57     Resistance Training   Training Prescription -- Yes Yes -- Yes   Weight -- 4 lb 4 lb -- 4 lb   Reps --  10-15 10-15 -- 10-15     Interval Training   Interval Training -- No No -- No     Recumbant Bike   Level -- -- 1 -- 5   Watts -- -- 13 -- --   Minutes -- -- 15 -- 15   METs -- -- 2.7 -- --     NuStep   Level -- -- 4 -- 3   Minutes -- -- 15 -- 15   METs -- -- 3.5 -- 3.8     REL-XR   Level -- -- 4 -- --   Minutes -- -- 15 -- --   METs -- -- 5.5 -- --     Biostep-RELP   Level -- 2 -- -- --   Minutes -- 15 -- -- --   METs -- 2 -- -- --     Track   Laps -- 35 36 -- 43   Minutes -- 15 15 -- 15   METs -- 2.9 2.96 -- 3.34     Home Exercise Plan   Plans to continue exercise at -- -- -- Home (comment)  walking Home (comment)  walking   Frequency -- -- -- Add 2 additional days to program exercise sessions. Add 2 additional days to  program exercise sessions.   Initial Home Exercises Provided -- -- -- 12/17/22 12/17/22     Oxygen   Maintain Oxygen Saturation -- 88% or higher 88% or higher -- 88% or higher    Row Name 01/05/23 1500 01/22/23 0700 02/03/23 1400         Response to Exercise   Blood Pressure (Admit) 112/58 118/56 126/48     Blood Pressure (Exit) 122/64 110/60 116/50     Heart Rate (Admit) 75 bpm 65 bpm 52 bpm     Heart Rate (Exercise) 101 bpm 76 bpm 81 bpm     Heart Rate (Exit) 70 bpm 70 bpm 64 bpm     Rating of Perceived Exertion (Exercise) 13 13 13      Symptoms none none none     Duration Continue with 30 min of aerobic exercise without signs/symptoms of physical distress. Continue with 30 min of aerobic exercise without signs/symptoms of physical distress. Continue with 30 min of aerobic exercise without signs/symptoms of physical distress.     Intensity THRR unchanged THRR unchanged THRR unchanged       Progression   Progression Continue to progress workloads to maintain intensity without signs/symptoms of physical distress. Continue to progress workloads to maintain intensity without signs/symptoms of physical distress. Continue to progress workloads to maintain intensity without signs/symptoms of physical distress.     Average METs 3.29 3.08 3.5       Resistance Training   Training Prescription Yes Yes Yes     Weight 4 lb 5 5 lb     Reps 10-15 10-15 10-15       Interval Training   Interval Training Yes Yes No     Equipment REL-XR REL-XR --     Comments Levels ranging from 5-8 Levels ranging from 5-8 --       NuStep   Level 3 -- 4     Minutes 15 -- 15     METs 3.6 -- 4       REL-XR   Level 8  Intervals 8  intervals 6-8 --     Minutes 15 15 --     METs 4.1 -- --       Track  Laps 46 43 45     Minutes 15 15 15      METs 3.5 3.05 3.45       Home Exercise Plan   Plans to continue exercise at Home (comment)  walking Home (comment)  walking Home (comment)  walking     Frequency Add  2 additional days to program exercise sessions. Add 2 additional days to program exercise sessions. Add 2 additional days to program exercise sessions.     Initial Home Exercises Provided 12/17/22 12/17/22 12/17/22       Oxygen   Maintain Oxygen Saturation 88% or higher 88% or higher 88% or higher              Exercise Comments:   Exercise Comments     Row Name 11/19/22 1341           Exercise Comments First full day of exercise!  Patient was oriented to gym and equipment including functions, settings, policies, and procedures.  Patient's individual exercise prescription and treatment plan were reviewed.  All starting workloads were established based on the results of the 6 minute walk test done at initial orientation visit.  The plan for exercise progression was also introduced and progression will be customized based on patient's performance and goals.                Exercise Goals and Review:   Exercise Goals     Row Name 11/18/22 1528             Exercise Goals   Increase Physical Activity Yes       Intervention Provide advice, education, support and counseling about physical activity/exercise needs.;Develop an individualized exercise prescription for aerobic and resistive training based on initial evaluation findings, risk stratification, comorbidities and participant's personal goals.       Expected Outcomes Short Term: Attend rehab on a regular basis to increase amount of physical activity.;Long Term: Add in home exercise to make exercise part of routine and to increase amount of physical activity.;Long Term: Exercising regularly at least 3-5 days a week.       Increase Strength and Stamina Yes       Intervention Provide advice, education, support and counseling about physical activity/exercise needs.;Develop an individualized exercise prescription for aerobic and resistive training based on initial evaluation findings, risk stratification, comorbidities and  participant's personal goals.       Expected Outcomes Short Term: Increase workloads from initial exercise prescription for resistance, speed, and METs.;Short Term: Perform resistance training exercises routinely during rehab and add in resistance training at home;Long Term: Improve cardiorespiratory fitness, muscular endurance and strength as measured by increased METs and functional capacity ( )       Able to understand and use rate of perceived exertion (RPE) scale Yes       Intervention Provide education and explanation on how to use RPE scale       Expected Outcomes Short Term: Able to use RPE daily in rehab to express subjective intensity level;Long Term:  Able to use RPE to guide intensity level when exercising independently       Able to understand and use Dyspnea scale Yes       Intervention Provide education and explanation on how to use Dyspnea scale       Expected Outcomes Short Term: Able to use Dyspnea scale daily in rehab to express subjective sense of shortness of breath during exertion;Long Term: Able to use Dyspnea scale to guide intensity level when  exercising independently       Knowledge and understanding of Target Heart Rate Range (THRR) Yes       Intervention Provide education and explanation of THRR including how the numbers were predicted and where they are located for reference       Expected Outcomes Long Term: Able to use THRR to govern intensity when exercising independently;Short Term: Able to use daily as guideline for intensity in rehab;Short Term: Able to state/look up THRR       Able to check pulse independently Yes       Intervention Review the importance of being able to check your own pulse for safety during independent exercise;Provide education and demonstration on how to check pulse in carotid and radial arteries.       Expected Outcomes Short Term: Able to explain why pulse checking is important during independent exercise;Long Term: Able to check pulse  independently and accurately       Understanding of Exercise Prescription Yes       Intervention Provide education, explanation, and written materials on patient's individual exercise prescription       Expected Outcomes Short Term: Able to explain program exercise prescription;Long Term: Able to explain home exercise prescription to exercise independently                Exercise Goals Re-Evaluation :  Exercise Goals Re-Evaluation     Row Name 11/19/22 1341 11/24/22 0900 12/08/22 0901 12/17/22 1355 12/22/22 0854     Exercise Goal Re-Evaluation   Exercise Goals Review Able to understand and use rate of perceived exertion (RPE) scale;Increase Physical Activity;Knowledge and understanding of Target Heart Rate Range (THRR);Understanding of Exercise Prescription;Increase Strength and Stamina;Able to check pulse independently Increase Physical Activity;Increase Strength and Stamina;Understanding of Exercise Prescription Increase Physical Activity;Increase Strength and Stamina;Understanding of Exercise Prescription Increase Physical Activity;Increase Strength and Stamina;Understanding of Exercise Prescription;Able to understand and use rate of perceived exertion (RPE) scale;Able to understand and use Dyspnea scale;Knowledge and understanding of Target Heart Rate Range (THRR);Able to check pulse independently Increase Physical Activity;Increase Strength and Stamina;Understanding of Exercise Prescription   Comments Reviewed RPE scale, THR and program prescription with pt today.  Pt voiced understanding and was given a copy of goals to take home. Ruben [name] did well for his first session of exercise at rehab. He was able to reach 35 laps on the track and maintained appropriate RPEs. We will continue to monitor as he progresses in the program. Ruben [name] continues to do well in rehab. He increased his overall average MET level to 3.31 METs. He also improved to level 4 on the T4 nustep and XR. He has stayed  consistent with the recumbent bike at level 1. He increased his laps on the track up to 36 laps as well. We will continue to monitor his progress in the program. Ruben [name] is doing well in rehab.  He is walking some on his own already. Reviewed home exercise with pt today.  Pt plans to continue to walk at home for exercise.  Reviewed THR, pulse, RPE, sign and symptoms, pulse oximetery and when to call 911 or MD.  Also discussed weather considerations and indoor options.  Pt voiced understanding. Ruben [name] is doing well in rehab.  He is up to 43 laps on the track!  He is also up to level 5 on the recumbent bike.  We will conitnue to monitor his progress.   Expected Outcomes Short: Use RPE daily to regulate intensity.  Long: Follow program prescription  in THR. Short: Continue to follow initial exercise prescription Long: Increase overall strength and stamina Short: Try level 2 on the recumbent bike. Long: Continue to improve strength and stamina. Short: Start to add in more exercise at home Long: Continue to exercise independently Short: Continue to aim for a mile each day on track Long: Continue to improve stamina    Row Name 01/05/23 1513 01/11/23 1643 01/22/23 0725 01/22/23 0729 02/03/23 1414     Exercise Goal Re-Evaluation   Exercise Goals Review Increase Physical Activity;Increase Strength and Stamina;Understanding of Exercise Prescription Increase Physical Activity;Increase Strength and Stamina Increase Physical Activity;Increase Strength and Stamina;Understanding of Exercise Prescription -- Increase Physical Activity;Increase Strength and Stamina;Understanding of Exercise Prescription   Comments Ruben [name] continues to do well in rehab. He recently increased his laps on the track to 46 laps. He also began interval training on the XR with intervals ranging between levels 5-8. He also continues to do well with 4 lb hand weights for resistance training. We will continue to monitor his progress. Ruben [name] continues  to do well in rehab. He increased hand weights to 5 lbs recently and is doing well. He reports feeling incr in muscle strength. Ruben [name] is doing well with his exercise progression, he is doing intervals with the REXR up to level 8. He continues to increase his strength and stamina. Ruben [name] is doing well with his exercise progression, he is doing intervals with the REXR up to level 8. He continues to increase his strength and stamina. He is walkinf a mile on the track.  Will continue to monitor his exercise progression. Ruben [name] continues to do well in rehab and is close to graduating. He improved to level 4 on the T4 nustep and also continues to walk more than 40 laps on the track. He also completed his post and improved by 14.8%. We will continue to monitor his progress in the program.   Expected Outcomes Short: Try 5 lb hand weights for resistance training. Long: Continue to improve strength and stamina. Short: Maintain progress made and continue with recent increases made  Long: Continue to improve strength and stamina -- Short: Maintain progress made and continue with recent increases made  Long: Continue to improve strength and stamina Short: Graduate. Long: Continue to exercise independently.            Discharge Exercise Prescription (Final Exercise Prescription Changes):  Exercise Prescription Changes - 02/03/23 1400       Response to Exercise   Blood Pressure (Admit) 126/48    Blood Pressure (Exit) 116/50    Heart Rate (Admit) 52 bpm    Heart Rate (Exercise) 81 bpm    Heart Rate (Exit) 64 bpm    Rating of Perceived Exertion (Exercise) 13    Symptoms none    Duration Continue with 30 min of aerobic exercise without signs/symptoms of physical distress.    Intensity THRR unchanged      Progression   Progression Continue to progress workloads to maintain intensity without signs/symptoms of physical distress.    Average METs 3.5      Resistance Training   Training Prescription  Yes    Weight 5 lb    Reps 10-15      Interval Training   Interval Training No      NuStep   Level 4    Minutes 15    METs 4      Track   Laps 45    Minutes 15    METs  3.45      Home Exercise Plan   Plans to continue exercise at Home (comment)   walking   Frequency Add 2 additional days to program exercise sessions.    Initial Home Exercises Provided 12/17/22      Oxygen   Maintain Oxygen Saturation 88% or higher             Nutrition:  Target Goals: Understanding of nutrition guidelines, daily intake of sodium 1500mg , cholesterol 200mg , calories 30% from fat and 7% or less from saturated fats, daily to have 5 or more servings of fruits and vegetables.  Education: All About Nutrition: -Group instruction provided by verbal, written material, interactive activities, discussions, models, and posters to present general guidelines for heart healthy nutrition including fat, fiber, MyPlate, the role of sodium in heart healthy nutrition, utilization of the nutrition label, and utilization of this knowledge for meal planning. Follow up email sent as well. Written material given at graduation.   Biometrics:  Pre Biometrics - 11/18/22 1528       Pre Biometrics   Height 5' 8.5" (1.74 m)    Weight 131 lb (59.4 kg)    Waist Circumference 30 inches    Hip Circumference 34 inches    Waist to Hip Ratio 0.88 %    BMI (Calculated) 19.63    Single Leg Stand 30 seconds              Nutrition Therapy Plan and Nutrition Goals:  Nutrition Therapy & Goals - 01/18/23 1342       Nutrition Therapy   RD appointment deferred Yes             Nutrition Assessments:  MEDIFICTS Score Key: ?70 Need to make dietary changes  40-70 Heart Healthy Diet ? 40 Therapeutic Level Cholesterol Diet  Flowsheet Row Cardiac Rehab from 02/03/2023 in [facility] Cardiac and Pulmonary Rehab  Picture Your Plate Total Score on Discharge 80      Picture Your Plate Scores: <29 Unhealthy dietary  pattern with much room for improvement. 41-50 Dietary pattern unlikely to meet recommendations for good health and room for improvement. 51-60 More healthful dietary pattern, with some room for improvement.  >60 Healthy dietary pattern, although there may be some specific behaviors that could be improved.    Nutrition Goals Re-Evaluation:  Nutrition Goals Re-Evaluation     Row Name 12/17/22 1358 01/11/23 1634           Goals   Nutrition Goal Set up appt with dietitian Set up appt with dietitian      Comment Ruben [name] eats a whole foods diet and has been doing so for 6 years.  He feels he has a good grasp but still wants to meet.  He does not eat processed food and uses beans for protein source. Ruben [name] eats a whole foods diet and has been doing so for 6 years.  He feels he has a good grasp but still wants to meet.  He does not eat processed food and uses beans for protein source.      Expected Outcome Short: Set up appt with dietitian Long: Continue to eat heart healthy Short: Set up appt with dietitian Long: Continue to eat heart healthy               Nutrition Goals Discharge (Final Nutrition Goals Re-Evaluation):  Nutrition Goals Re-Evaluation - 01/11/23 1634       Goals   Nutrition Goal Set up appt with dietitian  Comment Ruben [name] eats a whole foods diet and has been doing so for 6 years.  He feels he has a good grasp but still wants to meet.  He does not eat processed food and uses beans for protein source.    Expected Outcome Short: Set up appt with dietitian Long: Continue to eat heart healthy             Psychosocial: Target Goals: Acknowledge presence or absence of significant depression and/or stress, maximize coping skills, provide positive support system. Participant is able to verbalize types and ability to use techniques and skills needed for reducing stress and depression.   Education: Stress, Anxiety, and Depression - Group verbal and visual presentation to  define topics covered.  Reviews how body is impacted by stress, anxiety, and depression.  Also discusses healthy ways to reduce stress and to treat/manage anxiety and depression.  Written material given at graduation.   Education: Sleep Hygiene -Provides group verbal and written instruction about how sleep can affect your health.  Define sleep hygiene, discuss sleep cycles and impact of sleep habits. Review good sleep hygiene tips.    Initial Review & Psychosocial Screening:  Initial Psych Review & Screening - 11/12/22 1340       Initial Review   Current issues with None Identified      Family Dynamics   Good Support System? Yes   wife Ruben [name], daughter Ruben [name],  friends     Barriers   Psychosocial barriers to participate in program There are no identifiable barriers or psychosocial needs.      Screening Interventions   Interventions Encouraged to exercise;To provide support and resources with identified psychosocial needs;Provide feedback about the scores to participant    Expected Outcomes Short Term goal: Utilizing psychosocial counselor, staff and physician to assist with identification of specific Stressors or current issues interfering with healing process. Setting desired goal for each stressor or current issue identified.;Long Term Goal: Stressors or current issues are controlled or eliminated.;Short Term goal: Identification and review with participant of any Quality of Life or Depression concerns found by scoring the questionnaire.;Long Term goal: The participant improves quality of Life and PHQ9 Scores as seen by post scores and/or verbalization of changes             Quality of Life Scores:   Quality of Life - 02/03/23 1349       Quality of Life   Select Quality of Life      Quality of Life Scores   Health/Function Pre 27.5 %    Health/Function Post 28 %    Health/Function % Change 1.82 %    Socioeconomic Pre 29.14 %    Socioeconomic Post 30 %    Socioeconomic %  Change  2.95 %    Psych/Spiritual Pre 30 %    Psych/Spiritual Post 28.93 %    Psych/Spiritual % Change -3.57 %    Family Pre 25.2 %    Family Post 25.2 %    Family % Change 0 %    GLOBAL Pre 28.01 %    GLOBAL Post 28.19 %    GLOBAL % Change 0.64 %            Scores of 19 and below usually indicate a poorer quality of life in these areas.  A difference of  2-3 points is a clinically meaningful difference.  A difference of 2-3 points in the total score of the Quality of Life Index has been associated with significant improvement  in overall quality of life, self-image, physical symptoms, and general health in studies assessing change in quality of life.  PHQ-9: Review Flowsheet       02/03/2023 11/18/2022 05/13/2017  Depression screen PHQ 2/9  Decreased Interest 0 0 0  Down, Depressed, Hopeless 0 0 0  PHQ - 2 Score 0 0 0  Altered sleeping 1 0 -  Tired, decreased energy 0 1 -  Change in appetite 0 0 -  Feeling bad or failure about yourself  0 0 -  Trouble concentrating 0 0 -  Moving slowly or fidgety/restless 0 0 -  Suicidal thoughts 0 0 -  PHQ-9 Score 1 1 -  Difficult doing work/chores Not difficult at all Not difficult at all -    Details           Interpretation of Total Score  Total Score Depression Severity:  1-4 = Minimal depression, 5-9 = Mild depression, 10-14 = Moderate depression, 15-19 = Moderately severe depression, 20-27 = Severe depression   Psychosocial Evaluation and Intervention:  Psychosocial Evaluation - 11/12/22 1400       Psychosocial Evaluation & Interventions   Interventions Encouraged to exercise with the program and follow exercise prescription    Comments Ruben [name] has no barriers to attending the program.  He wants to improve his strength upper and lower body. He has lost 8-10 lbs and wants to gain some back. He lives with his wife,Ruben [name]. She and their daughter Ruben [name] are his support as well as friends are being helpful. He is ready to start the  program.    Expected Outcomes STG  attends all scheduled sessions and is able to workon exercise progression. LTG COntinues to work on his exercise progression after discharge.    Continue Psychosocial Services  Follow up required by staff             Psychosocial Re-Evaluation:  Psychosocial Re-Evaluation     Row Name 12/17/22 1356 01/11/23 1635           Psychosocial Re-Evaluation   Current issues with Current Sleep Concerns Current Sleep Concerns      Comments Ruben [name] is doing well in rehab.  He is generally a laid back person and tries not to let people and stress get to him too much.  He continues to have issues with sleeping but will get at least 6-7 hours each day. Sleep has been a chronic concern. Ruben [name] reports some improvement in sleep. "I am now sleeping 8 hours some nights, other nights 6-7 hours." Ruben [name] has tried Rx sleep aids that were unsuccessful. He uses Tylenol PM as needed. 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 He has discussed these concerns with ENT. He expresses that he is pleased with the improvement in sleep that he has had and will discuss any further concerns with ENT or PCP moving forward.      Expected Outcomes Short: Continue to exercise  for mental boost Long: Continue to stay positive Short: Continue to exercise to improve sleep  Long: Recognize any need to discuss new sleep concerns with MD      Interventions Encouraged to attend Cardiac Rehabilitation for the exercise Encouraged to attend Cardiac Rehabilitation for the exercise      Continue Psychosocial Services  Follow up required by staff Follow up required by staff  Psychosocial Discharge (Final Psychosocial Re-Evaluation):  Psychosocial Re-Evaluation - 01/11/23 1635       Psychosocial Re-Evaluation   Current issues with Current Sleep Concerns    Comments Ruben [name] reports some  improvement in sleep. "I am now sleeping 8 hours some nights, other nights 6-7 hours." Ruben [name] has tried Rx sleep aids that were unsuccessful. He uses Tylenol PM as needed. 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 He has discussed these concerns with ENT. He expresses that he is pleased with the improvement in sleep that he has had and will discuss any further concerns with ENT or PCP moving forward.    Expected Outcomes Short: Continue to exercise to improve sleep  Long: Recognize any need to discuss new sleep concerns with MD    Interventions Encouraged to attend Cardiac Rehabilitation for the exercise    Continue Psychosocial Services  Follow up required by staff             Vocational Rehabilitation: Provide vocational rehab assistance to qualifying candidates.   Vocational Rehab Evaluation & Intervention:  Vocational Rehab - 11/12/22 1353       Initial Vocational Rehab Evaluation & Intervention   Assessment shows need for Vocational Rehabilitation No      Vocational Rehab Re-Evaulation   Comments retired      Discharge Vocational Rehab   Discharge Vocational Rehabilitation retired             Education: Education Goals: Education classes will be provided on a variety of topics geared toward better understanding of heart health and risk factor modification. Participant will state understanding/return demonstration of topics presented as noted by education test scores.  Learning Barriers/Preferences:  Learning Barriers/Preferences - 11/12/22 1346       Learning Barriers/Preferences   Learning Barriers Hearing   has high pitch ringing in his ear, may dull hearing   Learning Preferences Video;Written Material;Individual Instruction             General Cardiac Education Topics:  AED/CPR: - Group verbal and written instruction with the use of models to demonstrate the basic  use of the AED with the basic ABC's of resuscitation.   Anatomy and Cardiac Procedures: - Group verbal and visual presentation and models provide information about basic cardiac anatomy and function. Reviews the testing methods done to diagnose heart disease and the outcomes of the test results. Describes the treatment choices: Medical Management, Angioplasty, or Coronary Bypass Surgery for treating various heart conditions including Myocardial Infarction, Angina, Valve Disease, and Cardiac Arrhythmias.  Written material given at graduation. Flowsheet Row Cardiac Rehab from 12/02/2022 in [facility] Rehab  Education need identified 11/18/22       Medication Safety: - Group verbal and visual instruction to review commonly prescribed medications for heart and lung disease. Reviews the medication, class of the drug, and side effects. Includes the steps to properly store meds and maintain the prescription regimen.  Written material given at graduation.   Intimacy: - Group verbal instruction through game format to discuss how heart and lung disease can affect sexual intimacy. Written material given at graduation..   Know Your Numbers and Heart Failure: - Group verbal and visual instruction to discuss disease risk factors for cardiac and pulmonary disease and treatment options.  Reviews associated critical values for Overweight/Obesity, Hypertension, Cholesterol, and Diabetes.  Discusses basics of heart failure: signs/symptoms and treatments.  Introduces Heart  Failure Zone chart for action plan for heart failure.  Written material given at graduation. Flowsheet Row Cardiac Rehab from 12/02/2022 in [facility] Cardiac and Pulmonary Rehab  Date 11/25/22  Educator MS  Instruction Review Code 1- Verbalizes Understanding       Infection Prevention: - Provides verbal and written material to individual with discussion of infection control including proper hand washing and proper equipment  cleaning during exercise session. Flowsheet Row Cardiac Rehab from 12/02/2022 in [facility] Cardiac and Pulmonary Rehab  Date 11/18/22  Educator NT  Instruction Review Code 1- Verbalizes Understanding       Falls Prevention: - Provides verbal and written material to individual with discussion of falls prevention and safety. Flowsheet Row Cardiac Rehab from 12/02/2022 in [facility] Cardiac and Pulmonary Rehab  Date 11/12/22  Educator SB  Instruction Review Code 1- Verbalizes Understanding       Other: -Provides group and verbal instruction on various topics (see comments)   Knowledge Questionnaire Score:  Knowledge Questionnaire Score - 02/03/23 1349       Knowledge Questionnaire Score   Post Score 26/26             Core Components/Risk Factors/Patient Goals at Admission:  Personal Goals and Risk Factors at Admission - 11/12/22 1348       Core Components/Risk Factors/Patient Goals on Admission    Weight Management Yes    Intervention Weight Management: Develop a combined nutrition and exercise program designed to reach desired caloric intake, while maintaining appropriate intake of nutrient and fiber, sodium and fats, and appropriate energy expenditure required for the weight goal.;Weight Management: Provide education and appropriate resources to help participant work on and attain dietary goals.    Admit Weight 120 lb (54.4 kg)   lost 8-10 lbs in hospital   Goal Weight: Short Term 122 lb (55.3 kg)    Goal Weight: Long Term 130 lb (59 kg)    Expected Outcomes Short Term: Continue to assess and modify interventions until short term weight is achieved;Long Term: Adherence to nutrition and physical activity/exercise program aimed toward attainment of established weight goal;Weight Gain: Understanding of general recommendations for a high calorie, high protein meal plan that promotes weight gain by distributing calorie intake throughout the day with the consumption for 4-5 meals, snacks,  and/or supplements    Lipids Yes    Intervention Provide education and support for participant on nutrition & aerobic/resistive exercise along with prescribed medications to achieve LDL 70mg , HDL >40mg .    Expected Outcomes Short Term: Participant states understanding of desired cholesterol values and is compliant with medications prescribed. Participant is following exercise prescription and nutrition guidelines.;Long Term: Cholesterol controlled with medications as prescribed, with individualized exercise RX and with personalized nutrition plan. Value goals: LDL < 70mg , HDL > 40 mg.             Education:Diabetes - Individual verbal and written instruction to review signs/symptoms of diabetes, desired ranges of glucose level fasting, after meals and with exercise. Acknowledge that pre and post exercise glucose checks will be done for 3 sessions at entry of program.   Core Components/Risk Factors/Patient Goals Review:   Goals and Risk Factor Review     Row Name 12/17/22 1404 01/11/23 1628           Core Components/Risk Factors/Patient Goals Review   Personal Goals Review Weight Management/Obesity Weight Management/Obesity;Lipids      Review Ruben [name] is doing well in rehab.  His weight is starting to come back up  and he is eating protein to help.  He will check his pressures are home when he feels like it is off. Ruben [name] continues to do well in rehab. His weight today is 134.8. He is happy to report noticing increase in his muscle strength. He would like to continue to work toward increasing weight to 140 lb. Ruben [name] will schedule and appt to meet with the dietician to discuss nutritional goals related to weight gain. Ruben [name] continues to be compliant with medicaitons including rosuvastatin 40 mg daily for lipid management. Ruben [name] thinks he is due for repeat lipid panel early July. Ruben [name] will follow up with MD to verify when labs are due.      Expected Outcomes Short: Continue to work on  weight gain Long: Continue to monitor risk factors Short: Continue rehab sessions; meet with RD  Long: Maintain healthy weight and lipid control               Core Components/Risk Factors/Patient Goals at Discharge (Final Review):   Goals and Risk Factor Review - 01/11/23 1628       Core Components/Risk Factors/Patient Goals Review   Personal Goals Review Weight Management/Obesity;Lipids    Review Ruben [name] continues to do well in rehab. His weight today is 134.8. He is happy to report noticing increase in his muscle strength. He would like to continue to work toward increasing weight to 140 lb. Ruben [name] will schedule and appt to meet with the dietician to discuss nutritional goals related to weight gain. Ruben [name] continues to be compliant with medicaitons including rosuvastatin 40 mg daily for lipid management. Ruben [name] thinks he is due for repeat lipid panel early July. Ruben [name] will follow up with MD to verify when labs are due.    Expected Outcomes Short: Continue rehab sessions; meet with RD  Long: Maintain healthy weight and lipid control             ITP Comments:  ITP Comments     Row Name 11/12/22 1404 11/18/22 1506 11/19/22 1341 12/02/22 1205 12/30/22 1142   ITP Comments Virtual orientation call completed today. he has an appointment on Date: 11/18/2022  for EP eval and gym Orientation.  Documentation of diagnosis can be found in [facility]  Date: 10/03/2022 . Completed and gym orientation. Initial ITP created and sent for review to Dr. Bethann Punches, Medical Director. First full day of exercise!  Patient was oriented to gym and equipment including functions, settings, policies, and procedures.  Patient's individual exercise prescription and treatment plan were reviewed.  All starting workloads were established based on the results of the 6 minute walk test done at initial orientation visit.  The plan for exercise progression was also introduced and progression will be customized based on  patient's performance and goals. 30 Day review completed. Medical Director ITP review done, changes made as directed, and signed approval by Medical Director.   new to program 30 Day review completed. Medical Director ITP review done, changes made as directed, and signed approval by Medical Director.    Row Name 01/26/23 1516 02/10/23 1359         ITP Comments 30 Day review completed. Medical Director ITP review done, changes made as directed, and signed approval by Medical Director. Demetres graduated today from  rehab with 36 sessions completed.  Details of the patient's exercise prescription and what He needs to do in order to continue the prescription and progress were discussed with patient.  Patient was given a copy of prescription and goals.  Patient verbalized understanding.  Yonael plans to continue to exercise by walking.               Comments: discharge ITP

## 2023-02-10 NOTE — Progress Notes (Signed)
Daily Session Note  Patient Details  Name: Ruben [name] MRN: [id number] Date of Birth: 1946-07-09 Referring Provider:   Flowsheet Row Cardiac Rehab from 11/18/2022 in [facility] Cardiac and Pulmonary Rehab  Referring Provider Dr. Marcina Millard, MD       Encounter Date: 02/10/2023  Check In:  Session Check In - 02/10/23 1352       Check-In   Supervising physician immediately available to respond to emergencies See telemetry face sheet for immediately available ER MD    Location [facility]-Cardiac & Pulmonary Rehab    Staff Present Susann Givens, RN Atilano Median, RN, ADN;Other   Maggie Best and Max Conetta   Virtual Visit No    Medication changes reported     No    Fall or balance concerns reported    No    Warm-up and Cool-down Performed on first and last piece of equipment    Resistance Training Performed Yes    VAD Patient? No    PAD/SET Patient? No      Pain Assessment   Currently in Pain? No/denies                Social History   Tobacco Use  Smoking Status Never  Smokeless Tobacco Never    Goals Met:  Independence with exercise equipment Exercise tolerated well No report of concerns or symptoms today Strength training completed today  Goals Unmet:  Not Applicable  Comments:  Ruben [name] graduated today from  rehab with 36 sessions completed.  Details of the patient's exercise prescription and what He needs to do in order to continue the prescription and progress were discussed with patient.  Patient was given a copy of prescription and goals.  Patient verbalized understanding.  Ruben [name] plans to continue to exercise by walking.     Dr. Bethann Punches is Medical Director for [facility] Cardiac Rehabilitation.  Dr. Vida Rigger is Medical Director for [facility] Pulmonary Rehabilitation.

## 2023-02-10 NOTE — Progress Notes (Signed)
Discharge Summary   Ruben [name] DOB: July 19, 2046  Ruben [name] graduated today from  rehab with 36 sessions completed.  Details of the patient's exercise prescription and what Ruben [name] needs to do in order to continue the prescription and progress were discussed with patient.  Patient was given a copy of prescription and goals.  Patient verbalized understanding.  Ruben [name] plans to continue to exercise by walking.   6 Minute Walk     Row Name 11/18/22 1526 02/01/23 1421       6 Minute Walk   Phase Initial Discharge    Distance 1220 feet 1400 feet    Distance % Change -- 14.8 %    Distance Feet Change -- 180 ft    Walk Time 6 minutes 6 minutes    # of Rest Breaks 0 0    MPH 2.31 2.65    METS 2.91 3.08    RPE 12 12    Perceived Dyspnea  0 0    VO2 Peak 10.2 10.76    Symptoms No No    Resting HR 62 bpm 55 bpm    Resting BP 136/68 140/62    Resting Oxygen Saturation  99 % 98 %    Exercise Oxygen Saturation  during 6 min walk 100 % 98 %    Max Ex. HR 84 bpm 75 bpm    Max Ex. BP 144/70 138/60    2 Minute Post BP 128/70 --

## 2023-02-12 ENCOUNTER — Other Ambulatory Visit: Payer: Self-pay | Admitting: Surgery

## 2023-03-19 ENCOUNTER — Other Ambulatory Visit: Payer: Self-pay | Admitting: Surgical

## 2023-04-16 DIAGNOSIS — Z85810 Personal history of malignant neoplasm of tongue: Secondary | ICD-10-CM | POA: Diagnosis not present

## 2023-04-16 DIAGNOSIS — R682 Dry mouth, unspecified: Secondary | ICD-10-CM | POA: Diagnosis not present

## 2023-05-25 DIAGNOSIS — I214 Non-ST elevation (NSTEMI) myocardial infarction: Secondary | ICD-10-CM | POA: Diagnosis not present

## 2023-05-25 DIAGNOSIS — E7800 Pure hypercholesterolemia, unspecified: Secondary | ICD-10-CM | POA: Diagnosis not present

## 2023-05-25 DIAGNOSIS — I480 Paroxysmal atrial fibrillation: Secondary | ICD-10-CM | POA: Diagnosis not present

## 2023-05-25 DIAGNOSIS — G459 Transient cerebral ischemic attack, unspecified: Secondary | ICD-10-CM | POA: Diagnosis not present

## 2023-05-25 DIAGNOSIS — I25721 Atherosclerosis of autologous artery coronary artery bypass graft(s) with angina pectoris with documented spasm: Secondary | ICD-10-CM | POA: Diagnosis not present

## 2023-06-07 ENCOUNTER — Other Ambulatory Visit: Payer: Self-pay

## 2023-06-07 ENCOUNTER — Emergency Department: Payer: PPO

## 2023-06-07 ENCOUNTER — Inpatient Hospital Stay
Admission: EM | Admit: 2023-06-07 | Discharge: 2023-06-11 | DRG: 388 | Disposition: A | Discharge status: Home / Self Care | Payer: PPO | Attending: Internal Medicine | Admitting: Internal Medicine

## 2023-06-07 DIAGNOSIS — Z1152 Encounter for screening for COVID-19: Secondary | ICD-10-CM

## 2023-06-07 DIAGNOSIS — I5032 Chronic diastolic (congestive) heart failure: Secondary | ICD-10-CM | POA: Diagnosis not present

## 2023-06-07 DIAGNOSIS — K8020 Calculus of gallbladder without cholecystitis without obstruction: Secondary | ICD-10-CM | POA: Diagnosis not present

## 2023-06-07 DIAGNOSIS — Z7982 Long term (current) use of aspirin: Secondary | ICD-10-CM | POA: Diagnosis not present

## 2023-06-07 DIAGNOSIS — Z7901 Long term (current) use of anticoagulants: Secondary | ICD-10-CM | POA: Diagnosis not present

## 2023-06-07 DIAGNOSIS — N200 Calculus of kidney: Secondary | ICD-10-CM | POA: Diagnosis not present

## 2023-06-07 DIAGNOSIS — K56600 Partial intestinal obstruction, unspecified as to cause: Secondary | ICD-10-CM | POA: Diagnosis present

## 2023-06-07 DIAGNOSIS — Z951 Presence of aortocoronary bypass graft: Secondary | ICD-10-CM

## 2023-06-07 DIAGNOSIS — Z79899 Other long term (current) drug therapy: Secondary | ICD-10-CM | POA: Diagnosis not present

## 2023-06-07 DIAGNOSIS — I480 Paroxysmal atrial fibrillation: Secondary | ICD-10-CM | POA: Diagnosis present

## 2023-06-07 DIAGNOSIS — Z885 Allergy status to narcotic agent status: Secondary | ICD-10-CM

## 2023-06-07 DIAGNOSIS — I482 Chronic atrial fibrillation, unspecified: Secondary | ICD-10-CM | POA: Diagnosis present

## 2023-06-07 DIAGNOSIS — E785 Hyperlipidemia, unspecified: Secondary | ICD-10-CM | POA: Diagnosis present

## 2023-06-07 DIAGNOSIS — G459 Transient cerebral ischemic attack, unspecified: Secondary | ICD-10-CM | POA: Diagnosis present

## 2023-06-07 DIAGNOSIS — Z886 Allergy status to analgesic agent status: Secondary | ICD-10-CM | POA: Diagnosis not present

## 2023-06-07 DIAGNOSIS — Z800 Family history of malignant neoplasm of digestive organs: Secondary | ICD-10-CM | POA: Diagnosis not present

## 2023-06-07 DIAGNOSIS — I5033 Acute on chronic diastolic (congestive) heart failure: Secondary | ICD-10-CM | POA: Diagnosis present

## 2023-06-07 DIAGNOSIS — Z8249 Family history of ischemic heart disease and other diseases of the circulatory system: Secondary | ICD-10-CM | POA: Diagnosis not present

## 2023-06-07 DIAGNOSIS — K56609 Unspecified intestinal obstruction, unspecified as to partial versus complete obstruction: Secondary | ICD-10-CM | POA: Diagnosis present

## 2023-06-07 DIAGNOSIS — Z8673 Personal history of transient ischemic attack (TIA), and cerebral infarction without residual deficits: Secondary | ICD-10-CM

## 2023-06-07 DIAGNOSIS — R109 Unspecified abdominal pain: Secondary | ICD-10-CM | POA: Diagnosis not present

## 2023-06-07 DIAGNOSIS — D72819 Decreased white blood cell count, unspecified: Secondary | ICD-10-CM | POA: Diagnosis present

## 2023-06-07 DIAGNOSIS — I5030 Unspecified diastolic (congestive) heart failure: Secondary | ICD-10-CM | POA: Diagnosis not present

## 2023-06-07 DIAGNOSIS — I2510 Atherosclerotic heart disease of native coronary artery without angina pectoris: Secondary | ICD-10-CM | POA: Diagnosis present

## 2023-06-07 DIAGNOSIS — K6389 Other specified diseases of intestine: Secondary | ICD-10-CM | POA: Diagnosis not present

## 2023-06-07 DIAGNOSIS — Z9049 Acquired absence of other specified parts of digestive tract: Secondary | ICD-10-CM

## 2023-06-07 DIAGNOSIS — D1803 Hemangioma of intra-abdominal structures: Secondary | ICD-10-CM | POA: Diagnosis not present

## 2023-06-07 LAB — CBC
HCT: 36.9 % — ABNORMAL LOW (ref 39.0–52.0)
Hemoglobin: 12 g/dL — ABNORMAL LOW (ref 13.0–17.0)
MCH: 33.1 pg (ref 26.0–34.0)
MCHC: 32.5 g/dL (ref 30.0–36.0)
MCV: 101.9 fL — ABNORMAL HIGH (ref 80.0–100.0)
Platelets: 190 10*3/uL (ref 150–400)
RBC: 3.62 MIL/uL — ABNORMAL LOW (ref 4.22–5.81)
RDW: 13 % (ref 11.5–15.5)
WBC: 6 10*3/uL (ref 4.0–10.5)
nRBC: 0 % (ref 0.0–0.2)

## 2023-06-07 LAB — COMPREHENSIVE METABOLIC PANEL
ALT: 24 U/L (ref 0–44)
AST: 30 U/L (ref 15–41)
Albumin: 3.9 g/dL (ref 3.5–5.0)
Alkaline Phosphatase: 20 U/L — ABNORMAL LOW (ref 38–126)
Anion gap: 11 (ref 5–15)
BUN: 20 mg/dL (ref 8–23)
CO2: 25 mmol/L (ref 22–32)
Calcium: 9.2 mg/dL (ref 8.9–10.3)
Chloride: 105 mmol/L (ref 98–111)
Creatinine, Ser: 1.3 mg/dL — ABNORMAL HIGH (ref 0.61–1.24)
GFR, Estimated: 57 mL/min — ABNORMAL LOW (ref >60)
Glucose, Bld: 108 mg/dL — ABNORMAL HIGH (ref 70–99)
Potassium: 3.9 mmol/L (ref 3.5–5.1)
Sodium: 141 mmol/L (ref 135–145)
Total Bilirubin: 1.4 mg/dL — ABNORMAL HIGH (ref <1.2)
Total Protein: 6.8 g/dL (ref 6.5–8.1)

## 2023-06-07 LAB — URINALYSIS, ROUTINE W REFLEX MICROSCOPIC
Bilirubin Urine: NEGATIVE
Glucose, UA: NEGATIVE mg/dL
Ketones, ur: NEGATIVE mg/dL
Leukocytes,Ua: NEGATIVE
Nitrite: NEGATIVE
Protein, ur: NEGATIVE mg/dL
Specific Gravity, Urine: 1.008 (ref 1.005–1.030)
Squamous Epithelial / HPF: 0 /[HPF] (ref 0–5)
pH: 6 (ref 5.0–8.0)

## 2023-06-07 LAB — LIPASE, BLOOD: Lipase: 55 U/L — ABNORMAL HIGH (ref 11–51)

## 2023-06-07 LAB — PROTIME-INR
INR: 1.3 — ABNORMAL HIGH (ref 0.8–1.2)
Prothrombin Time: 16.3 s — ABNORMAL HIGH (ref 11.4–15.2)

## 2023-06-07 MED ORDER — ENOXAPARIN SODIUM 40 MG/0.4ML IJ SOSY
40.0000 mg | PREFILLED_SYRINGE | INTRAMUSCULAR | Status: DC
  Administered 2023-06-07 – 2023-06-09 (×3): 40 mg via SUBCUTANEOUS
  Filled 2023-06-07 (×4): qty 0.4

## 2023-06-07 MED ORDER — ONDANSETRON HCL 4 MG/2ML IJ SOLN
4.0000 mg | Freq: Four times a day (QID) | INTRAMUSCULAR | Status: DC | PRN
  Administered 2023-06-07: 4 mg via INTRAVENOUS
  Filled 2023-06-07: qty 2

## 2023-06-07 MED ORDER — DEXTROSE-SODIUM CHLORIDE 5-0.45 % IV SOLN: INTRAVENOUS | Status: AC

## 2023-06-07 MED ORDER — ONDANSETRON HCL 4 MG PO TABS: 4.0000 mg | ORAL_TABLET | Freq: Four times a day (QID) | ORAL | Status: DC | PRN

## 2023-06-07 MED ORDER — MORPHINE SULFATE (PF) 2 MG/ML IV SOLN
2.0000 mg | INTRAVENOUS | Status: DC | PRN
  Administered 2023-06-07 (×2): 2 mg via INTRAVENOUS
  Filled 2023-06-07 (×2): qty 1

## 2023-06-07 MED ORDER — IOHEXOL 300 MG/ML  SOLN
100.0000 mL | Freq: Once | INTRAMUSCULAR | Status: AC | PRN
  Administered 2023-06-07: 100 mL via INTRAVENOUS

## 2023-06-07 NOTE — ED Notes (Signed)
See triage note  Presents with abd pain since last pm States he has a hx of bowel obstructions in past and feels the same

## 2023-06-07 NOTE — ED Provider Notes (Signed)
[facility] Provider Note    Event Date/Time   First MD Initiated Contact with Patient 06/07/23 [phone]     (approximate)   History   Abdominal Pain   HPI Ruben [name] is a 77 y.o. male presenting today for abdominal pain.  Patient states around 7 PM last night he started having abdominal pain in the periumbilical and right lower quadrant region.  No improvement in symptoms.  Denies nausea, vomiting, diarrhea, constipation, dysuria, hematuria.  No fevers or chills.  Denies chest pain or shortness of breath.  Does report prior history of Meckel's diverticulum which was removed surgically 1998 and had appendectomy at the same time.  Then also had small bowel obstruction early 2000's with surgical repair.  Denies any other abdominal surgeries.  Reviewed most recent chart notes.     Physical Exam   Triage Vital Signs: ED Triage Vitals  Encounter Vitals Group     BP 06/07/23 0839 (!) 157/81     Systolic BP Percentile --      Diastolic BP Percentile --      Pulse Rate 06/07/23 0839 75     Resp 06/07/23 0839 17     Temp 06/07/23 0839 97.7 F (36.5 C)     Temp Source 06/07/23 0839 Axillary     SpO2 06/07/23 0839 100 %     Weight 06/07/23 0834 135 lb (61.2 kg)     Height 06/07/23 0834 5\' 8"  (1.727 m)     Head Circumference --      Peak Flow --      Pain Score 06/07/23 0833 3     Pain Loc --      Pain Education --      Exclude from Growth Chart --     Most recent vital signs: Vitals:   06/07/23 1010 06/07/23 1242  BP: (!) 127/56 (!) 164/66  Pulse: (!) 47 60  Resp:  16  Temp:  98 F (36.7 C)  SpO2: 100% 100%   Physical Exam: I have reviewed the vital signs and nursing notes. General: Awake, alert, no acute distress.  Nontoxic appearing. Head:  Atraumatic, normocephalic.   ENT:  EOM intact, PERRL. Oral mucosa is pink and moist with no lesions. Neck: Neck is supple with full range of motion, No meningeal signs. Cardiovascular:  RRR, No  murmurs. Peripheral pulses palpable and equal bilaterally. Respiratory:  Symmetrical chest wall expansion.  No rhonchi, rales, or wheezes.  Good air movement throughout.  No use of accessory muscles.   Musculoskeletal:  No cyanosis or edema. Moving extremities with full ROM Abdomen:  Soft, tenderness palpation periumbilically and right lower quadrant.  No tenderness palpation elsewhere. nondistended. Neuro:  GCS 15, moving all four extremities, interacting appropriately. Speech clear. Psych:  Calm, appropriate.   Skin:  Warm, dry, no rash.    ED Results / Procedures / Treatments   Labs (all labs ordered are listed, but only abnormal results are displayed) Labs Reviewed  LIPASE, BLOOD - Abnormal; Notable for the following components:      Result Value   Lipase 55 (*)    All other components within normal limits  COMPREHENSIVE METABOLIC PANEL - Abnormal; Notable for the following components:   Glucose, Bld 108 (*)    Creatinine, Ser 1.30 (*)    Alkaline Phosphatase 20 (*)    Total Bilirubin 1.4 (*)    GFR, Estimated 57 (*)    All other components within normal limits  CBC -  Abnormal; Notable for the following components:   RBC 3.62 (*)    Hemoglobin 12.0 (*)    HCT 36.9 (*)    MCV 101.9 (*)    All other components within normal limits  URINALYSIS, ROUTINE W REFLEX MICROSCOPIC - Abnormal; Notable for the following components:   Color, Urine STRAW (*)    APPearance CLEAR (*)    Hgb urine dipstick SMALL (*)    Bacteria, UA RARE (*)    All other components within normal limits     EKG    RADIOLOGY Independently interpreted CT abdomen/pelvis concerning for possible SBO   PROCEDURES:  Critical Care performed: No  Procedures   MEDICATIONS ORDERED IN ED: Medications  iohexol (OMNIPAQUE) 300 MG/ML solution 100 mL (100 mLs Intravenous Contrast Given 06/07/23 1117)     IMPRESSION / MDM / ASSESSMENT AND PLAN / ED COURSE  I reviewed the triage vital signs and the  nursing notes.                              Differential diagnosis includes, but is not limited to, SBO, cholecystitis, pancreatitis, viral gastroenteritis  Patient's presentation is most consistent with acute complicated illness / injury requiring diagnostic workup.  Patient is a 77 year old male with prior history of SBO and adhesions requiring lysis presenting today for periumbilical abdominal pain since 7 PM last night.  Concern for small bowel obstruction.  Vital signs stable.  Laboratory workup with mild lipase elevation and mild T. bili elevation.  Although, patient has no tenderness palpation in the right upper quadrant to suspect cholecystitis.  CT abdomen/pelvis was ordered shows concern for developing versus partial small bowel obstruction.  General surgery was consulted and recommends admission to medicine team for observation.  No emergent surgery required.  No immediate NG tube needed at this time given no nausea.  Hospitalist has agreed to admit patient for further care.  The patient is on the cardiac monitor to evaluate for evidence of arrhythmia and/or significant heart rate changes. Clinical Course as of 06/07/23 1511  Mon Jun 07, 2023  6387 Comprehensive metabolic panel(!) Creatinine comparable to baseline.  Mild T. bili elevation [DW]  1408 CT ABDOMEN PELVIS W CONTRAST Dilated small bowel centrally with some stranding. Transition in the central pelvis at the suture line. The findings are consistent with a developing or partial obstruction. [DW]  1420 Gen surg - medicine admit, no NG tube at this time with no active vomiting [DW]    Clinical Course User Index [DW] Janith Lima, MD     FINAL CLINICAL IMPRESSION(S) / ED DIAGNOSES   Final diagnoses:  Partial small bowel obstruction (HCC)     Rx / DC Orders   ED Discharge Orders     None        Note:  This document was prepared using Dragon voice recognition software and may include unintentional dictation  errors.   Janith Lima, MD 06/07/23 [phone]

## 2023-06-07 NOTE — Assessment & Plan Note (Signed)
-  Hold statin for now

## 2023-06-07 NOTE — Consult Note (Signed)
[facility] SURGICAL CONSULTATION NOTE (initial) - cpt: 13244   HISTORY OF PRESENT ILLNESS (HPI):  77 y.o. male presented to [facility] ED today for evaluation of abdominal pain. Patient reports the acute onset of centralized abdominal pain last night. Also with an area of distension centrally. This was sharp in nature. He denied any associated fever, chills, nausea, emesis, CP, SOB, or urinary changes. He does report passing flatus and did have a BM in the ED. Of note, he has a history small bowel resection for Meckel's Diverticulum in 1998. In 2005, he developed small bowel obstruction requiring exploratory laparotomy, small bowel resection, and appendectomy. Additionally, he is s/p 3 vessel CABG n March 2024 and on Eliquis and ASA. Work up in the ED revealed a normal WBC to 6.0, Hgb to 12.0, sCr - 1.30 (this seems to be around baseline compared to previous labs). CT Abdomen/Pelvis was obtained and concerning developing bowel obstruction.   Surgery is consulted by emergency medicine physician Dr. Claudell Kyle, MD in this context for evaluation and management of SBO.  PAST MEDICAL HISTORY (PMH):  Past Medical History:  Diagnosis Date   TIA (transient ischemic attack)      PAST SURGICAL HISTORY (PSH):  Past Surgical History:  Procedure Laterality Date   CLIPPING OF ATRIAL APPENDAGE  10/05/2022   Procedure: CLIPPING OF ATRIAL APPENDAGE;  Surgeon: Alleen Borne, MD;  Location: [facility] OR;  Service: Open Heart Surgery;;   CORONARY ARTERY BYPASS GRAFT N/A 10/05/2022   Procedure: CORONARY ARTERY BYPASS GRAFTING (CABG) X THREE BYPASSES USING OPEN LEFT INTERNAL MAMMARY ARTERY AND ENDOSCOPIC RIGHT GREATER SAPHENOUS VEIN HARVEST; CLIPPING OF ATRIAL APPENDAGE WITH ATRICLIP;  Surgeon: Alleen Borne, MD;  Location: [facility] OR;  Service: Open Heart Surgery;  Laterality: N/A;   LEFT HEART CATH AND CORONARY ANGIOGRAPHY N/A 10/02/2022   Procedure: LEFT HEART CATH AND CORONARY ANGIOGRAPHY;  Surgeon:  Alwyn Pea, MD;  Location: [facility] INVASIVE CV LAB;  Service: Cardiovascular;  Laterality: N/A;   SHOULDER ARTHROSCOPY     TEE WITHOUT CARDIOVERSION N/A 10/05/2022   Procedure: TRANSESOPHAGEAL ECHOCARDIOGRAM;  Surgeon: Alleen Borne, MD;  Location: [facility] OR;  Service: Open Heart Surgery;  Laterality: N/A;     MEDICATIONS:  Prior to Admission medications   Medication Sig Start Date End Date Taking? Authorizing Provider  amiodarone (PACERONE) 200 MG tablet Take 1 tablet (200 mg total) by mouth 2 (two) times daily. For 7 days then reduce the dose to 1 tablet (200 mg) twice daily. 10/11/22  Yes Gold, Wayne E, PA-C  apixaban (ELIQUIS) 5 MG TABS tablet Take 1 tablet (5 mg total) by mouth 2 (two) times daily. 10/11/22  Yes Gold, Glenice Laine, PA-C  aspirin EC 81 MG tablet Take 1 tablet (81 mg total) by mouth daily. Swallow whole. 10/11/22  Yes Gold, Glenice Laine, PA-C  cholecalciferol (VITAMIN D3) 25 MCG (1000 UNIT) tablet Take 1,000 Units by mouth daily.   Yes [provider]  metoprolol succinate (TOPROL-XL) 25 MG 24 hr tablet Take 1 tablet (25 mg total) by mouth daily. 10/20/22  Yes Standley Brooking, MD  rosuvastatin (CRESTOR) 20 MG tablet Take 2 tablets (40 mg total) by mouth at bedtime. 10/02/22 10/02/23 Yes Callwood, Dwayne D, MD  vitamin B-12 (CYANOCOBALAMIN) 1000 MCG tablet Take 1,000 mcg by mouth daily.   Yes [provider]  vitamin C (ASCORBIC ACID) 250 MG tablet Take 250 mg by mouth daily.   Yes [provider]  Polyvinyl Alcohol-Povidone (REFRESH  OP) Place 1 drop into both eyes as needed (dry eye).    [provider]     ALLERGIES:  Allergies  Allergen Reactions   Codeine Nausea Only   Naproxen Sodium Other (See Comments)    Bleeding ulcers     SOCIAL HISTORY:  Social History   Socioeconomic History   Marital status: Married    Spouse name: Not on file   Number of children: Not on file   Years of education: Not on file   Highest education level: Not  on file  Occupational History   Not on file  Tobacco Use   Smoking status: Never   Smokeless tobacco: Never  Vaping Use   Vaping status: Never Used  Substance and Sexual Activity   Alcohol use: Not Currently   Drug use: Never   Sexual activity: Yes  Other Topics Concern   Not on file  Social History Narrative   Not on file   Social Determinants of Health   Financial Resource Strain: Patient Declined (02/09/2023)   Received from [facility]   Overall Financial Resource Strain (CARDIA)    Difficulty of Paying Living Expenses: Patient declined  Food Insecurity: Patient Declined (02/09/2023)   Received from [facility]   Hunger Vital Sign    Worried About Running Out of Food in the Last Year: Patient declined    Ran Out of Food in the Last Year: Patient declined  Transportation Needs: Patient Declined (02/09/2023)   Received from [facility]   PRAPARE - Transportation    In the past 12 months, has lack of transportation kept you from medical appointments or from getting medications?: Patient declined    Lack of Transportation (Non-Medical): Patient declined  Physical Activity: Not on file  Stress: Not on file  Social Connections: Not on file  Intimate Partner Violence: Not At Risk (10/16/2022)   Humiliation, Afraid, Rape, and Kick questionnaire    Fear of Current or Ex-Partner: No    Emotionally Abused: No    Physically Abused: No    Sexually Abused: No     FAMILY HISTORY:  Family History  Problem Relation Age of Onset   Colon cancer Mother    Heart disease Father       REVIEW OF SYSTEMS:  Review of Systems  Constitutional:  Negative for chills and fever.  Respiratory:  Negative for cough and shortness of breath.   Cardiovascular:  Negative for chest pain and palpitations.  Gastrointestinal:  Positive for abdominal pain. Negative for constipation, diarrhea, nausea and vomiting.  Genitourinary:  Negative for  dysuria and urgency.  All other systems reviewed and are negative.   VITAL SIGNS:  Temp:  [97.7 F (36.5 C)-98 F (36.7 C)] 98 F (36.7 C) (11/18 1242) Pulse Rate:  [44-75] 60 (11/18 1242) Resp:  [16-17] 16 (11/18 1242) BP: (84-164)/(42-81) 164/66 (11/18 1242) SpO2:  [100 %] 100 % (11/18 1242) Weight:  [61.2 kg] 61.2 kg (11/18 0834)     Height: 5\' 8"  (172.7 cm) Weight: 61.2 kg BMI (Calculated): 20.53   INTAKE/OUTPUT:  No intake/output data recorded.  PHYSICAL EXAM:  Physical Exam Vitals and nursing note reviewed. Exam conducted with a chaperone present.  Constitutional:      General: He is not in acute distress.    Appearance: He is well-developed and normal weight. He is not ill-appearing.     Comments: Patient resting in bed; NAD  HENT:     Head: Normocephalic and  atraumatic.  Eyes:     General: No scleral icterus.    Extraocular Movements: Extraocular movements intact.  Pulmonary:     Effort: Pulmonary effort is normal. No respiratory distress.  Abdominal:     General: Abdomen is flat. A surgical scar is present. There is distension.     Palpations: Abdomen is soft.     Tenderness: There is abdominal tenderness in the periumbilical area. There is no guarding or rebound.     Comments: Abdomen is soft, he is tender centrally over an area of localized distension, no rebound/guarding. He is certainly not peritonitic. He does have a well healed midline and right para-midline scar laparotomy scar.   Genitourinary:    Comments: Deferred Skin:    General: Skin is warm and dry.     Coloration: Skin is not jaundiced.     Findings: No erythema.  Neurological:     General: No focal deficit present.     Mental Status: He is alert and oriented to person, place, and time.      Labs:     Latest Ref Rng & Units 06/07/2023    8:42 AM 10/18/2022   12:30 AM 10/16/2022   11:30 PM  CBC  WBC 4.0 - 10.5 K/uL 6.0  9.7  10.0   Hemoglobin 13.0 - 17.0 g/dL 08.6  8.9  8.9   Hematocrit  39.0 - 52.0 % 36.9  27.5  27.9   Platelets 150 - 400 K/uL 190  480  414       Latest Ref Rng & Units 06/07/2023    8:42 AM 10/19/2022   12:33 AM 10/18/2022   12:30 AM  CMP  Glucose 70 - 99 mg/dL 578  469  629   BUN 8 - 23 mg/dL 20  13  13    Creatinine 0.61 - 1.24 mg/dL 5.28  4.13  2.44   Sodium 135 - 145 mmol/L 141  136  137   Potassium 3.5 - 5.1 mmol/L 3.9  4.1  3.8   Chloride 98 - 111 mmol/L 105  101  103   CO2 22 - 32 mmol/L 25  22  24    Calcium 8.9 - 10.3 mg/dL 9.2  8.9  8.7   Total Protein 6.5 - 8.1 g/dL 6.8     Total Bilirubin <1.2 mg/dL 1.4     Alkaline Phos 38 - 126 U/L 20     AST 15 - 41 U/L 30     ALT 0 - 44 U/L 24        Imaging studies:   CT Abdomen/Pelvis (06/07/2023) personally reviewed with small bowel dilation and transition at/near previous small bowel anastomosis, no free air, no pneumatosis, and radiologist report reviewed below: IMPRESSION: Dilated small bowel centrally with some stranding. Transition in the central pelvis at the suture line. The findings are consistent with a developing or partial obstruction. No pneumatosis, free air or rim enhancing fluid collection.   Minimal ectasia of the right renal collecting system, nonspecific. No delayed enhancement or excretion. Nonobstructing left-sided renal stones.   Hepatic hemangiomas.   Gallstones.   Assessment/Plan: (ICD-10's: K47.609) 77 y.o. male with small bowel obstruction likely secondary to post-surgical adhesive disease vs narrowing of previous small bowel anastomosis, complicated by pertinent comorbidities including Hx of CABG on anticoagulation, TIA, PAF.   - Appreciate medicine admission - I think we can hold off on NGT placement for now given improvement in symptoms and lack of nausea/emesis. He understands that if these were  to develop/worsen, we would recommend placement of this - No need for emergent surgical intervention. He understands that should he fail to improve or worsen in any  fashion, we would need to consider this more urgently.  - NPO + IVF support - Monitor abdominal examination; on-going bowel function - Serial KUBs as needed; gastrografin challenge in 24-48 if no progress  - Pain control prn; antiemetics prn   - Mobilize as tolerated   - Further management per primary service; we will follow   All of the above findings and recommendations were discussed with the patient, and all of patient's questions were answered to his expressed satisfaction.  Thank you for the opportunity to participate in this patient's care.   -- Lynden Oxford, PA-C [facility] 06/07/2023, 2:47 PM M-F: 7am - 4pm

## 2023-06-07 NOTE — Assessment & Plan Note (Addendum)
Rate controlled at present On amiodarone and Eliquis Hold for now until po intake improves Consider subcutaneous lovenox if SBO protracted  Monitor

## 2023-06-07 NOTE — Assessment & Plan Note (Signed)
2D echo March 2024 with EF of 55 to 60% Mildly dry on exam Gentle IV fluids Monitor volume status closely

## 2023-06-07 NOTE — Assessment & Plan Note (Signed)
Remote history of TIA on aspirin and statin Monitor

## 2023-06-07 NOTE — Assessment & Plan Note (Signed)
NSTEMI 10/02/2022 with cardiac catheterization revealing  75% stenosis ostial left main, 100% stenosis distal left main/ostial LAD, 90% stenosis ostial RCA s/p CABG x 3 at [facility] 10/05/2022  No active chest pain Continue home regimen

## 2023-06-07 NOTE — Assessment & Plan Note (Addendum)
Worsening abdominal pain over past 24 hours with noted early small bowel obstruction on CT scan today General Surgery formally consulted N.p.o. IV fluids Antiemetics Pain control Deferring NG tube for now per preliminary general surgery recommendations Monitor

## 2023-06-07 NOTE — H&P (Addendum)
History and Physical    Patient: Ruben [name] NAT:[id number] DOB: 06-Jun-1946 DOA: 06/07/2023 DOS: the patient was seen and examined on 06/07/2023 PCP: Kandyce Rud, MD  Patient coming from: Home  Chief Complaint:  Chief Complaint  Patient presents with   Abdominal Pain   HPI: Ruben [name] is a 77 y.o. male with medical history significant of coronary artery disease status post CABG, atrial fibrillation on Eliquis, TIA, HFpEF, history of SBO presenting with small bowel obstruction.  Patient reports progressively worsening abdominal pain over the past 12 to 24 hours.  # Onset of moderate to severe abdominal pain around 1 AM.  Patient reports partially eating his food last night. No fevers or chills.  Patient reports remote history of Meckel's diverticulum with partial colectomy and appendectomy in 1998.  Patient reports episode of small bowel obstruction around 2004.  Has been otherwise stable.  No reported change in diet apart from partially eating food last night.  Baseline cardiac disease followed by [facility] clinic.  Had CABG done in March of this year.  No recent change in medication.  No active chest pain. Presented to the ER afebrile, hemodynamically stable.  Satting well on room air.  White count 6, hemoglobin 12, platelets 190, lipase 55, creatinine 1.3, urinalysis not indicative of infection.  CT abdomen pelvis with dilated small bowel essentially with stranding with findings concerning for developing partial bowel obstruction. Review of Systems: As mentioned in the history of present illness. All other systems reviewed and are negative. Past Medical History:  Diagnosis Date   TIA (transient ischemic attack)    Past Surgical History:  Procedure Laterality Date   CLIPPING OF ATRIAL APPENDAGE  10/05/2022   Procedure: CLIPPING OF ATRIAL APPENDAGE;  Surgeon: Alleen Borne, MD;  Location: [facility] OR;  Service: Open Heart Surgery;;   CORONARY ARTERY BYPASS GRAFT N/A 10/05/2022    Procedure: CORONARY ARTERY BYPASS GRAFTING (CABG) X THREE BYPASSES USING OPEN LEFT INTERNAL MAMMARY ARTERY AND ENDOSCOPIC RIGHT GREATER SAPHENOUS VEIN HARVEST; CLIPPING OF ATRIAL APPENDAGE WITH ATRICLIP;  Surgeon: Alleen Borne, MD;  Location: [facility] OR;  Service: Open Heart Surgery;  Laterality: N/A;   LEFT HEART CATH AND CORONARY ANGIOGRAPHY N/A 10/02/2022   Procedure: LEFT HEART CATH AND CORONARY ANGIOGRAPHY;  Surgeon: Alwyn Pea, MD;  Location: [facility] INVASIVE CV LAB;  Service: Cardiovascular;  Laterality: N/A;   SHOULDER ARTHROSCOPY     TEE WITHOUT CARDIOVERSION N/A 10/05/2022   Procedure: TRANSESOPHAGEAL ECHOCARDIOGRAM;  Surgeon: Alleen Borne, MD;  Location: [facility] OR;  Service: Open Heart Surgery;  Laterality: N/A;   Social History:  reports that he has never smoked. He has never used smokeless tobacco. He reports that he does not currently use alcohol. He reports that he does not use drugs.  Allergies  Allergen Reactions   Codeine Nausea Only   Naproxen Sodium Other (See Comments)    Bleeding ulcers    Family History  Problem Relation Age of Onset   Colon cancer Mother    Heart disease Father     Prior to Admission medications   Medication Sig Start Date End Date Taking? Authorizing Provider  amiodarone (PACERONE) 200 MG tablet Take 1 tablet (200 mg total) by mouth 2 (two) times daily. For 7 days then reduce the dose to 1 tablet (200 mg) twice daily. 10/11/22  Yes Gold, Wayne E, PA-C  apixaban (ELIQUIS) 5 MG TABS tablet Take 1 tablet (5 mg total) by mouth 2 (two) times daily. 10/11/22  Yes Rowe Clack, PA-C  aspirin EC 81 MG tablet Take 1 tablet (81 mg total) by mouth daily. Swallow whole. 10/11/22  Yes Gold, Glenice Laine, PA-C  cholecalciferol (VITAMIN D3) 25 MCG (1000 UNIT) tablet Take 1,000 Units by mouth daily.   Yes [provider]  metoprolol succinate (TOPROL-XL) 25 MG 24 hr tablet Take 1 tablet (25 mg total) by mouth daily. 10/20/22  Yes Standley Brooking, MD   rosuvastatin (CRESTOR) 20 MG tablet Take 2 tablets (40 mg total) by mouth at bedtime. 10/02/22 10/02/23 Yes Callwood, Dwayne D, MD  vitamin B-12 (CYANOCOBALAMIN) 1000 MCG tablet Take 1,000 mcg by mouth daily.   Yes [provider]  vitamin C (ASCORBIC ACID) 250 MG tablet Take 250 mg by mouth daily.   Yes [provider]  Polyvinyl Alcohol-Povidone (REFRESH OP) Place 1 drop into both eyes as needed (dry eye).    [provider]    Physical Exam: Vitals:   06/07/23 0839 06/07/23 1000 06/07/23 1010 06/07/23 1242  BP: (!) 157/81 (!) 84/42 (!) 127/56 (!) 164/66  Pulse: 75 (!) 44 (!) 47 60  Resp: 17   16  Temp: 97.7 F (36.5 C)   98 F (36.7 C)  TempSrc: Axillary   Oral  SpO2: 100% 100% 100% 100%  Weight:      Height:       Physical Exam Constitutional:      Appearance: He is normal weight.  HENT:     Head: Normocephalic and atraumatic.     Nose: Nose normal.     Mouth/Throat:     Mouth: Mucous membranes are moist.  Eyes:     Pupils: Pupils are equal, round, and reactive to light.  Cardiovascular:     Rate and Rhythm: Normal rate and regular rhythm.  Pulmonary:     Effort: Pulmonary effort is normal.  Abdominal:     General: Bowel sounds are normal.     Comments: Mild periumbilical TTP  + bowel sounds    Musculoskeletal:        General: Normal range of motion.  Skin:    General: Skin is warm.  Neurological:     General: No focal deficit present.  Psychiatric:        Mood and Affect: Mood normal.     Data Reviewed:  There are no new results to review at this time.  CT ABDOMEN PELVIS W CONTRAST CLINICAL DATA:  Periumbilical and right lower quadrant abdominal pain and tenderness for the last 12 hours. History of a resection of a Meckel's diverticulum.  EXAM: CT ABDOMEN AND PELVIS WITH CONTRAST  TECHNIQUE: Multidetector CT imaging of the abdomen and pelvis was performed using the standard protocol following bolus administration  of intravenous contrast.  RADIATION DOSE REDUCTION: This exam was performed according to the departmental dose-optimization program which includes automated exposure control, adjustment of the mA and/or kV according to patient size and/or use of iterative reconstruction technique.  CONTRAST:  OMNIPAQUE IOHEXOL 300 MG/ML  SOLN  COMPARISON:  CT 2005 December.  FINDINGS: Lower chest: Is some linear opacity lung bases likely scar or atelectasis. No pleural effusion. Calcified nodule in the right lung base consistent with old granulomatous disease, unchanged. Prominent coronary artery calcifications are seen. Surgical changes.  Hepatobiliary: Enhancing focus in segment 7 of the liver today measuring 16 mm. Of interest going back 2005 there is a small lesion in the same location. At that time this measured 5 mm. Again felt to  be a atypical flash fill hemangioma. Smaller focus in segment 2. Patent portal vein. Tiny low-attenuation lesion seen in segment 4 of the dome on series 3, image 20. Too small to completely characterize but likely a benign cystic lesion and no specific follow-up. Stone in the nondilated gallbladder.  Pancreas: Unremarkable. No pancreatic ductal dilatation or surrounding inflammatory changes.  Spleen: Normal in size without focal abnormality.  Adrenals/Urinary Tract: Adrenal glands are preserved. Mild right renal atrophy. Nonobstructing lower pole left-sided renal stones. More focal atrophy of the lower pole left kidney. There also some tiny low-attenuation lesions along each kidney which are too small to completely characterize but favor benign cystic areas. Bosniak 1 and 2. No specific imaging follow-up. There is slight ectasia of the right renal collecting system but no delayed enhancement or excretion. Caliber change of the ureter at the level of the upper pelvis and common iliac vessel. Preserved contours of the urinary bladder.  Stomach/Bowel: On  this non oral contrast exam, large bowel has a normal course and caliber. Scattered colonic stool left-sided colonic diverticula more than right. Surgical changes in the area of the base of the cecum. Please correlate with the prior appendectomy. Stomach is nondilated. Fundal posterior duodenal diverticulum. There are several fluid-filled loops of dilated small bowel in the midabdomen. Adjacent mild stranding and fluid. There is 1 particular loop in the central pelvis which is more dilated at the margin of the suture line measuring up to 6.3 cm with small bowel stool appearance in this location and associated caliber change. The small bowel distal to this area is decompressed. Findings are consistent with a developing or partial obstruction. No pneumatosis, free air or portal venous gas.  Vascular/Lymphatic: Aortic atherosclerosis. No enlarged abdominal or pelvic lymph nodes.  Reproductive: Prostate is unremarkable.  Other: Tiny fat containing umbilical hernia.  Musculoskeletal: No acute or significant osseous findings.  IMPRESSION: Dilated small bowel centrally with some stranding. Transition in the central pelvis at the suture line. The findings are consistent with a developing or partial obstruction. No pneumatosis, free air or rim enhancing fluid collection.  Minimal ectasia of the right renal collecting system, nonspecific. No delayed enhancement or excretion. Nonobstructing left-sided renal stones.  Hepatic hemangiomas.  Gallstones.  Electronically Signed   By: Karen Kays M.D.   On: 06/07/2023 14:04  Lab Results  Component Value Date   WBC 6.0 06/07/2023   HGB 12.0 (L) 06/07/2023   HCT 36.9 (L) 06/07/2023   MCV 101.9 (H) 06/07/2023   PLT 190 06/07/2023   Last metabolic panel Lab Results  Component Value Date   GLUCOSE 108 (H) 06/07/2023   NA 141 06/07/2023   K 3.9 06/07/2023   CL 105 06/07/2023   CO2 25 06/07/2023   BUN 20 06/07/2023   CREATININE 1.30  (H) 06/07/2023   GFRNONAA 57 (L) 06/07/2023   CALCIUM 9.2 06/07/2023   PROT 6.8 06/07/2023   ALBUMIN 3.9 06/07/2023   BILITOT 1.4 (H) 06/07/2023   ALKPHOS 20 (L) 06/07/2023   AST 30 06/07/2023   ALT 24 06/07/2023   ANIONGAP 11 06/07/2023    Assessment and Plan: * SBO (small bowel obstruction) (HCC) Worsening abdominal pain over past 24 hours with noted early small bowel obstruction on CT scan today General Surgery formally consulted N.p.o. IV fluids Antiemetics Pain control Deferring NG tube for now per preliminary general surgery recommendations Monitor  (HFpEF) heart failure with preserved ejection fraction [facility]) 2D echo March 2024 with EF of 55 to 60% Mildly  dry on exam Gentle IV fluids Monitor volume status closely  S/P CABG x 3 NSTEMI 10/02/2022 with cardiac catheterization revealing  75% stenosis ostial left main, 100% stenosis distal left main/ostial LAD, 90% stenosis ostial RCA s/p CABG x 3 at [facility] 10/05/2022  No active chest pain Continue home regimen  Atrial fibrillation, chronic (HCC) Rate controlled at present On amiodarone and Eliquis Hold for now until po intake improves Consider subcutaneous lovenox if SBO protracted  Monitor  Hyperlipidemia Hold statin for now  TIA (transient ischemic attack) Remote history of TIA on aspirin and statin Monitor      Advance Care Planning:   Code Status: Prior   Consults: General Surgery   Family Communication: No family at the bedside   Severity of Illness: The appropriate patient status for this patient is INPATIENT. Inpatient status is judged to be reasonable and necessary in order to provide the required intensity of service to ensure the patient's safety. The patient's presenting symptoms, physical exam findings, and initial radiographic and laboratory data in the context of their chronic comorbidities is felt to place them at high risk for further clinical deterioration. Furthermore, it is not anticipated  that the patient will be medically stable for discharge from the hospital within 2 midnights of admission.   * I certify that at the point of admission it is my clinical judgment that the patient will require inpatient hospital care spanning beyond 2 midnights from the point of admission due to high intensity of service, high risk for further deterioration and high frequency of surveillance required.*  Author: Floydene Flock, MD 06/07/2023 2:46 PM  For on call review [email or web address].

## 2023-06-07 NOTE — ED Notes (Signed)
Up to bsc  Had mod amt of stool  Pt became pale  Placed back in bed color improved slightly Family at bedside

## 2023-06-07 NOTE — ED Triage Notes (Addendum)
Pt presents to ED with c/o of umbilical ABD pain, pain started last night. Pt denies N/V. Pt states ABD surgeries in the past.

## 2023-06-07 NOTE — Progress Notes (Signed)
Order received from Dr Alvester Morin for ice chips

## 2023-06-07 NOTE — ED Notes (Signed)
After sucessfully placing this Pts IV, Pt reports new onset dizziness, nausea, urge to have a BM and started to become pale looking. VS obtained, BP was initially 82/42, retaken mins later and improved to 100/50. BP was trending up and after a few seconds Pts color returned and he started to report an improvement in his symptoms. MD notified and VSS will be rechecked until return to baseline. GCS 15

## 2023-06-08 DIAGNOSIS — K56609 Unspecified intestinal obstruction, unspecified as to partial versus complete obstruction: Secondary | ICD-10-CM | POA: Diagnosis not present

## 2023-06-08 LAB — COMPREHENSIVE METABOLIC PANEL
ALT: 20 U/L (ref 0–44)
AST: 27 U/L (ref 15–41)
Albumin: 3.5 g/dL (ref 3.5–5.0)
Alkaline Phosphatase: 12 U/L — ABNORMAL LOW (ref 38–126)
Anion gap: 7 (ref 5–15)
BUN: 20 mg/dL (ref 8–23)
CO2: 24 mmol/L (ref 22–32)
Calcium: 8.5 mg/dL — ABNORMAL LOW (ref 8.9–10.3)
Chloride: 106 mmol/L (ref 98–111)
Creatinine, Ser: 1.25 mg/dL — ABNORMAL HIGH (ref 0.61–1.24)
GFR, Estimated: 60 mL/min — ABNORMAL LOW (ref >60)
Glucose, Bld: 152 mg/dL — ABNORMAL HIGH (ref 70–99)
Potassium: 3.7 mmol/L (ref 3.5–5.1)
Sodium: 137 mmol/L (ref 135–145)
Total Bilirubin: 1.4 mg/dL — ABNORMAL HIGH (ref <1.2)
Total Protein: 6.1 g/dL — ABNORMAL LOW (ref 6.5–8.1)

## 2023-06-08 LAB — CBC
HCT: 35.2 % — ABNORMAL LOW (ref 39.0–52.0)
Hemoglobin: 11.8 g/dL — ABNORMAL LOW (ref 13.0–17.0)
MCH: 33.3 pg (ref 26.0–34.0)
MCHC: 33.5 g/dL (ref 30.0–36.0)
MCV: 99.4 fL (ref 80.0–100.0)
Platelets: 172 10*3/uL (ref 150–400)
RBC: 3.54 MIL/uL — ABNORMAL LOW (ref 4.22–5.81)
RDW: 13.1 % (ref 11.5–15.5)
WBC: 4.4 10*3/uL (ref 4.0–10.5)
nRBC: 0 % (ref 0.0–0.2)

## 2023-06-08 MED ORDER — AMIODARONE HCL 200 MG PO TABS: 200.0000 mg | ORAL_TABLET | Freq: Every day | ORAL | Status: DC

## 2023-06-08 MED ORDER — ROSUVASTATIN CALCIUM 20 MG PO TABS
40.0000 mg | ORAL_TABLET | Freq: Every day | ORAL | Status: DC
  Administered 2023-06-08 – 2023-06-10 (×3): 40 mg via ORAL
  Filled 2023-06-08 (×2): qty 2
  Filled 2023-06-08 (×2): qty 4
  Filled 2023-06-08: qty 2

## 2023-06-08 MED ORDER — METOPROLOL SUCCINATE ER 25 MG PO TB24
25.0000 mg | ORAL_TABLET | Freq: Every day | ORAL | Status: DC
  Administered 2023-06-08 – 2023-06-11 (×4): 25 mg via ORAL
  Filled 2023-06-08 (×4): qty 1

## 2023-06-08 MED ORDER — ASPIRIN 81 MG PO TBEC
81.0000 mg | DELAYED_RELEASE_TABLET | Freq: Every day | ORAL | Status: DC
  Administered 2023-06-08 – 2023-06-11 (×4): 81 mg via ORAL
  Filled 2023-06-08 (×4): qty 1

## 2023-06-08 MED ORDER — AMIODARONE HCL 200 MG PO TABS
200.0000 mg | ORAL_TABLET | Freq: Two times a day (BID) | ORAL | Status: DC
  Administered 2023-06-08 – 2023-06-11 (×6): 200 mg via ORAL
  Filled 2023-06-08 (×6): qty 1

## 2023-06-08 MED ORDER — AMIODARONE HCL 200 MG PO TABS: 200.0000 mg | ORAL_TABLET | Freq: Two times a day (BID) | ORAL | Status: DC

## 2023-06-08 NOTE — Progress Notes (Signed)
Progress Note   Patient: Ruben [name] LKG:[id number] DOB: 08/09/1945 DOA: 06/07/2023     1 DOS: the patient was seen and examined on 06/08/2023   Brief hospital course: Ruben [name] is a 77 y.o. male with medical history significant of coronary artery disease status post CABG, atrial fibrillation on Eliquis, TIA, HFpEF, history of SBO presenting with small bowel obstruction.  Patient reports progressively worsening abdominal pain over the past 12 to 24 hours.  # Onset of moderate to severe abdominal pain around 1 AM.  Patient reports partially eating his food last night. No fevers or chills.  Patient reports remote history of Meckel's diverticulum with partial colectomy and appendectomy in 1998.  Patient reports episode of small bowel obstruction around 2004.  Has been otherwise stable.  No reported change in diet apart from partially eating food last night.  Baseline cardiac disease followed by [facility] clinic.  Had CABG done in March of this year.  No recent change in medication.  No active chest pain. Presented to the ER afebrile, hemodynamically stable.  Satting well on room air.  White count 6, hemoglobin 12, platelets 190, lipase 55, creatinine 1.3, urinalysis not indicative of infection.  CT abdomen pelvis with dilated small bowel essentially with stranding with findings concerning for developing partial bowel obstruction.  Assessment and Plan: SBO (small bowel obstruction) (HCC) CT scan of the abdomen showed findings of bowel obstruction General Surgery on board and case discussed Continue IV fluid Keep n.p.o. for now as recommended by surgery team Continue antiemetics Pain control Serial abdominal imaging by surgery team   (HFpEF) heart failure with preserved ejection fraction (HCC) 2D echo March 2024 with EF of 55 to 60% Mildly dry on exam As patient is n.p.o. surgery team is keeping on maintenance IV fluid cautiously given history of CHF Monitor input and output closely    S/P CABG x 3 NSTEMI 10/02/2022 with cardiac catheterization revealing  75% stenosis ostial left main, 100% stenosis distal left main/ostial LAD, 90% stenosis ostial RCA s/p CABG x 3 at [facility] 10/05/2022  No active chest pain Continue home medications when able to take oral   Atrial fibrillation, chronic (HCC) Rate controlled at present On amiodarone and Eliquis Hold for now until po intake improves Consider subcutaneous lovenox if SBO protracted   Hyperlipidemia Hold statin for now   TIA (transient ischemic attack) Remote history of TIA on aspirin and statin Monitor    Advance Care Planning:   Code Status: Prior    Consults: General Surgery    Family Communication: No family at the bedside    Subjective:  Admits to improvement in abdominal pain and denies any vomiting today He did have some nausea yesterday but none today  Physical Exam: Appearance: He is normal weight.  HENT:     Head: Normocephalic and atraumatic.     Nose: Nose normal.     Mouth/Throat:     Mouth: Mucous membranes are moist.  Eyes:     Pupils: Pupils are equal, round, and reactive to light.  Cardiovascular:     Rate and Rhythm: Normal rate and regular rhythm.  Pulmonary:     Effort: Pulmonary effort is normal.  Abdominal: Mild tenderness around umbilicus area to the right Musculoskeletal:        General: Normal range of motion.  Skin:    General: Skin is warm.  Neurological:     General: No focal deficit present.  Psychiatric:     Vitals:   06/07/23  1742 06/07/23 1952 06/08/23 0356 06/08/23 0806  BP: (!) 137/53 (!) 136/49 98/81 (!) 121/54  Pulse: 63 66 63 77  Resp: 18 18 18 18   Temp: 99.8 F (37.7 C) 98.8 F (37.1 C) 99.5 F (37.5 C) 100 F (37.8 C)  TempSrc: Oral Oral Oral Oral  SpO2: 97% 100% 92% 96%  Weight:      Height:        Data Reviewed: CT scan of the abdomen and pelvics reviewed showing dilated small loops of bowel concerning for small bowel obstruction    Latest Ref Rng  & Units 06/08/2023    4:18 AM 06/07/2023    8:42 AM 10/18/2022   12:30 AM  CBC  WBC 4.0 - 10.5 K/uL 4.4  6.0  9.7   Hemoglobin 13.0 - 17.0 g/dL 16.1  09.6  8.9   Hematocrit 39.0 - 52.0 % 35.2  36.9  27.5   Platelets 150 - 400 K/uL 172  190  480        Latest Ref Rng & Units 06/08/2023    4:18 AM 06/07/2023    8:42 AM 10/19/2022   12:33 AM  BMP  Glucose 70 - 99 mg/dL 045  409  811   BUN 8 - 23 mg/dL 20  20  13    Creatinine 0.61 - 1.24 mg/dL 9.14  7.82  9.56   Sodium 135 - 145 mmol/L 137  141  136   Potassium 3.5 - 5.1 mmol/L 3.7  3.9  4.1   Chloride 98 - 111 mmol/L 106  105  101   CO2 22 - 32 mmol/L 24  25  22    Calcium 8.9 - 10.3 mg/dL 8.5  9.2  8.9      Disposition: Status is: Inpatient  Time spent: 58 minutes  Author: Loyce Dys, MD 06/08/2023 1:57 PM  For on call review [email or web address].

## 2023-06-08 NOTE — Progress Notes (Signed)
[facility] SURGICAL PROGRESS NOTE (cpt 7121904882)  Hospital Day(s): 1.   Interval History: Patient seen and examined, no acute events or new complaints overnight. Patient reports he is feeling better this morning form a pain perspective. He did report a small amount of nausea and scant emesis overnight but thinks this was secondary to IV narcotic medications. Temp 100F this AM. Work up this morning showed a normal WBC to 4.4K, Hgb to 11.8, sCr - 1.25. He has been NPO. No reports of Flatus.   Review of Systems:  Constitutional: denies fever, chills  HEENT: denies cough or congestion  Respiratory: denies any shortness of breath  Cardiovascular: denies chest pain or palpitations  Gastrointestinal: denies abdominal pain, N/V Genitourinary: denies burning with urination or urinary frequency Musculoskeletal: denies pain, decreased motor or sensation  Vital signs in last 24 hours: [min-max] current  Temp:  [97.7 F (36.5 C)-99.8 F (37.7 C)] 99.5 F (37.5 C) (11/19 0356) Pulse Rate:  [44-75] 63 (11/19 0356) Resp:  [16-18] 18 (11/19 0356) BP: (84-164)/(42-81) 98/81 (11/19 0356) SpO2:  [92 %-100 %] 92 % (11/19 0356) Weight:  [61.2 kg] 61.2 kg (11/18 0834)     Height: 5\' 8"  (172.7 cm) Weight: 61.2 kg BMI (Calculated): 20.53   Intake/Output last 2 shifts:  No intake/output data recorded.   Physical Exam:  Constitutional: alert, cooperative and no distress  HENT: normocephalic without obvious abnormality  Eyes: PERRL, EOM's grossly intact and symmetric  Respiratory: breathing non-labored at rest  Cardiovascular: regular rate and sinus rhythm  Gastrointestinal: soft, he remains distended centrally similar to yesterday, no significant tenderness, no rebound/guarding. He is certainly not peritonitic Musculoskeletal: no edema or wounds, motor and sensation grossly intact, NT    Labs:     Latest Ref Rng & Units 06/08/2023    4:18 AM 06/07/2023    8:42 AM 10/18/2022   12:30 AM   CBC  WBC 4.0 - 10.5 K/uL 4.4  6.0  9.7   Hemoglobin 13.0 - 17.0 g/dL 29.5  18.8  8.9   Hematocrit 39.0 - 52.0 % 35.2  36.9  27.5   Platelets 150 - 400 K/uL 172  190  480       Latest Ref Rng & Units 06/08/2023    4:18 AM 06/07/2023    8:42 AM 10/19/2022   12:33 AM  CMP  Glucose 70 - 99 mg/dL 416  606  301   BUN 8 - 23 mg/dL 20  20  13    Creatinine 0.61 - 1.24 mg/dL 6.01  0.93  2.35   Sodium 135 - 145 mmol/L 137  141  136   Potassium 3.5 - 5.1 mmol/L 3.7  3.9  4.1   Chloride 98 - 111 mmol/L 106  105  101   CO2 22 - 32 mmol/L 24  25  22    Calcium 8.9 - 10.3 mg/dL 8.5  9.2  8.9   Total Protein 6.5 - 8.1 g/dL 6.1  6.8    Total Bilirubin <1.2 mg/dL 1.4  1.4    Alkaline Phos 38 - 126 U/L 12  20    AST 15 - 41 U/L 27  30    ALT 0 - 44 U/L 20  24       Imaging studies: No new pertinent imaging studies   Assessment/Plan: (ICD-10's: K27.609) 77 y.o. male with small bowel obstruction likely secondary to post-surgical adhesive disease vs narrowing of previous small bowel anastomosis, complicated by pertinent comorbidities including Hx of CABG  on anticoagulation, TIA, PAF.   - Will continue NPO today + IVF support - No need for NGT currently. He understands we will recommend this should nausea/emesis or pain worsen - No need for emergent surgical intervention. He understands that should he fail to improve or worsen in any fashion, we would need to consider this more urgently.  - NPO + IVF support - Monitor abdominal examination; on-going bowel function - Serial KUBs in AM; gastrografin challenge tomorrow (11/20) if no improvement             - Pain control prn; antiemetics prn              - Mobilize as tolerated              - Further management per primary service; we will follow   All of the above findings and recommendations were discussed with the patient, patient's family (wife at bedside), and the medical team, and all of patient's and family's questions were answered to their  expressed satisfaction.  -- Lynden Oxford, PA-C [facility] 06/08/2023, 7:48 AM M-F: 7am - 4pm

## 2023-06-08 NOTE — Plan of Care (Signed)

## 2023-06-08 NOTE — TOC CM/SW Note (Signed)
Transition of Care [facility]) - Inpatient Brief Assessment   Patient Details  Name: Ruben [name] MRN: [id number] Date of Birth: 1946/06/12  Transition of Care [facility]) CM/SW Contact:    Chapman Fitch, RN Phone Number: 06/08/2023, 10:11 AM   Clinical Narrative:   Transition of Care [facility]) Screening Note   Patient Details  Name: Ruben [name] Date of Birth: 04-Oct-1945   Transition of Care [facility]) CM/SW Contact:    Chapman Fitch, RN Phone Number: 06/08/2023, 10:11 AM    Transition of Care Department [facility]) has reviewed patient and no TOC needs have been identified at this time. If new patient transition needs arise, please place a TOC consult.    Transition of Care Asessment: Insurance and Status: Insurance coverage has been reviewed Patient has primary care physician: Yes     Prior/Current Home Services: No current home services Social Determinants of Health Reivew: SDOH reviewed no interventions necessary Readmission risk has been reviewed: Yes Transition of care needs: no transition of care needs at this time

## 2023-06-09 ENCOUNTER — Inpatient Hospital Stay: Payer: PPO

## 2023-06-09 DIAGNOSIS — K56609 Unspecified intestinal obstruction, unspecified as to partial versus complete obstruction: Secondary | ICD-10-CM | POA: Diagnosis not present

## 2023-06-09 LAB — CBC
HCT: 32 % — ABNORMAL LOW (ref 39.0–52.0)
Hemoglobin: 10.7 g/dL — ABNORMAL LOW (ref 13.0–17.0)
MCH: 32.8 pg (ref 26.0–34.0)
MCHC: 33.4 g/dL (ref 30.0–36.0)
MCV: 98.2 fL (ref 80.0–100.0)
Platelets: 162 10*3/uL (ref 150–400)
RBC: 3.26 MIL/uL — ABNORMAL LOW (ref 4.22–5.81)
RDW: 13.2 % (ref 11.5–15.5)
WBC: 3.6 10*3/uL — ABNORMAL LOW (ref 4.0–10.5)
nRBC: 0 % (ref 0.0–0.2)

## 2023-06-09 LAB — BASIC METABOLIC PANEL
Anion gap: 8 (ref 5–15)
BUN: 17 mg/dL (ref 8–23)
CO2: 22 mmol/L (ref 22–32)
Calcium: 8.7 mg/dL — ABNORMAL LOW (ref 8.9–10.3)
Chloride: 109 mmol/L (ref 98–111)
Creatinine, Ser: 1.25 mg/dL — ABNORMAL HIGH (ref 0.61–1.24)
GFR, Estimated: 60 mL/min — ABNORMAL LOW (ref >60)
Glucose, Bld: 89 mg/dL (ref 70–99)
Potassium: 3.5 mmol/L (ref 3.5–5.1)
Sodium: 139 mmol/L (ref 135–145)

## 2023-06-09 LAB — PHOSPHORUS: Phosphorus: 3.6 mg/dL (ref 2.5–4.6)

## 2023-06-09 LAB — MAGNESIUM: Magnesium: 2.2 mg/dL (ref 1.7–2.4)

## 2023-06-09 NOTE — Plan of Care (Signed)
Tolerating clear liquid diet, advancing diet as tolerated Problem: Education: Goal: Knowledge of General Education information will improve Description: Including pain rating scale, medication(s)/side effects and non-pharmacologic comfort measures Outcome: Progressing   Problem: Health Behavior/Discharge Planning: Goal: Ability to manage health-related needs will improve Outcome: Progressing   Problem: Clinical Measurements: Goal: Ability to maintain clinical measurements within normal limits will improve Outcome: Progressing Goal: Will remain free from infection Outcome: Progressing Goal: Diagnostic test results will improve Outcome: Progressing Goal: Respiratory complications will improve Outcome: Progressing Goal: Cardiovascular complication will be avoided Outcome: Progressing   Problem: Activity: Goal: Risk for activity intolerance will decrease Outcome: Progressing   Problem: Nutrition: Goal: Adequate nutrition will be maintained Outcome: Progressing   Problem: Coping: Goal: Level of anxiety will decrease Outcome: Progressing   Problem: Elimination: Goal: Will not experience complications related to bowel motility Outcome: Progressing Goal: Will not experience complications related to urinary retention Outcome: Progressing   Problem: Pain Management: Goal: General experience of comfort will improve Outcome: Progressing   Problem: Safety: Goal: Ability to remain free from injury will improve Outcome: Progressing   Problem: Skin Integrity: Goal: Risk for impaired skin integrity will decrease Outcome: Progressing

## 2023-06-09 NOTE — Progress Notes (Signed)
[facility] SURGICAL PROGRESS NOTE (cpt [phone])  Hospital Day(s): 2.   Interval History: Patient seen and examined, no acute events or new complaints overnight. Patient reports he is feeling better this morning. No abdominal pain. No fever, chills, nausea, emesis. Labs this morning show very mild leukopenia to 3.6K. Hgb to 10.7; suspect dilutional. Renal function is baseline; sCr - 1.25; UO - 350 ccs + unmeasured. No electrolyte derangements. KUB this morning shows gas throughout the colon. He continues to pass multiple episode of flatus.   Review of Systems:  Constitutional: denies fever, chills  HEENT: denies cough or congestion  Respiratory: denies any shortness of breath  Cardiovascular: denies chest pain or palpitations  Gastrointestinal: denies abdominal pain, N/V Genitourinary: denies burning with urination or urinary frequency Musculoskeletal: denies pain, decreased motor or sensation  Vital signs in last 24 hours: [min-max] current  Temp:  [98.4 F (36.9 C)-100 F (37.8 C)] 98.9 F (37.2 C) (11/20 0412) Pulse Rate:  [61-77] 68 (11/20 0412) Resp:  [16-18] 18 (11/20 0412) BP: (121-126)/(47-61) 125/47 (11/20 0412) SpO2:  [96 %-98 %] 98 % (11/20 0412)     Height: 5\' 8"  (172.7 cm) Weight: 61.2 kg BMI (Calculated): 20.53   Intake/Output last 2 shifts:  11/19 0701 - 11/20 0700 In: 2372.1 [I.V.:2372.1] Out: 350 [Urine:350]   Physical Exam:  Constitutional: alert, cooperative and no distress  HENT: normocephalic without obvious abnormality  Eyes: PERRL, EOM's grossly intact and symmetric  Respiratory: breathing non-labored at rest  Cardiovascular: regular rate and sinus rhythm  Gastrointestinal: soft, previous distension appears resolved, no significant tenderness, no rebound/guarding. He is certainly not peritonitic Musculoskeletal: no edema or wounds, motor and sensation grossly intact, NT    Labs:     Latest Ref Rng & Units 06/09/2023    4:41 AM  06/08/2023    4:18 AM 06/07/2023    8:42 AM  CBC  WBC 4.0 - 10.5 K/uL 3.6  4.4  6.0   Hemoglobin 13.0 - 17.0 g/dL 46.9  62.9  52.8   Hematocrit 39.0 - 52.0 % 32.0  35.2  36.9   Platelets 150 - 400 K/uL 162  172  190       Latest Ref Rng & Units 06/09/2023    4:41 AM 06/08/2023    4:18 AM 06/07/2023    8:42 AM  CMP  Glucose 70 - 99 mg/dL 89  413  244   BUN 8 - 23 mg/dL 17  20  20    Creatinine 0.61 - 1.24 mg/dL 0.10  2.72  5.36   Sodium 135 - 145 mmol/L 139  137  141   Potassium 3.5 - 5.1 mmol/L 3.5  3.7  3.9   Chloride 98 - 111 mmol/L 109  106  105   CO2 22 - 32 mmol/L 22  24  25    Calcium 8.9 - 10.3 mg/dL 8.7  8.5  9.2   Total Protein 6.5 - 8.1 g/dL  6.1  6.8   Total Bilirubin <1.2 mg/dL  1.4  1.4   Alkaline Phos 38 - 126 U/L  12  20   AST 15 - 41 U/L  27  30   ALT 0 - 44 U/L  20  24      Imaging studies:   KUB (06/09/2023) personally reviewed with air throughout colon and rectum, and radiologist report pending...   Assessment/Plan: (ICD-10's: K68.609) 77 y.o. male with small bowel obstruction likely secondary to post-surgical adhesive disease vs narrowing of previous small  bowel anastomosis, complicated by pertinent comorbidities including Hx of CABG on anticoagulation, TIA, PAF.   - I think we can trial CLD this morning. Potentially FLD this PM if does well. - No need for NGT - No need for emergent surgical intervention. He understands that should he fail to improve or worsen in any fashion, we would need to consider this more urgently.  - Monitor abdominal examination; on-going bowel function - Given KUB improvement and clinical improvement; I think we can hold off on gastrografin challenge right now             - Pain control prn; antiemetics prn              - Mobilize as tolerated              - Further management per primary service; we will follow   All of the above findings and recommendations were discussed with the patient, patient's family (wife at  bedside), and the medical team, and all of patient's and family's questions were answered to their expressed satisfaction.  -- Lynden Oxford, PA-C [facility] 06/09/2023, 7:26 AM M-F: 7am - 4pm

## 2023-06-09 NOTE — Progress Notes (Signed)
Progress Note   Patient: Ruben [name]:[id number] DOB: 1946-03-21 DOA: 06/07/2023     2 DOS: the patient was seen and examined on 06/09/2023   Brief hospital course: From HPI "Ruben [name] is a 77 y.o. male with medical history significant of coronary artery disease status post CABG, atrial fibrillation on Eliquis, TIA, HFpEF, history of SBO presenting with small bowel obstruction.  Patient reports progressively worsening abdominal pain over the past 12 to 24 hours.  # Onset of moderate to severe abdominal pain around 1 AM.  Patient reports partially eating his food last night. No fevers or chills.  Patient reports remote history of Meckel's diverticulum with partial colectomy and appendectomy in 1998.  Patient reports episode of small bowel obstruction around 2004.  Has been otherwise stable.  No reported change in diet apart from partially eating food last night.  Baseline cardiac disease followed by [facility].  Had CABG done in March of this year.  No recent change in medication.  No active chest pain. Presented to the ER afebrile, hemodynamically stable.  Satting well on room air.  White count 6, hemoglobin 12, platelets 190, lipase 55, creatinine 1.3, urinalysis not indicative of infection.  CT abdomen pelvis with dilated small bowel essentially with stranding with findings concerning for developing partial bowel obstruction.  "   Assessment and Plan: SBO (small bowel obstruction) (HCC) CT scan of the abdomen showed findings of bowel obstruction General Surgery on board and case discussed Patient received supplemental IV fluid Patient initiated on clear liquid diet today  by surgery team Continue antiemetics Pain control Serial abdominal imaging by surgery team   (HFpEF) heart failure with preserved ejection fraction (HCC) 2D echo March 2024 with EF of 55 to 60% Fully compensated Monitor input and output closely   S/P CABG x 3 NSTEMI 10/02/2022 with cardiac  catheterization revealing  75% stenosis ostial left main, 100% stenosis distal left main/ostial LAD, 90% stenosis ostial RCA s/p CABG x 3 at [facility] 10/05/2022  No active chest pain Continue aspirin, metoprolol, Crestor   Atrial fibrillation, chronic (HCC) Rate controlled at present Continue amiodarone and Eliquis    Hyperlipidemia Continue statin therapy   TIA (transient ischemic attack) Continue statin and aspirin therapy    Advance Care Planning:   Code Status: Prior    Consults: General Surgery    Family Communication: No family at the bedside      Subjective:  Admits to improvement in abdominal pain and denies any vomiting today He did have some nausea yesterday but none today   Physical Exam: Appearance: He is normal weight.  HENT:     Head: Normocephalic and atraumatic.     Nose: Nose normal.     Mouth/Throat:     Mouth: Mucous membranes are moist.  Eyes:     Pupils: Pupils are equal, round, and reactive to light.  Cardiovascular:     Rate and Rhythm: Normal rate and regular rhythm.  Pulmonary:     Effort: Pulmonary effort is normal.  Abdominal: Mild tenderness around umbilicus area to the right Musculoskeletal:        General: Normal range of motion.  Skin:    General: Skin is warm.  Neurological:     General: No focal deficit present.  Psychiatric:      Data Reviewed:    Latest Ref Rng & Units 06/09/2023    4:41 AM 06/08/2023    4:18 AM 06/07/2023    8:42 AM  BMP  Glucose 70 - 99 mg/dL 89  323  557   BUN 8 - 23 mg/dL 17  20  20    Creatinine 0.61 - 1.24 mg/dL 3.22  0.25  4.27   Sodium 135 - 145 mmol/L 139  137  141   Potassium 3.5 - 5.1 mmol/L 3.5  3.7  3.9   Chloride 98 - 111 mmol/L 109  106  105   CO2 22 - 32 mmol/L 22  24  25    Calcium 8.9 - 10.3 mg/dL 8.7  8.5  9.2        Latest Ref Rng & Units 06/09/2023    4:41 AM 06/08/2023    4:18 AM 06/07/2023    8:42 AM  CBC  WBC 4.0 - 10.5 K/uL 3.6  4.4  6.0   Hemoglobin 13.0 - 17.0 g/dL 06.2  37.6   28.3   Hematocrit 39.0 - 52.0 % 32.0  35.2  36.9   Platelets 150 - 400 K/uL 162  172  190     CT scan of the abdomen and pelvics reviewed showing dilated small loops of bowel concerning for small bowel obstruction  Disposition: Status is: Inpatient   Time spent: 48 minutes    Vitals:   06/08/23 1959 06/09/23 0412 06/09/23 0746 06/09/23 1519  BP: 126/61 (!) 125/47 (!) 133/59 (!) 149/70  Pulse: 61 68 70 (!) 57  Resp: 18 18 16 17   Temp: 98.4 F (36.9 C) 98.9 F (37.2 C) 98.4 F (36.9 C) 98.2 F (36.8 C)  TempSrc: Oral Oral Oral Oral  SpO2: 98% 98% 95% 100%  Weight:      Height:         Author: Loyce Dys, MD 06/09/2023 7:30 PM  For on call review [email or web address].

## 2023-06-10 DIAGNOSIS — K56609 Unspecified intestinal obstruction, unspecified as to partial versus complete obstruction: Secondary | ICD-10-CM | POA: Diagnosis not present

## 2023-06-10 LAB — BASIC METABOLIC PANEL
Anion gap: 9 (ref 5–15)
BUN: 16 mg/dL (ref 8–23)
CO2: 23 mmol/L (ref 22–32)
Calcium: 8.5 mg/dL — ABNORMAL LOW (ref 8.9–10.3)
Chloride: 108 mmol/L (ref 98–111)
Creatinine, Ser: 1.28 mg/dL — ABNORMAL HIGH (ref 0.61–1.24)
GFR, Estimated: 58 mL/min — ABNORMAL LOW (ref >60)
Glucose, Bld: 84 mg/dL (ref 70–99)
Potassium: 3.5 mmol/L (ref 3.5–5.1)
Sodium: 140 mmol/L (ref 135–145)

## 2023-06-10 LAB — CBC WITH DIFFERENTIAL/PLATELET
Abs Immature Granulocytes: 0.01 10*3/uL (ref 0.00–0.07)
Basophils Absolute: 0 10*3/uL (ref 0.0–0.1)
Basophils Relative: 0 %
Eosinophils Absolute: 0 10*3/uL (ref 0.0–0.5)
Eosinophils Relative: 2 %
HCT: 30.8 % — ABNORMAL LOW (ref 39.0–52.0)
Hemoglobin: 10.5 g/dL — ABNORMAL LOW (ref 13.0–17.0)
Immature Granulocytes: 0 %
Lymphocytes Relative: 38 %
Lymphs Abs: 1 10*3/uL (ref 0.7–4.0)
MCH: 33.1 pg (ref 26.0–34.0)
MCHC: 34.1 g/dL (ref 30.0–36.0)
MCV: 97.2 fL (ref 80.0–100.0)
Monocytes Absolute: 0.5 10*3/uL (ref 0.1–1.0)
Monocytes Relative: 21 %
Neutro Abs: 1 10*3/uL — ABNORMAL LOW (ref 1.7–7.7)
Neutrophils Relative %: 39 %
Platelets: 160 10*3/uL (ref 150–400)
RBC: 3.17 MIL/uL — ABNORMAL LOW (ref 4.22–5.81)
RDW: 13 % (ref 11.5–15.5)
WBC: 2.5 10*3/uL — ABNORMAL LOW (ref 4.0–10.5)
nRBC: 0 % (ref 0.0–0.2)

## 2023-06-10 MED ORDER — DIPHENHYDRAMINE HCL 12.5 MG/5ML PO ELIX
12.5000 mg | ORAL_SOLUTION | Freq: Once | ORAL | Status: AC
  Administered 2023-06-10: 12.5 mg via ORAL
  Filled 2023-06-10: qty 5

## 2023-06-10 MED ORDER — APIXABAN 5 MG PO TABS
5.0000 mg | ORAL_TABLET | Freq: Two times a day (BID) | ORAL | Status: DC
  Administered 2023-06-10 – 2023-06-11 (×2): 5 mg via ORAL
  Filled 2023-06-10 (×2): qty 1

## 2023-06-10 NOTE — Progress Notes (Signed)
[facility]  - [place] [facility]   PATIENT NAME: Ruben [name]    MR#:  [id number]  DATE OF BIRTH:  1946/03/28  SUBJECTIVE:  patient laying in bed. Complained of some abdominal pain. Tells me had not slept last night wanted to rest. No family at bedside.    VITALS:  Blood pressure 124/65, pulse (!) 51, temperature 97.9 F (36.6 C), temperature source Oral, resp. rate 16, height 5\' 8"  (1.727 m), weight 61.2 kg, SpO2 99%.  PHYSICAL EXAMINATION:   GENERAL:  77 y.o.-year-old patient with no acute distress.  LUNGS: Normal breath sounds bilaterally, no wheezing CARDIOVASCULAR: S1, S2 normal. No murmur   ABDOMEN: Soft, tender, nondistended.   EXTREMITIES: No  edema b/l.    NEUROLOGIC: nonfocal  patient is alert and awake  LABORATORY PANEL:  CBC Recent Labs  Lab 06/10/23 0435  WBC 2.5*  HGB 10.5*  HCT 30.8*  PLT 160    Chemistries  Recent Labs  Lab 06/08/23 0418 06/09/23 0441 06/10/23 0435  NA 137 139 140  K 3.7 3.5 3.5  CL 106 109 108  CO2 24 22 23   GLUCOSE 152* 89 84  BUN 20 17 16   CREATININE 1.25* 1.25* 1.28*  CALCIUM 8.5* 8.7* 8.5*  MG  --  2.2  --   AST 27  --   --   ALT 20  --   --   ALKPHOS 12*  --   --   BILITOT 1.4*  --   --    Cardiac Enzymes No results for input(s): "TROPONINI" in the last 168 hours. RADIOLOGY:  DG ABD ACUTE 2+V W 1V CHEST  Result Date: 06/09/2023 CLINICAL DATA:  Small-bowel obstruction EXAM: DG ABDOMEN ACUTE WITH 1 VIEW CHEST COMPARISON:  11/04/2022. x-ray and older. CT abdomen pelvis 06/07/2023. FINDINGS: Hyperinflation. Postop chest with sternal wires and atrial occlusion clip. Tiny left effusion. Normal cardiopericardial silhouette. No consolidation or pneumothorax. No edema. Scattered colonic stool. Gas seen in loops of nondilated colon. Air in the rectum. There are some air-filled dilated loops of small bowel in the midabdomen with a few air-fluid levels on the upright view. Surgical changes in the  midabdomen. Large bowel small bowel loops are less dilated compared to the prior CT today. No definite free air seen beneath the diaphragm. Gallstones in the right upper quadrant IMPRESSION: Postop chest.  Tiny left effusion. Decreasing small bowel dilatation midabdomen with some residual. Again air along the colon and rectum which is nondilated. Surgical changes. Gallstones Electronically Signed   By: Karen Kays M.D.   On: 06/09/2023 11:45    Assessment and Plan Ruben [name] is a 77 y.o. male with medical history significant of coronary artery disease status post CABG, atrial fibrillation on Eliquis, TIA, HFpEF, history of SBO presenting with small bowel obstruction.  Patient reports progressively worsening abdominal pain over the past 12 to 24 hours.   Patient reports remote history of Meckel's diverticulum with partial colectomy and appendectomy in 1998. Patient reports episode of small bowel obstruction around 2004.   CT abdomen pelvis with dilated small bowel essentially with stranding with findings concerning for developing partial bowel obstruction.    SBO (small bowel obstruction) (HCC) --CT scan of the abdomen showed findings of bowel obstruction --General Surgery Dr Everlene Farrier recommends conservative management for now -- IV fluid --Patient initiated on clear liquid diet ---now on FLD --Continue antiemetics ---Pain control --Serial abdominal imaging by surgery team   (HFpEF) heart failure with preserved ejection fraction (  HCC) --2D echo March 2024 with EF of 55 to 60% -- compensated   H/o CAD S/P CABG x 3 --NSTEMI 10/02/2022 with cardiac catheterization revealing  75% stenosis ostial left main, 100% stenosis distal left main/ostial LAD, 90% stenosis ostial RCA s/p CABG x 3 at [facility] 10/05/2022  --Continue aspirin, metoprolol, Crestor   Atrial fibrillation, chronic (HCC) --Rate controlled at present --Continue amiodarone and Eliquis   Hyperlipidemia -Continue statin therapy    TIA (transient ischemic attack) --Continue statin and aspirin therapy    Advance Care Planning: Full code  Consults: General Surgery   Family Communication: No family at the bedside  Level of care: Telemetry Medical Status is: Inpatient Remains inpatient appropriate because: SBO    TOTAL TIME TAKING CARE OF THIS PATIENT: 35 minutes.  >50% time spent on counselling and coordination of care  Note: This dictation was prepared with Dragon dictation along with smaller phrase technology. Any transcriptional errors that result from this process are unintentional.  Enedina Finner M.D    [facility]   CC: Primary care physician; Kandyce Rud, MD

## 2023-06-10 NOTE — Care Management Important Message (Signed)
Important Message  Patient Details  Name: Ruben [name] MRN: [id number] Date of Birth: 1945-12-29   Important Message Given:  N/A - LOS <3 / Initial given by admissions     Olegario Messier A Korrin Waterfield 06/10/2023, 8:22 AM

## 2023-06-10 NOTE — Plan of Care (Signed)
Tolerating FLD , no BM yet. Ambulates in hall several times a day. Dc pending diet advancement.  Problem: Education: Goal: Knowledge of General Education information will improve Description: Including pain rating scale, medication(s)/side effects and non-pharmacologic comfort measures Outcome: Progressing   Problem: Health Behavior/Discharge Planning: Goal: Ability to manage health-related needs will improve Outcome: Progressing   Problem: Clinical Measurements: Goal: Ability to maintain clinical measurements within normal limits will improve Outcome: Progressing Goal: Will remain free from infection Outcome: Progressing Goal: Diagnostic test results will improve Outcome: Progressing Goal: Respiratory complications will improve Outcome: Progressing Goal: Cardiovascular complication will be avoided Outcome: Progressing   Problem: Activity: Goal: Risk for activity intolerance will decrease Outcome: Progressing   Problem: Nutrition: Goal: Adequate nutrition will be maintained Outcome: Progressing   Problem: Coping: Goal: Level of anxiety will decrease Outcome: Progressing   Problem: Elimination: Goal: Will not experience complications related to bowel motility Outcome: Progressing Goal: Will not experience complications related to urinary retention Outcome: Progressing   Problem: Pain Management: Goal: General experience of comfort will improve Outcome: Progressing   Problem: Safety: Goal: Ability to remain free from injury will improve Outcome: Progressing   Problem: Skin Integrity: Goal: Risk for impaired skin integrity will decrease Outcome: Progressing

## 2023-06-10 NOTE — Progress Notes (Signed)
[facility] SURGICAL PROGRESS NOTE (cpt [phone])  Hospital Day(s): 3.   Interval History: Patient seen and examined, no acute events or new complaints overnight. Patient reports he is doing well this morning; more frustrated with lack of sleep. Abdominal pain and distension improved. No fever, chills, nausea, emesis. Labs this morning show very mild leukopenia to 2.5K. Hgb to 10.5; suspect dilutional. Renal function is baseline; sCr - 1.28; UO - 350 ccs + unmeasured. No electrolyte derangements. He is on CLD; tolerating. Continues to have flatus   Review of Systems:  Constitutional: denies fever, chills  HEENT: denies cough or congestion  Respiratory: denies any shortness of breath  Cardiovascular: denies chest pain or palpitations  Gastrointestinal: denies abdominal pain, N/V Genitourinary: denies burning with urination or urinary frequency Musculoskeletal: denies pain, decreased motor or sensation  Vital signs in last 24 hours: [min-max] current  Temp:  [98.2 F (36.8 C)-98.4 F (36.9 C)] 98.4 F (36.9 C) (11/20 2016) Pulse Rate:  [57-70] 59 (11/20 2016) Resp:  [16-18] 18 (11/20 2016) BP: (133-149)/(59-70) 133/64 (11/20 2016) SpO2:  [95 %-100 %] 100 % (11/20 2016)     Height: 5\' 8"  (172.7 cm) Weight: 61.2 kg BMI (Calculated): 20.53   Intake/Output last 2 shifts:  No intake/output data recorded.   Physical Exam:  Constitutional: alert, cooperative and no distress  HENT: normocephalic without obvious abnormality  Eyes: PERRL, EOM's grossly intact and symmetric  Respiratory: breathing non-labored at rest  Cardiovascular: regular rate and sinus rhythm  Gastrointestinal: soft, previous distension appears resolved, no significant tenderness, no rebound/guarding. He is certainly not peritonitic Musculoskeletal: no edema or wounds, motor and sensation grossly intact, NT    Labs:     Latest Ref Rng & Units 06/10/2023    4:35 AM 06/09/2023    4:41 AM 06/08/2023     4:18 AM  CBC  WBC 4.0 - 10.5 K/uL 2.5  3.6  4.4   Hemoglobin 13.0 - 17.0 g/dL 97.5  88.3  25.4   Hematocrit 39.0 - 52.0 % 30.8  32.0  35.2   Platelets 150 - 400 K/uL 160  162  172       Latest Ref Rng & Units 06/10/2023    4:35 AM 06/09/2023    4:41 AM 06/08/2023    4:18 AM  CMP  Glucose 70 - 99 mg/dL 84  89  982   BUN 8 - 23 mg/dL 16  17  20    Creatinine 0.61 - 1.24 mg/dL 6.41  5.83  0.94   Sodium 135 - 145 mmol/L 140  139  137   Potassium 3.5 - 5.1 mmol/L 3.5  3.5  3.7   Chloride 98 - 111 mmol/L 108  109  106   CO2 22 - 32 mmol/L 23  22  24    Calcium 8.9 - 10.3 mg/dL 8.5  8.7  8.5   Total Protein 6.5 - 8.1 g/dL   6.1   Total Bilirubin <1.2 mg/dL   1.4   Alkaline Phos 38 - 126 U/L   12   AST 15 - 41 U/L   27   ALT 0 - 44 U/L   20      Imaging studies:  No new imaging studies    Assessment/Plan: (ICD-10's: K64.609) 77 y.o. male with small bowel obstruction likely secondary to post-surgical adhesive disease vs narrowing of previous small bowel anastomosis, complicated by pertinent comorbidities including Hx of CABG on anticoagulation, TIA, PAF.   - We will advance to  FLD. If he has any bowel movements today, he can have soft diet - No need for NGT - No need for emergent surgical intervention.  - Monitor abdominal examination; on-going bowel function             - Pain control prn; antiemetics prn              - Mobilize as tolerated              - Further management per primary service; we will follow    - Discharge Planning: Doing well with ROBF. Pending progression of diet may be able to DC home tomorrow (11/22).   All of the above findings and recommendations were discussed with the patient, patient's family (wife at bedside), and the medical team, and all of patient's and family's questions were answered to their expressed satisfaction.  -- Lynden Oxford, PA-C [facility] 06/10/2023, 7:08 AM M-F: 7am - 4pm

## 2023-06-11 DIAGNOSIS — K56609 Unspecified intestinal obstruction, unspecified as to partial versus complete obstruction: Secondary | ICD-10-CM | POA: Diagnosis not present

## 2023-06-11 MED ORDER — OXYCODONE HCL 5 MG PO TABS: 5.0000 mg | ORAL_TABLET | Freq: Four times a day (QID) | ORAL | Status: DC | PRN

## 2023-06-11 MED ORDER — VITAMIN D 25 MCG (1000 UNIT) PO TABS
1000.0000 [IU] | ORAL_TABLET | Freq: Every day | ORAL | Status: DC
  Administered 2023-06-11: 1000 [IU] via ORAL
  Filled 2023-06-11: qty 1

## 2023-06-11 MED ORDER — VITAMIN B-12 1000 MCG PO TABS
1000.0000 ug | ORAL_TABLET | Freq: Every day | ORAL | Status: DC
  Administered 2023-06-11: 1000 ug via ORAL
  Filled 2023-06-11: qty 1

## 2023-06-11 MED ORDER — SENNOSIDES-DOCUSATE SODIUM 8.6-50 MG PO TABS
1.0000 | ORAL_TABLET | Freq: Two times a day (BID) | ORAL | Status: DC
  Filled 2023-06-11: qty 1

## 2023-06-11 MED ORDER — POLYETHYLENE GLYCOL 3350 17 G PO PACK
17.0000 g | PACK | Freq: Every day | ORAL | Status: DC
  Filled 2023-06-11: qty 1

## 2023-06-11 MED ORDER — OXYCODONE HCL 5 MG PO TABS: 5.0000 mg | ORAL_TABLET | Freq: Three times a day (TID) | ORAL | 0 refills | Status: AC | PRN

## 2023-06-11 MED ORDER — SENNOSIDES-DOCUSATE SODIUM 8.6-50 MG PO TABS: 1.0000 | ORAL_TABLET | Freq: Two times a day (BID) | ORAL | 0 refills | Status: AC

## 2023-06-11 MED ORDER — VITAMIN C 500 MG PO TABS
250.0000 mg | ORAL_TABLET | Freq: Every day | ORAL | Status: DC
  Administered 2023-06-11: 250 mg via ORAL
  Filled 2023-06-11: qty 1

## 2023-06-11 NOTE — Consult Note (Signed)
[facility] Liaison Note  06/11/2023  Ruben [name] 1946-01-01 [id number]  Location: RN Hospital Liaison screened the patient remotely at [facility].  Insurance: [employer]   Ruben [name] is a 77 y.o. male who is a Primary Care Patient of Kandyce Rud, MD at [facility]. The patient was screened for  readmission hospitalization with noted medium risk score for unplanned readmission risk with 1 IP in 6 months.  The patient was assessed for potential Care Management service needs for post hospital transition for care coordination. Review of patient's electronic medical record reveals patient was admitted for Small bowel obstruction. Pt discharged home with self care. No anticipated needs to address at this time.  [facility] Care Management/Population Health does not replace or interfere with any arrangements made by the Inpatient Transition of Care team.   For questions contact:   Elliot Cousin, RN, [facility] Liaison [place]   [facility], Population Health Office Hours [email or web address]  8:00 am-6:00 pm Direct Dial: [phone] mobile [phone] [Office toll free line] Office Hours are M-F 8:30 - 5 pm [name]

## 2023-06-11 NOTE — Discharge Summary (Signed)
Physician Discharge Summary   Patient: Ruben [name] MRN: [id number] DOB: 12/19/1945  Admit date:     06/07/2023  Discharge date: 06/11/23  Discharge Physician: Enedina Finner   PCP: Kandyce Rud, MD   Recommendations at discharge:    F/u Dr Randel Books or Joanna Hews in 1-3 weeks F/u PCP in 1-2 weeks  Discharge Diagnoses: Principal Problem:   SBO (small bowel obstruction) (HCC) Active Problems:   TIA (transient ischemic attack)   Hyperlipidemia   Atrial fibrillation, chronic (HCC)   S/P CABG x 3   (HFpEF) heart failure with preserved ejection fraction (HCC)   Ruben [name] is a 77 y.o. male with medical history significant of coronary artery disease status post CABG, atrial fibrillation on Eliquis, TIA, HFpEF, history of SBO presenting with small bowel obstruction.  Patient reports progressively worsening abdominal pain over the past 12 to 24 hours.    Patient reports remote history of Meckel's diverticulum with partial colectomy and appendectomy in 1998. Patient reports episode of small bowel obstruction around 2004.    CT abdomen pelvis with dilated small bowel essentially with stranding with findings concerning for developing partial bowel obstruction.      SBO (small bowel obstruction) (HCC) --CT scan of the abdomen showed findings of bowel obstruction --General Surgery Dr Everlene Farrier recommends conservative management for now -- received IV fluid --Patient initiated on clear liquid diet ---now on FLD --Continue antiemetics ---Pain control -- patient's dad what is advanced by surgery. He did have a bowel movement per RN. Patient is feeling better and agreeable to go home.   (HFpEF) heart failure with preserved ejection fraction [facility]) --2D echo March 2024 with EF of 55 to 60% -- compensated   H/o CAD S/P CABG x 3 --NSTEMI 10/02/2022 with cardiac catheterization revealing  75% stenosis ostial left main, 100% stenosis distal left main/ostial LAD, 90% stenosis ostial  RCA s/p CABG x 3 at [facility] 10/05/2022  --Continue aspirin, metoprolol, Crestor   Atrial fibrillation, chronic (HCC) --Rate controlled at present --Continue amiodarone and Eliquis   Hyperlipidemia -Continue statin therapy   TIA (transient ischemic attack) --Continue statin and aspirin therapy    Advance Care Planning: Full code  Consults: General Surgery   Family Communication: No family at the bedside      Pain control - [place] Controlled Substance Reporting System database was reviewed. and patient was instructed, not to drive, operate heavy machinery, perform activities at heights, swimming or participation in water activities or provide baby-sitting services while on Pain, Sleep and Anxiety Medications; until their outpatient Physician has advised to do so again. Also recommended to not to take more than prescribed Pain, Sleep and Anxiety Medications.   Diet recommendation:  Discharge Diet Orders (From admission, onward)     Start     Ordered   06/11/23 0000  Diet - low sodium heart healthy        06/11/23 1421            DISCHARGE MEDICATION: Allergies as of 06/11/2023       Reactions   Codeine Nausea Only   Naproxen Sodium Other (See Comments)   Bleeding ulcers        Medication List     TAKE these medications    amiodarone 200 MG tablet Commonly known as: PACERONE Take 1 tablet (200 mg total) by mouth 2 (two) times daily. For 7 days then reduce the dose to 1 tablet (200 mg) twice daily.   apixaban 5 MG Tabs  tablet Commonly known as: ELIQUIS Take 1 tablet (5 mg total) by mouth 2 (two) times daily.   aspirin EC 81 MG tablet Take 1 tablet (81 mg total) by mouth daily. Swallow whole.   cholecalciferol 25 MCG (1000 UNIT) tablet Commonly known as: VITAMIN D3 Take 1,000 Units by mouth daily.   cyanocobalamin 1000 MCG tablet Commonly known as: VITAMIN B12 Take 1,000 mcg by mouth daily.   metoprolol succinate 25 MG 24 hr tablet Commonly known as:  TOPROL-XL Take 1 tablet (25 mg total) by mouth daily.   oxyCODONE 5 MG immediate release tablet Commonly known as: Oxy IR/ROXICODONE Take 1 tablet (5 mg total) by mouth every 8 (eight) hours as needed for severe pain (pain score 7-10), breakthrough pain or moderate pain (pain score 4-6).   REFRESH OP Place 1 drop into both eyes as needed (dry eye).   rosuvastatin 20 MG tablet Commonly known as: CRESTOR Take 2 tablets (40 mg total) by mouth at bedtime.   senna-docusate 8.6-50 MG tablet Commonly known as: Senokot-S Take 1 tablet by mouth 2 (two) times daily.   vitamin C 250 MG tablet Commonly known as: ASCORBIC ACID Take 250 mg by mouth daily.        Follow-up Information     [facility] Follow up.   Specialty: General Surgery Why: As needed Contact information: [address] [place] [address] [phone]        Kandyce Rud, MD. Schedule an appointment as soon as possible for a visit in 1 week(s).   Specialty: Family Medicine Contact information: [address] [facility] [place] [address] [phone]                Filed Weights   06/07/23 0834  Weight: 61.2 kg     Condition at discharge: fair  The results of significant diagnostics from this hospitalization (including imaging, microbiology, ancillary and laboratory) are listed below for reference.   Imaging Studies: DG ABD ACUTE 2+V W 1V CHEST  Result Date: 06/09/2023 CLINICAL DATA:  Small-bowel obstruction EXAM: DG ABDOMEN ACUTE WITH 1 VIEW CHEST COMPARISON:  11/04/2022. x-ray and older. CT abdomen pelvis 06/07/2023. FINDINGS: Hyperinflation. Postop chest with sternal wires and atrial occlusion clip. Tiny left effusion. Normal cardiopericardial silhouette. No consolidation or pneumothorax. No edema. Scattered colonic stool. Gas seen in loops of nondilated colon. Air in the  rectum. There are some air-filled dilated loops of small bowel in the midabdomen with a few air-fluid levels on the upright view. Surgical changes in the midabdomen. Large bowel small bowel loops are less dilated compared to the prior CT today. No definite free air seen beneath the diaphragm. Gallstones in the right upper quadrant IMPRESSION: Postop chest.  Tiny left effusion. Decreasing small bowel dilatation midabdomen with some residual. Again air along the colon and rectum which is nondilated. Surgical changes. Gallstones Electronically Signed   By: Karen Kays M.D.   On: 06/09/2023 11:45   CT ABDOMEN PELVIS W CONTRAST  Result Date: 06/07/2023 CLINICAL DATA:  Periumbilical and right lower quadrant abdominal pain and tenderness for the last 12 hours. History of a resection of a Meckel's diverticulum. EXAM: CT ABDOMEN AND PELVIS WITH CONTRAST TECHNIQUE: Multidetector CT imaging of the abdomen and pelvis was performed using the standard protocol following bolus administration of intravenous contrast. RADIATION DOSE REDUCTION: This exam was performed according to the departmental dose-optimization program which includes automated exposure control,  adjustment of the mA and/or kV according to patient size and/or use of iterative reconstruction technique. CONTRAST:  OMNIPAQUE IOHEXOL 300 MG/ML  SOLN COMPARISON:  CT 2005 December. FINDINGS: Lower chest: Is some linear opacity lung bases likely scar or atelectasis. No pleural effusion. Calcified nodule in the right lung base consistent with old granulomatous disease, unchanged. Prominent coronary artery calcifications are seen. Surgical changes. Hepatobiliary: Enhancing focus in segment 7 of the liver today measuring 16 mm. Of interest going back 2005 there is a small lesion in the same location. At that time this measured 5 mm. Again felt to be a atypical flash fill hemangioma. Smaller focus in segment 2. Patent portal vein. Tiny low-attenuation lesion seen  in segment 4 of the dome on series 3, image 20. Too small to completely characterize but likely a benign cystic lesion and no specific follow-up. Stone in the nondilated gallbladder. Pancreas: Unremarkable. No pancreatic ductal dilatation or surrounding inflammatory changes. Spleen: Normal in size without focal abnormality. Adrenals/Urinary Tract: Adrenal glands are preserved. Mild right renal atrophy. Nonobstructing lower pole left-sided renal stones. More focal atrophy of the lower pole left kidney. There also some tiny low-attenuation lesions along each kidney which are too small to completely characterize but favor benign cystic areas. Bosniak 1 and 2. No specific imaging follow-up. There is slight ectasia of the right renal collecting system but no delayed enhancement or excretion. Caliber change of the ureter at the level of the upper pelvis and common iliac vessel. Preserved contours of the urinary bladder. Stomach/Bowel: On this non oral contrast exam, large bowel has a normal course and caliber. Scattered colonic stool left-sided colonic diverticula more than right. Surgical changes in the area of the base of the cecum. Please correlate with the prior appendectomy. Stomach is nondilated. Fundal posterior duodenal diverticulum. There are several fluid-filled loops of dilated small bowel in the midabdomen. Adjacent mild stranding and fluid. There is 1 particular loop in the central pelvis which is more dilated at the margin of the suture line measuring up to 6.3 cm with small bowel stool appearance in this location and associated caliber change. The small bowel distal to this area is decompressed. Findings are consistent with a developing or partial obstruction. No pneumatosis, free air or portal venous gas. Vascular/Lymphatic: Aortic atherosclerosis. No enlarged abdominal or pelvic lymph nodes. Reproductive: Prostate is unremarkable. Other: Tiny fat containing umbilical hernia. Musculoskeletal: No acute or  significant osseous findings. IMPRESSION: Dilated small bowel centrally with some stranding. Transition in the central pelvis at the suture line. The findings are consistent with a developing or partial obstruction. No pneumatosis, free air or rim enhancing fluid collection. Minimal ectasia of the right renal collecting system, nonspecific. No delayed enhancement or excretion. Nonobstructing left-sided renal stones. Hepatic hemangiomas. Gallstones. Electronically Signed   By: Karen Kays M.D.   On: 06/07/2023 14:04    Microbiology: Results for orders placed or performed during the hospital encounter of 10/03/22  MRSA Next Gen by PCR, Nasal     Status: None   Collection Time: 10/03/22  9:13 AM   Specimen: Nasal Mucosa; Nasal Swab  Result Value Ref Range Status   MRSA by PCR Next Gen NOT DETECTED NOT DETECTED Final    Comment: (NOTE) The GeneXpert MRSA Assay (FDA approved for NASAL specimens only), is one component of a comprehensive MRSA colonization surveillance program. It is not intended to diagnose MRSA infection nor to guide or monitor treatment for MRSA infections. Test performance is not FDA approved  in patients less than 55 years old. Performed at [facility], [address] [place], [place] [address]   SARS Coronavirus 2 by RT PCR (hospital order, performed in [facility] hospital lab) *cepheid single result test* Anterior Nasal Swab     Status: None   Collection Time: 10/04/22  9:27 PM   Specimen: Anterior Nasal Swab  Result Value Ref Range Status   SARS Coronavirus 2 by RT PCR NEGATIVE NEGATIVE Final    Comment: Performed at [facility], [address], [place], [place] [address]    Labs: CBC: Recent Labs  Lab 06/07/23 [phone] 06/08/23 0418 06/09/23 0441 06/10/23 0435  WBC 6.0 4.4 3.6* 2.5*  NEUTROABS  --   --   --  1.0*  HGB 12.0* 11.8* 10.7* 10.5*  HCT 36.9* 35.2* 32.0* 30.8*  MCV 101.9* 99.4 98.2 97.2  PLT 190 172 162 160   Basic Metabolic  Panel: Recent Labs  Lab 06/07/23 0842 06/08/23 0418 06/09/23 0441 06/10/23 0435  NA 141 137 139 140  K 3.9 3.7 3.5 3.5  CL 105 106 109 108  CO2 25 24 22 23   GLUCOSE 108* 152* 89 84  BUN 20 20 17 16   CREATININE 1.30* 1.25* 1.25* 1.28*  CALCIUM 9.2 8.5* 8.7* 8.5*  MG  --   --  2.2  --   PHOS  --   --  3.6  --    Liver Function Tests: Recent Labs  Lab 06/07/23 0842 06/08/23 0418  AST 30 27  ALT 24 20  ALKPHOS 20* 12*  BILITOT 1.4* 1.4*  PROT 6.8 6.1*  ALBUMIN 3.9 3.5    Discharge time spent: greater than 30 minutes.  Signed: Enedina Finner, MD [facility] 06/11/2023

## 2023-06-11 NOTE — Progress Notes (Signed)
[facility] SURGICAL PROGRESS NOTE (cpt [id number])  Hospital Day(s): 4.   Interval History: Patient seen and examined, no acute events or new complaints overnight. Patient reports he is doing well. Sitting up in chair this morning. No fever, chills, nausea, emesis. No new labs nor imaging this morning. He has been on FLD; tolerating. Passing lots of flatus. He is hungry.   Review of Systems:  Constitutional: denies fever, chills  HEENT: denies cough or congestion  Respiratory: denies any shortness of breath  Cardiovascular: denies chest pain or palpitations  Gastrointestinal: denies abdominal pain, N/V Genitourinary: denies burning with urination or urinary frequency Musculoskeletal: denies pain, decreased motor or sensation  Vital signs in last 24 hours: [min-max] current  Temp:  [97.9 F (36.6 C)-98.6 F (37 C)] 98.6 F (37 C) (11/22 0231) Pulse Rate:  [47-60] 58 (11/22 0231) Resp:  [16-18] 16 (11/22 0231) BP: (105-144)/(45-65) 125/56 (11/22 0321) SpO2:  [98 %-99 %] 98 % (11/22 0231)     Height: 5\' 8"  (172.7 cm) Weight: 61.2 kg BMI (Calculated): 20.53   Intake/Output last 2 shifts:  No intake/output data recorded.   Physical Exam:  Constitutional: alert, cooperative and no distress  HENT: normocephalic without obvious abnormality  Eyes: PERRL, EOM's grossly intact and symmetric  Respiratory: breathing non-labored at rest  Cardiovascular: regular rate and sinus rhythm  Gastrointestinal: soft, previous distension appears resolved, no significant tenderness, no rebound/guarding. He is certainly not peritonitic Musculoskeletal: no edema or wounds, motor and sensation grossly intact, NT    Labs:     Latest Ref Rng & Units 06/10/2023    4:35 AM 06/09/2023    4:41 AM 06/08/2023    4:18 AM  CBC  WBC 4.0 - 10.5 K/uL 2.5  3.6  4.4   Hemoglobin 13.0 - 17.0 g/dL 91.4  78.2  95.6   Hematocrit 39.0 - 52.0 % 30.8  32.0  35.2   Platelets 150 - 400 K/uL 160  162  172        Latest Ref Rng & Units 06/10/2023    4:35 AM 06/09/2023    4:41 AM 06/08/2023    4:18 AM  CMP  Glucose 70 - 99 mg/dL 84  89  213   BUN 8 - 23 mg/dL 16  17  20    Creatinine 0.61 - 1.24 mg/dL 0.86  5.78  4.69   Sodium 135 - 145 mmol/L 140  139  137   Potassium 3.5 - 5.1 mmol/L 3.5  3.5  3.7   Chloride 98 - 111 mmol/L 108  109  106   CO2 22 - 32 mmol/L 23  22  24    Calcium 8.9 - 10.3 mg/dL 8.5  8.7  8.5   Total Protein 6.5 - 8.1 g/dL   6.1   Total Bilirubin <1.2 mg/dL   1.4   Alkaline Phos 38 - 126 U/L   12   AST 15 - 41 U/L   27   ALT 0 - 44 U/L   20      Imaging studies:  No new imaging studies    Assessment/Plan: (ICD-10's: K66.609) 77 y.o. male with small bowel obstruction likely secondary to post-surgical adhesive disease vs narrowing of previous small bowel anastomosis, complicated by pertinent comorbidities including Hx of CABG on anticoagulation, TIA, PAF.   - Okay to advance to soft/regular diet  - Monitor abdominal examination; on-going bowel function             - Pain control prn;  antiemetics prn              - Mobilize as tolerated              - Further management per primary service; we will follow    - Discharge Planning: Doing weill with [facility]. I do think it is reasonable to discharge home later today if he tolerates soft diet. He does not need to follow up with [place] but I will leave our contact information. He understands there is always a risk of recurrence and it will be difficult to predict any future recurrences   All of the above findings and recommendations were discussed with the patient, patient's family (wife at bedside), and the medical team, and all of patient's and family's questions were answered to their expressed satisfaction.  -- Lynden Oxford, PA-C [facility] 06/11/2023, 7:11 AM M-F: 7am - 4pm

## 2023-06-11 NOTE — Plan of Care (Signed)

## 2023-06-21 DIAGNOSIS — I480 Paroxysmal atrial fibrillation: Secondary | ICD-10-CM | POA: Diagnosis not present

## 2023-06-21 DIAGNOSIS — D649 Anemia, unspecified: Secondary | ICD-10-CM | POA: Diagnosis not present

## 2023-06-21 DIAGNOSIS — K565 Intestinal adhesions [bands], unspecified as to partial versus complete obstruction: Secondary | ICD-10-CM | POA: Diagnosis not present

## 2023-07-23 ENCOUNTER — Emergency Department: Payer: PPO

## 2023-07-23 ENCOUNTER — Other Ambulatory Visit: Payer: Self-pay

## 2023-07-23 DIAGNOSIS — K56699 Other intestinal obstruction unspecified as to partial versus complete obstruction: Secondary | ICD-10-CM | POA: Diagnosis not present

## 2023-07-23 DIAGNOSIS — R109 Unspecified abdominal pain: Secondary | ICD-10-CM | POA: Diagnosis not present

## 2023-07-23 DIAGNOSIS — Z7901 Long term (current) use of anticoagulants: Secondary | ICD-10-CM | POA: Insufficient documentation

## 2023-07-23 DIAGNOSIS — Z951 Presence of aortocoronary bypass graft: Secondary | ICD-10-CM | POA: Insufficient documentation

## 2023-07-23 DIAGNOSIS — K5730 Diverticulosis of large intestine without perforation or abscess without bleeding: Secondary | ICD-10-CM | POA: Diagnosis not present

## 2023-07-23 DIAGNOSIS — K56609 Unspecified intestinal obstruction, unspecified as to partial versus complete obstruction: Secondary | ICD-10-CM | POA: Diagnosis not present

## 2023-07-23 DIAGNOSIS — Z8673 Personal history of transient ischemic attack (TIA), and cerebral infarction without residual deficits: Secondary | ICD-10-CM | POA: Diagnosis not present

## 2023-07-23 DIAGNOSIS — Z7982 Long term (current) use of aspirin: Secondary | ICD-10-CM | POA: Insufficient documentation

## 2023-07-23 DIAGNOSIS — N200 Calculus of kidney: Secondary | ICD-10-CM | POA: Diagnosis not present

## 2023-07-23 DIAGNOSIS — I2510 Atherosclerotic heart disease of native coronary artery without angina pectoris: Secondary | ICD-10-CM | POA: Diagnosis not present

## 2023-07-23 DIAGNOSIS — K8020 Calculus of gallbladder without cholecystitis without obstruction: Secondary | ICD-10-CM | POA: Diagnosis not present

## 2023-07-23 DIAGNOSIS — R140 Abdominal distension (gaseous): Secondary | ICD-10-CM | POA: Diagnosis present

## 2023-07-23 LAB — COMPREHENSIVE METABOLIC PANEL
ALT: 28 U/L (ref 0–44)
AST: 37 U/L (ref 15–41)
Albumin: 4.2 g/dL (ref 3.5–5.0)
Alkaline Phosphatase: 15 U/L — ABNORMAL LOW (ref 38–126)
Anion gap: 16 — ABNORMAL HIGH (ref 5–15)
BUN: 18 mg/dL (ref 8–23)
CO2: 20 mmol/L — ABNORMAL LOW (ref 22–32)
Calcium: 9.3 mg/dL (ref 8.9–10.3)
Chloride: 103 mmol/L (ref 98–111)
Creatinine, Ser: 1.24 mg/dL (ref 0.61–1.24)
GFR, Estimated: >60 mL/min (ref >60)
Glucose, Bld: 152 mg/dL — ABNORMAL HIGH (ref 70–99)
Potassium: 3.7 mmol/L (ref 3.5–5.1)
Sodium: 139 mmol/L (ref 135–145)
Total Bilirubin: 1.9 mg/dL — ABNORMAL HIGH (ref 0.0–1.2)
Total Protein: 6.9 g/dL (ref 6.5–8.1)

## 2023-07-23 LAB — CBC
HCT: 38.4 % — ABNORMAL LOW (ref 39.0–52.0)
Hemoglobin: 12.7 g/dL — ABNORMAL LOW (ref 13.0–17.0)
MCH: 32.9 pg (ref 26.0–34.0)
MCHC: 33.1 g/dL (ref 30.0–36.0)
MCV: 99.5 fL (ref 80.0–100.0)
Platelets: 228 10*3/uL (ref 150–400)
RBC: 3.86 MIL/uL — ABNORMAL LOW (ref 4.22–5.81)
RDW: 13.3 % (ref 11.5–15.5)
WBC: 9.4 10*3/uL (ref 4.0–10.5)
nRBC: 0 % (ref 0.0–0.2)

## 2023-07-23 LAB — LIPASE, BLOOD: Lipase: 38 U/L (ref 11–51)

## 2023-07-23 NOTE — ED Triage Notes (Signed)
 Pt sts that he is having N/V/D since 1200 today. Pt sts that he was hospitalized with a SBO. Pt sts that he was advised that he was to come back if he has the same thing again.

## 2023-07-24 ENCOUNTER — Emergency Department: Payer: PPO

## 2023-07-24 ENCOUNTER — Emergency Department
Admission: EM | Admit: 2023-07-24 | Discharge: 2023-07-24 | Disposition: A | Discharge status: Home / Self Care | Payer: PPO | Attending: Emergency Medicine | Admitting: Emergency Medicine

## 2023-07-24 DIAGNOSIS — N200 Calculus of kidney: Secondary | ICD-10-CM | POA: Diagnosis not present

## 2023-07-24 DIAGNOSIS — K56609 Unspecified intestinal obstruction, unspecified as to partial versus complete obstruction: Secondary | ICD-10-CM

## 2023-07-24 DIAGNOSIS — K5730 Diverticulosis of large intestine without perforation or abscess without bleeding: Secondary | ICD-10-CM | POA: Diagnosis not present

## 2023-07-24 DIAGNOSIS — K8020 Calculus of gallbladder without cholecystitis without obstruction: Secondary | ICD-10-CM | POA: Diagnosis not present

## 2023-07-24 LAB — URINALYSIS, ROUTINE W REFLEX MICROSCOPIC
Bacteria, UA: NONE SEEN
Bilirubin Urine: NEGATIVE
Glucose, UA: NEGATIVE mg/dL
Ketones, ur: NEGATIVE mg/dL
Leukocytes,Ua: NEGATIVE
Nitrite: NEGATIVE
Protein, ur: NEGATIVE mg/dL
Specific Gravity, Urine: >1.046 — ABNORMAL HIGH (ref 1.005–1.030)
Squamous Epithelial / HPF: 0 /HPF (ref 0–5)
pH: 6 (ref 5.0–8.0)

## 2023-07-24 MED ORDER — SODIUM CHLORIDE 0.9 % IV SOLN: INTRAVENOUS | Status: DC

## 2023-07-24 MED ORDER — FENTANYL CITRATE PF 50 MCG/ML IJ SOSY
50.0000 ug | PREFILLED_SYRINGE | Freq: Once | INTRAMUSCULAR | Status: DC
Start: 2023-07-24 — End: 2023-07-24

## 2023-07-24 MED ORDER — IOHEXOL 300 MG/ML  SOLN
100.0000 mL | Freq: Once | INTRAMUSCULAR | Status: AC | PRN
  Administered 2023-07-24: 100 mL via INTRAVENOUS

## 2023-07-24 MED ORDER — ONDANSETRON HCL 4 MG/2ML IJ SOLN: 4.0000 mg | Freq: Once | INTRAMUSCULAR | Status: DC

## 2023-07-24 NOTE — ED Provider Notes (Signed)
 [facility] Provider Note    Event Date/Time   First MD Initiated Contact with Patient 07/24/23 0112     (approximate)   History   Abdominal Pain   HPI  Ruben [name] is a 78 y.o. male with history of CAD status post CABG, A-fib on Eliquis , TIA, previous bowel obstruction who presents to the emergency department complaints of abdominal pain, distention that started this morning.  Reports symptoms have now completely resolved since being in the waiting room.  He did have vomiting but is now able to tolerate p.o.  Is having bowel movements and passing gas.  Does report thinking that he passed stones in his urine but no dysuria or hematuria.  No known history of kidney stones but does have history of gallstones.  Patient has had partial colectomy and appendectomy.  He reports doing bowel rest at home since this morning when symptoms started.   History provided by patient, wife.    Past Medical History:  Diagnosis Date   TIA (transient ischemic attack)     Past Surgical History:  Procedure Laterality Date   CLIPPING OF ATRIAL APPENDAGE  10/05/2022   Procedure: CLIPPING OF ATRIAL APPENDAGE;  Surgeon: Lucas Dorise POUR, MD;  Location: [facility] OR;  Service: Open Heart Surgery;;   CORONARY ARTERY BYPASS GRAFT N/A 10/05/2022   Procedure: CORONARY ARTERY BYPASS GRAFTING (CABG) X THREE BYPASSES USING OPEN LEFT INTERNAL MAMMARY ARTERY AND ENDOSCOPIC RIGHT GREATER SAPHENOUS VEIN HARVEST; CLIPPING OF ATRIAL APPENDAGE WITH ATRICLIP;  Surgeon: Lucas Dorise POUR, MD;  Location: [facility] OR;  Service: Open Heart Surgery;  Laterality: N/A;   LEFT HEART CATH AND CORONARY ANGIOGRAPHY N/A 10/02/2022   Procedure: LEFT HEART CATH AND CORONARY ANGIOGRAPHY;  Surgeon: Florencio Cara JONETTA, MD;  Location: [facility] INVASIVE CV LAB;  Service: Cardiovascular;  Laterality: N/A;   SHOULDER ARTHROSCOPY     TEE WITHOUT CARDIOVERSION N/A 10/05/2022   Procedure: TRANSESOPHAGEAL ECHOCARDIOGRAM;  Surgeon:  Lucas Dorise POUR, MD;  Location: [facility] OR;  Service: Open Heart Surgery;  Laterality: N/A;    MEDICATIONS:  Prior to Admission medications   Medication Sig Start Date End Date Taking? Authorizing Provider  amiodarone  (PACERONE ) 200 MG tablet Take 1 tablet (200 mg total) by mouth 2 (two) times daily. For 7 days then reduce the dose to 1 tablet (200 mg) twice daily. 10/11/22   Gold, Wayne E, PA-C  apixaban  (ELIQUIS ) 5 MG TABS tablet Take 1 tablet (5 mg total) by mouth 2 (two) times daily. 10/11/22   Gold, Lemond BRAVO, PA-C  aspirin  EC 81 MG tablet Take 1 tablet (81 mg total) by mouth daily. Swallow whole. 10/11/22   Gold, Wayne E, PA-C  cholecalciferol  (VITAMIN D3) 25 MCG (1000 UNIT) tablet Take 1,000 Units by mouth daily.    [provider]  metoprolol  succinate (TOPROL -XL) 25 MG 24 hr tablet Take 1 tablet (25 mg total) by mouth daily. 10/20/22   Jadine Toribio SQUIBB, MD  oxyCODONE  (OXY IR/ROXICODONE ) 5 MG immediate release tablet Take 1 tablet (5 mg total) by mouth every 8 (eight) hours as needed for severe pain (pain score 7-10), breakthrough pain or moderate pain (pain score 4-6). 06/11/23   Patel, Sona, MD  Polyvinyl Alcohol-Povidone (REFRESH OP) Place 1 drop into both eyes as needed (dry eye).    [provider]  rosuvastatin  (CRESTOR ) 20 MG tablet Take 2 tablets (40 mg total) by mouth at bedtime. 10/02/22 10/02/23  Callwood, Dwayne D, MD  senna-docusate (SENOKOT-S) 8.6-50 MG  tablet Take 1 tablet by mouth 2 (two) times daily. 06/11/23   Patel, Sona, MD  vitamin B-12 (CYANOCOBALAMIN ) 1000 MCG tablet Take 1,000 mcg by mouth daily.    [provider]  vitamin C  (ASCORBIC ACID ) 250 MG tablet Take 250 mg by mouth daily.    [provider]    Physical Exam   Triage Vital Signs: ED Triage Vitals [07/23/23 2040]  Encounter Vitals Group     BP (!) 142/73     Systolic BP Percentile      Diastolic BP Percentile      Pulse Rate 71     Resp 18     Temp 98.7 F (37.1 C)      Temp Source Oral     SpO2 100 %     Weight 135 lb (61.2 kg)     Height 5' 8 (1.727 m)     Head Circumference      Peak Flow      Pain Score 9     Pain Loc      Pain Education      Exclude from Growth Chart     Most recent vital signs: Vitals:   07/24/23 0200 07/24/23 0230  BP: (!) 144/66 138/66  Pulse: 71 69  Resp:    Temp:    SpO2: 100% 100%    CONSTITUTIONAL: Alert, responds appropriately to questions. Well-appearing; well-nourished HEAD: Normocephalic, atraumatic EYES: Conjunctivae clear, pupils appear equal, sclera nonicteric ENT: normal nose; moist mucous membranes NECK: Supple, normal ROM CARD: RRR; S1 and S2 appreciated RESP: Normal chest excursion without splinting or tachypnea; breath sounds clear and equal bilaterally; no wheezes, no rhonchi, no rales, no hypoxia or respiratory distress, speaking full sentences ABD/GI: Non-distended; soft, non-tender, no rebound, no guarding, no peritoneal signs BACK: The back appears normal EXT: Normal ROM in all joints; no deformity noted, no edema SKIN: Normal color for age and race; warm; no rash on exposed skin NEURO: Moves all extremities equally, normal speech PSYCH: The patient's mood and manner are appropriate.   ED Results / Procedures / Treatments   LABS: (all labs ordered are listed, but only abnormal results are displayed) Labs Reviewed  COMPREHENSIVE METABOLIC PANEL - Abnormal; Notable for the following components:      Result Value   CO2 20 (*)    Glucose, Bld 152 (*)    Alkaline Phosphatase 15 (*)    Total Bilirubin 1.9 (*)    Anion gap 16 (*)    All other components within normal limits  CBC - Abnormal; Notable for the following components:   RBC 3.86 (*)    Hemoglobin 12.7 (*)    HCT 38.4 (*)    All other components within normal limits  URINALYSIS, ROUTINE W REFLEX MICROSCOPIC - Abnormal; Notable for the following components:   Color, Urine YELLOW (*)    APPearance CLEAR (*)    Specific Gravity,  Urine >1.046 (*)    Hgb urine dipstick SMALL (*)    All other components within normal limits  LIPASE, BLOOD     EKG:   RADIOLOGY: My personal review and interpretation of imaging: CT scan showed possible developing small bowel obstruction   I have personally reviewed all radiology reports.   CT ABDOMEN PELVIS W CONTRAST Result Date: 07/24/2023 CLINICAL DATA:  Nausea, vomiting, diarrhea. Previously hospitalized with small-bowel obstruction. EXAM: CT ABDOMEN AND PELVIS WITH CONTRAST TECHNIQUE: Multidetector CT imaging of the abdomen and pelvis was performed using the standard protocol  following bolus administration of intravenous contrast. RADIATION DOSE REDUCTION: This exam was performed according to the departmental dose-optimization program which includes automated exposure control, adjustment of the mA and/or kV according to patient size and/or use of iterative reconstruction technique. CONTRAST:  OMNIPAQUE  IOHEXOL  300 MG/ML  SOLN COMPARISON:  CT abdomen and pelvis 06/07/2023 FINDINGS: Lower chest: No acute abnormality. Hepatobiliary: Hepatic steatosis. Unchanged hyperenhancing lesion in the right hepatic lobe measuring 1.6 cm and favored to represent a flash filling hemangioma. Cholelithiasis without evidence of acute cholecystitis. No biliary dilation. Pancreas: Unremarkable. Spleen: Unremarkable. Adrenals/Urinary Tract: Normal adrenal glands. Nonobstructing left nephrolithiasis. No hydronephrosis. Nondistended thick-walled bladder. Stomach/Bowel: Gastric diverticulum. Stomach is otherwise within normal limits. Normal caliber colon. Colonic diverticulosis without diverticulitis. The appendix is not visualized. Postoperative change of small-bowel resection with anastomosis in the low central abdomen. The small bowel immediately proximal to the suture line is dilated up to 4.9 cm. There is abrupt tapering at the suture line (series 5/image 36). Mild adjacent stranding. Vascular/Lymphatic:  Aortic atherosclerosis. No enlarged abdominal or pelvic lymph nodes. Reproductive: No acute abnormality. Other: Small volume free fluid in the pelvis. No free intraperitoneal air. Musculoskeletal: No acute fracture. IMPRESSION: 1. Dilated small bowel immediately upstream from the small bowel-small bowel anastomosis. This may be due to developing or partial obstruction versus chronic patulous anastomosis. 2. Small volume free fluid in the pelvis. 3. Hepatic steatosis. 4. Cholelithiasis without evidence of acute cholecystitis. 5. Nonobstructing left nephrolithiasis. Aortic Atherosclerosis (ICD10-I70.0). Electronically Signed   By: Norman Gatlin M.D.   On: 07/24/2023 01:17   DG Abdomen 1 View Result Date: 07/23/2023 CLINICAL DATA:  Abdominal pain EXAM: ABDOMEN - 1 VIEW COMPARISON:  CT 06/07/2023.  Plain films 06/09/2023 FINDINGS: Dilated upper and mid abdominal small bowel loops concerning for small bowel obstruction. No free air, organomegaly. Visualized lung bases clear. IMPRESSION: Dilated small bowel loops in the upper and mid abdomen concerning for small bowel obstruction. Electronically Signed   By: Franky Crease M.D.   On: 07/23/2023 22:10     PROCEDURES:  Critical Care performed: No    Procedures    IMPRESSION / MDM / ASSESSMENT AND PLAN / ED COURSE  I reviewed the triage vital signs and the nursing notes.    Patient here with possible developing partial small bowel obstruction that has now resolved clinically.    DIFFERENTIAL DIAGNOSIS (includes but not limited to):   Developing bowel obstruction, UTI, colitis, diverticulitis, kidney stone   Patient's presentation is most consistent with acute presentation with potential threat to life or bodily function.   PLAN: Patient's workup initiated from triage.  No leukocytosis.  Chronically elevated total bilirubin but otherwise normal LFTs.  Normal creatinine.  X-ray of the abdomen was obtained from triage and showed possible bowel  obstruction.  CT scan ordered.  CT scan reviewed and interpreted by myself and shows possible developing partial small bowel obstruction.  He has cholelithiasis without cholecystitis and no tenderness in the right upper abdomen.  Reports most of his pain is in the lower abdomen and felt similar to his previous bowel obstructions.  He has been doing bowel rest at home and reports now his symptoms have completely resolved.  He is not having any pain and his abdominal distention has resolved.  He is not vomiting now and is tolerating p.o.  He reports he is passing gas and having bowel movements.  Will obtain urinalysis just to make sure there is no sign of UTI today given lower abdominal  pain but anticipate patient can be discharged if he continues to do well here.   MEDICATIONS GIVEN IN ED: Medications  iohexol  (OMNIPAQUE ) 300 MG/ML solution 100 mL (100 mLs Intravenous Contrast Given 07/24/23 0052)     ED COURSE: Urine shows no blood or sign of infection.  Patient continues to feel well here with benign exam.  Do suspect partial bowel obstruction that has clinically resolved.  I feel he is safe for discharge.  Patient and wife are comfortable with this plan.  We discussed at length return precautions.  Will discharge home.  He does have a PCP for follow-up.   At this time, I do not feel there is any life-threatening condition present. I reviewed all nursing notes, vitals, pertinent previous records.  All lab and urine results, EKGs, imaging ordered have been independently reviewed and interpreted by myself.  I reviewed all available radiology reports from any imaging ordered this visit.  Based on my assessment, I feel the patient is safe to be discharged home without further emergent workup and can continue workup as an outpatient as needed. Discussed all findings, treatment plan as well as usual and customary return precautions.  They verbalize understanding and are comfortable with this plan.  Outpatient  follow-up has been provided as needed.  All questions have been answered.    CONSULTS:  none   OUTSIDE RECORDS REVIEWED: Reviewed last admission in November 2024.       FINAL CLINICAL IMPRESSION(S) / ED DIAGNOSES   Final diagnoses:  SBO (small bowel obstruction) (HCC)     Rx / DC Orders   ED Discharge Orders     None        Note:  This document was prepared using Dragon voice recognition software and may include unintentional dictation errors.   Solstice Lastinger, Josette SAILOR, DO 07/24/23 [phone]

## 2023-07-24 NOTE — Discharge Instructions (Addendum)
 Your CT scan showed developing partial small bowel obstruction which clinically I feel like has resolved given your distention is now gone, your pain has resolved and you are no longer vomiting.  If you begin developing symptoms of abdominal pain, abdominal distention, unable to have bowel movements or pass gas, vomiting, fever or any other symptom concerning to you, please return to the emergency department.

## 2023-08-06 DIAGNOSIS — E7800 Pure hypercholesterolemia, unspecified: Secondary | ICD-10-CM | POA: Diagnosis not present

## 2023-08-06 DIAGNOSIS — Z79899 Other long term (current) drug therapy: Secondary | ICD-10-CM | POA: Diagnosis not present

## 2023-08-13 DIAGNOSIS — I2510 Atherosclerotic heart disease of native coronary artery without angina pectoris: Secondary | ICD-10-CM | POA: Diagnosis not present

## 2023-08-13 DIAGNOSIS — I480 Paroxysmal atrial fibrillation: Secondary | ICD-10-CM | POA: Diagnosis not present

## 2023-08-13 DIAGNOSIS — I10 Essential (primary) hypertension: Secondary | ICD-10-CM | POA: Diagnosis not present

## 2023-08-13 DIAGNOSIS — D649 Anemia, unspecified: Secondary | ICD-10-CM | POA: Diagnosis not present

## 2023-08-13 DIAGNOSIS — N1831 Chronic kidney disease, stage 3a: Secondary | ICD-10-CM | POA: Diagnosis not present

## 2023-08-13 DIAGNOSIS — Z79899 Other long term (current) drug therapy: Secondary | ICD-10-CM | POA: Diagnosis not present

## 2023-08-13 DIAGNOSIS — E7800 Pure hypercholesterolemia, unspecified: Secondary | ICD-10-CM | POA: Diagnosis not present

## 2023-08-20 ENCOUNTER — Other Ambulatory Visit: Payer: Self-pay | Admitting: Surgical

## 2023-08-21 DIAGNOSIS — Z03818 Encounter for observation for suspected exposure to other biological agents ruled out: Secondary | ICD-10-CM | POA: Diagnosis not present

## 2023-08-21 DIAGNOSIS — J209 Acute bronchitis, unspecified: Secondary | ICD-10-CM | POA: Diagnosis not present

## 2023-08-21 DIAGNOSIS — B9689 Other specified bacterial agents as the cause of diseases classified elsewhere: Secondary | ICD-10-CM | POA: Diagnosis not present

## 2023-08-21 DIAGNOSIS — J101 Influenza due to other identified influenza virus with other respiratory manifestations: Secondary | ICD-10-CM | POA: Diagnosis not present

## 2023-08-21 DIAGNOSIS — J019 Acute sinusitis, unspecified: Secondary | ICD-10-CM | POA: Diagnosis not present

## 2023-09-30 ENCOUNTER — Other Ambulatory Visit: Payer: Self-pay | Admitting: Surgical

## 2023-10-12 DIAGNOSIS — E7800 Pure hypercholesterolemia, unspecified: Secondary | ICD-10-CM | POA: Diagnosis not present

## 2023-10-12 DIAGNOSIS — I480 Paroxysmal atrial fibrillation: Secondary | ICD-10-CM | POA: Diagnosis not present

## 2023-10-12 DIAGNOSIS — I25721 Atherosclerosis of autologous artery coronary artery bypass graft(s) with angina pectoris with documented spasm: Secondary | ICD-10-CM | POA: Diagnosis not present

## 2023-10-12 DIAGNOSIS — G459 Transient cerebral ischemic attack, unspecified: Secondary | ICD-10-CM | POA: Diagnosis not present

## 2023-10-12 DIAGNOSIS — I214 Non-ST elevation (NSTEMI) myocardial infarction: Secondary | ICD-10-CM | POA: Diagnosis not present

## 2023-10-15 DIAGNOSIS — Z85810 Personal history of malignant neoplasm of tongue: Secondary | ICD-10-CM | POA: Diagnosis not present

## 2023-10-15 DIAGNOSIS — R682 Dry mouth, unspecified: Secondary | ICD-10-CM | POA: Diagnosis not present

## 2023-10-15 DIAGNOSIS — J300 Vasomotor rhinitis: Secondary | ICD-10-CM | POA: Diagnosis not present

## 2023-11-02 DIAGNOSIS — R140 Abdominal distension (gaseous): Secondary | ICD-10-CM | POA: Diagnosis not present

## 2023-11-02 DIAGNOSIS — R1033 Periumbilical pain: Secondary | ICD-10-CM | POA: Diagnosis not present

## 2023-11-02 DIAGNOSIS — Z8719 Personal history of other diseases of the digestive system: Secondary | ICD-10-CM | POA: Diagnosis not present

## 2024-02-03 DIAGNOSIS — E7800 Pure hypercholesterolemia, unspecified: Secondary | ICD-10-CM | POA: Diagnosis not present

## 2024-02-03 DIAGNOSIS — D631 Anemia in chronic kidney disease: Secondary | ICD-10-CM | POA: Diagnosis not present

## 2024-02-03 DIAGNOSIS — Z79899 Other long term (current) drug therapy: Secondary | ICD-10-CM | POA: Diagnosis not present

## 2024-02-03 DIAGNOSIS — N1831 Chronic kidney disease, stage 3a: Secondary | ICD-10-CM | POA: Diagnosis not present

## 2024-02-10 DIAGNOSIS — I480 Paroxysmal atrial fibrillation: Secondary | ICD-10-CM | POA: Diagnosis not present

## 2024-02-10 DIAGNOSIS — D649 Anemia, unspecified: Secondary | ICD-10-CM | POA: Diagnosis not present

## 2024-02-10 DIAGNOSIS — Z0000 Encounter for general adult medical examination without abnormal findings: Secondary | ICD-10-CM | POA: Diagnosis not present

## 2024-02-10 DIAGNOSIS — Z1331 Encounter for screening for depression: Secondary | ICD-10-CM | POA: Diagnosis not present

## 2024-02-10 DIAGNOSIS — N1831 Chronic kidney disease, stage 3a: Secondary | ICD-10-CM | POA: Diagnosis not present

## 2024-02-10 DIAGNOSIS — E7800 Pure hypercholesterolemia, unspecified: Secondary | ICD-10-CM | POA: Diagnosis not present

## 2024-02-10 DIAGNOSIS — I2510 Atherosclerotic heart disease of native coronary artery without angina pectoris: Secondary | ICD-10-CM | POA: Diagnosis not present

## 2024-02-10 DIAGNOSIS — Z79899 Other long term (current) drug therapy: Secondary | ICD-10-CM | POA: Diagnosis not present

## 2024-03-02 DIAGNOSIS — I25721 Atherosclerosis of autologous artery coronary artery bypass graft(s) with angina pectoris with documented spasm: Secondary | ICD-10-CM | POA: Diagnosis not present

## 2024-03-02 DIAGNOSIS — E7800 Pure hypercholesterolemia, unspecified: Secondary | ICD-10-CM | POA: Diagnosis not present

## 2024-03-02 DIAGNOSIS — Z951 Presence of aortocoronary bypass graft: Secondary | ICD-10-CM | POA: Diagnosis not present

## 2024-03-02 DIAGNOSIS — R000 Tachycardia, unspecified: Secondary | ICD-10-CM | POA: Diagnosis not present

## 2024-03-02 DIAGNOSIS — I482 Chronic atrial fibrillation, unspecified: Secondary | ICD-10-CM | POA: Diagnosis not present

## 2024-03-02 DIAGNOSIS — R29818 Other symptoms and signs involving the nervous system: Secondary | ICD-10-CM | POA: Diagnosis not present

## 2024-03-02 DIAGNOSIS — I480 Paroxysmal atrial fibrillation: Secondary | ICD-10-CM | POA: Diagnosis not present

## 2024-03-02 DIAGNOSIS — I214 Non-ST elevation (NSTEMI) myocardial infarction: Secondary | ICD-10-CM | POA: Diagnosis not present

## 2024-03-02 DIAGNOSIS — R0602 Shortness of breath: Secondary | ICD-10-CM | POA: Diagnosis not present

## 2024-03-02 DIAGNOSIS — G459 Transient cerebral ischemic attack, unspecified: Secondary | ICD-10-CM | POA: Diagnosis not present

## 2024-04-27 DIAGNOSIS — M5442 Lumbago with sciatica, left side: Secondary | ICD-10-CM | POA: Diagnosis not present

## 2024-04-27 DIAGNOSIS — I480 Paroxysmal atrial fibrillation: Secondary | ICD-10-CM | POA: Diagnosis not present

## 2024-04-27 DIAGNOSIS — Z7901 Long term (current) use of anticoagulants: Secondary | ICD-10-CM | POA: Diagnosis not present

## 2024-04-27 DIAGNOSIS — G8929 Other chronic pain: Secondary | ICD-10-CM | POA: Diagnosis not present

## 2024-04-27 DIAGNOSIS — M5441 Lumbago with sciatica, right side: Secondary | ICD-10-CM | POA: Diagnosis not present
